# Patient Record
Sex: Female | Born: 1945 | Race: White | Hispanic: No | Smoking: Never smoker
Health system: Southern US, Community
[De-identification: ages and names within clinical notes are randomized; demographics above are authoritative.]

## PROBLEM LIST (undated history)

## (undated) DIAGNOSIS — N6002 Solitary cyst of left breast: Secondary | ICD-10-CM

## (undated) DIAGNOSIS — R2681 Unsteadiness on feet: Secondary | ICD-10-CM

## (undated) DIAGNOSIS — M549 Dorsalgia, unspecified: Secondary | ICD-10-CM

## (undated) DIAGNOSIS — I10 Essential (primary) hypertension: Secondary | ICD-10-CM

## (undated) DIAGNOSIS — H9319 Tinnitus, unspecified ear: Secondary | ICD-10-CM

## (undated) DIAGNOSIS — R9389 Abnormal findings on diagnostic imaging of other specified body structures: Secondary | ICD-10-CM

## (undated) DIAGNOSIS — M5432 Sciatica, left side: Secondary | ICD-10-CM

## (undated) DIAGNOSIS — R110 Nausea: Secondary | ICD-10-CM

## (undated) DIAGNOSIS — K5790 Diverticulosis of intestine, part unspecified, without perforation or abscess without bleeding: Secondary | ICD-10-CM

## (undated) DIAGNOSIS — K224 Dyskinesia of esophagus: Secondary | ICD-10-CM

## (undated) DIAGNOSIS — K2970 Gastritis, unspecified, without bleeding: Secondary | ICD-10-CM

## (undated) DIAGNOSIS — M810 Age-related osteoporosis without current pathological fracture: Secondary | ICD-10-CM

## (undated) DIAGNOSIS — R519 Headache, unspecified: Secondary | ICD-10-CM

## (undated) HISTORY — DX: Headache, unspecified: R51.9

## (undated) HISTORY — DX: Sciatica, left side: M54.32

## (undated) HISTORY — DX: Nausea: R11.0

## (undated) HISTORY — DX: Gastritis, unspecified, without bleeding: K29.70

## (undated) HISTORY — PX: ABDOMINAL HYSTERECTOMY: SHX81

## (undated) HISTORY — DX: Unsteadiness on feet: R26.81

## (undated) HISTORY — DX: Tinnitus, unspecified ear: H93.19

## (undated) HISTORY — DX: Abnormal findings on diagnostic imaging of other specified body structures: R93.89

## (undated) HISTORY — DX: Dorsalgia, unspecified: M54.9

## (undated) HISTORY — DX: Dyskinesia of esophagus: K22.4

## (undated) HISTORY — PX: ESOPHAGEAL MANOMETRY: SHX1526

## (undated) HISTORY — PX: FLEXIBLE SIGMOIDOSCOPY: SHX1649

## (undated) HISTORY — DX: Age-related osteoporosis without current pathological fracture: M81.0

## (undated) HISTORY — DX: Diverticulosis of intestine, part unspecified, without perforation or abscess without bleeding: K57.90

## (undated) HISTORY — DX: Solitary cyst of left breast: N60.02

## (undated) HISTORY — PX: SHOULDER SURGERY: SHX246

## (undated) HISTORY — PX: FOOT SURGERY: SHX648

---

## 1999-10-10 ENCOUNTER — Encounter: Admission: RE | Admit: 1999-10-10 | Discharge: 1999-10-10 | Payer: Self-pay | Admitting: Obstetrics and Gynecology

## 1999-10-10 ENCOUNTER — Encounter: Payer: Self-pay | Admitting: Obstetrics and Gynecology

## 2000-05-23 ENCOUNTER — Other Ambulatory Visit: Admission: RE | Admit: 2000-05-23 | Discharge: 2000-05-23 | Payer: Self-pay | Admitting: Obstetrics and Gynecology

## 2000-06-29 ENCOUNTER — Encounter: Admission: RE | Admit: 2000-06-29 | Discharge: 2000-06-29 | Payer: Self-pay | Admitting: Family Medicine

## 2000-10-12 ENCOUNTER — Encounter: Payer: Self-pay | Admitting: Obstetrics and Gynecology

## 2000-10-12 ENCOUNTER — Encounter: Admission: RE | Admit: 2000-10-12 | Discharge: 2000-10-12 | Payer: Self-pay | Admitting: Obstetrics and Gynecology

## 2000-11-20 ENCOUNTER — Encounter: Admission: RE | Admit: 2000-11-20 | Discharge: 2000-11-20 | Payer: Self-pay | Admitting: Family Medicine

## 2001-05-27 ENCOUNTER — Other Ambulatory Visit: Admission: RE | Admit: 2001-05-27 | Discharge: 2001-05-27 | Payer: Self-pay | Admitting: Obstetrics and Gynecology

## 2001-09-20 ENCOUNTER — Encounter: Payer: Self-pay | Admitting: Gastroenterology

## 2001-09-20 ENCOUNTER — Ambulatory Visit (HOSPITAL_COMMUNITY): Admission: RE | Admit: 2001-09-20 | Discharge: 2001-09-20 | Payer: Self-pay | Admitting: Gastroenterology

## 2001-10-14 ENCOUNTER — Encounter: Payer: Self-pay | Admitting: Obstetrics and Gynecology

## 2001-10-14 ENCOUNTER — Encounter: Admission: RE | Admit: 2001-10-14 | Discharge: 2001-10-14 | Payer: Self-pay | Admitting: Obstetrics and Gynecology

## 2001-10-22 ENCOUNTER — Encounter (INDEPENDENT_AMBULATORY_CARE_PROVIDER_SITE_OTHER): Payer: Self-pay | Admitting: *Deleted

## 2001-10-22 ENCOUNTER — Ambulatory Visit (HOSPITAL_BASED_OUTPATIENT_CLINIC_OR_DEPARTMENT_OTHER): Admission: RE | Admit: 2001-10-22 | Discharge: 2001-10-22 | Payer: Self-pay | Admitting: *Deleted

## 2001-10-28 ENCOUNTER — Ambulatory Visit (HOSPITAL_COMMUNITY): Admission: RE | Admit: 2001-10-28 | Discharge: 2001-10-28 | Payer: Self-pay | Admitting: Gastroenterology

## 2002-10-16 ENCOUNTER — Encounter: Payer: Self-pay | Admitting: Obstetrics and Gynecology

## 2002-10-16 ENCOUNTER — Encounter: Admission: RE | Admit: 2002-10-16 | Discharge: 2002-10-16 | Payer: Self-pay | Admitting: Obstetrics and Gynecology

## 2003-05-07 ENCOUNTER — Ambulatory Visit (HOSPITAL_COMMUNITY): Admission: RE | Admit: 2003-05-07 | Discharge: 2003-05-07 | Payer: Self-pay | Admitting: Gastroenterology

## 2003-05-07 ENCOUNTER — Encounter: Payer: Self-pay | Admitting: Gastroenterology

## 2003-06-22 ENCOUNTER — Ambulatory Visit (HOSPITAL_COMMUNITY): Admission: RE | Admit: 2003-06-22 | Discharge: 2003-06-22 | Payer: Self-pay | Admitting: Gastroenterology

## 2003-06-22 ENCOUNTER — Encounter: Payer: Self-pay | Admitting: Gastroenterology

## 2003-10-27 ENCOUNTER — Encounter: Admission: RE | Admit: 2003-10-27 | Discharge: 2003-10-27 | Payer: Self-pay | Admitting: Obstetrics and Gynecology

## 2004-12-29 ENCOUNTER — Ambulatory Visit (HOSPITAL_COMMUNITY): Admission: RE | Admit: 2004-12-29 | Discharge: 2004-12-29 | Payer: Self-pay | Admitting: Obstetrics and Gynecology

## 2006-01-07 ENCOUNTER — Emergency Department (HOSPITAL_COMMUNITY): Admission: EM | Admit: 2006-01-07 | Discharge: 2006-01-08 | Payer: Self-pay | Admitting: *Deleted

## 2006-01-08 ENCOUNTER — Inpatient Hospital Stay (HOSPITAL_COMMUNITY): Admission: AD | Admit: 2006-01-08 | Discharge: 2006-01-10 | Payer: Self-pay | Admitting: Gastroenterology

## 2006-01-08 ENCOUNTER — Ambulatory Visit: Payer: Self-pay | Admitting: Internal Medicine

## 2006-01-08 ENCOUNTER — Ambulatory Visit: Payer: Self-pay | Admitting: Gastroenterology

## 2006-01-22 ENCOUNTER — Ambulatory Visit: Payer: Self-pay | Admitting: Gastroenterology

## 2006-01-23 ENCOUNTER — Ambulatory Visit (HOSPITAL_COMMUNITY): Admission: RE | Admit: 2006-01-23 | Discharge: 2006-01-23 | Payer: Self-pay | Admitting: Gastroenterology

## 2006-01-23 ENCOUNTER — Encounter (INDEPENDENT_AMBULATORY_CARE_PROVIDER_SITE_OTHER): Payer: Self-pay | Admitting: *Deleted

## 2006-07-04 ENCOUNTER — Ambulatory Visit (HOSPITAL_COMMUNITY): Admission: RE | Admit: 2006-07-04 | Discharge: 2006-07-04 | Payer: Self-pay | Admitting: Family Medicine

## 2006-09-27 ENCOUNTER — Encounter: Admission: RE | Admit: 2006-09-27 | Discharge: 2006-09-27 | Payer: Self-pay | Admitting: Obstetrics and Gynecology

## 2007-10-01 ENCOUNTER — Encounter: Admission: RE | Admit: 2007-10-01 | Discharge: 2007-10-01 | Payer: Self-pay | Admitting: Obstetrics and Gynecology

## 2008-10-05 ENCOUNTER — Encounter: Admission: RE | Admit: 2008-10-05 | Discharge: 2008-10-05 | Payer: Self-pay | Admitting: Obstetrics and Gynecology

## 2009-06-24 ENCOUNTER — Ambulatory Visit (HOSPITAL_COMMUNITY): Admission: RE | Admit: 2009-06-24 | Discharge: 2009-06-24 | Payer: Self-pay | Admitting: Family Medicine

## 2009-06-28 ENCOUNTER — Ambulatory Visit (HOSPITAL_COMMUNITY): Admission: RE | Admit: 2009-06-28 | Discharge: 2009-06-29 | Payer: Self-pay | Admitting: Family Medicine

## 2009-06-30 ENCOUNTER — Encounter (HOSPITAL_COMMUNITY): Admission: RE | Admit: 2009-06-30 | Discharge: 2009-07-30 | Payer: Self-pay | Admitting: Family Medicine

## 2009-06-30 ENCOUNTER — Ambulatory Visit (HOSPITAL_COMMUNITY): Payer: Self-pay | Admitting: Family Medicine

## 2009-07-01 ENCOUNTER — Ambulatory Visit: Payer: Self-pay | Admitting: Cardiology

## 2009-09-29 ENCOUNTER — Encounter (HOSPITAL_COMMUNITY): Admission: RE | Admit: 2009-09-29 | Discharge: 2009-10-29 | Payer: Self-pay | Admitting: Family Medicine

## 2009-09-29 ENCOUNTER — Ambulatory Visit (HOSPITAL_COMMUNITY): Payer: Self-pay | Admitting: Family Medicine

## 2009-10-06 ENCOUNTER — Encounter: Admission: RE | Admit: 2009-10-06 | Discharge: 2009-10-06 | Payer: Self-pay | Admitting: Obstetrics and Gynecology

## 2009-12-22 ENCOUNTER — Encounter (HOSPITAL_COMMUNITY): Admission: RE | Admit: 2009-12-22 | Discharge: 2010-01-21 | Payer: Self-pay | Admitting: Family Medicine

## 2009-12-22 ENCOUNTER — Ambulatory Visit (HOSPITAL_COMMUNITY): Payer: Self-pay | Admitting: Family Medicine

## 2010-03-30 ENCOUNTER — Ambulatory Visit (HOSPITAL_COMMUNITY): Payer: Self-pay | Admitting: Family Medicine

## 2010-03-30 ENCOUNTER — Encounter (HOSPITAL_COMMUNITY): Admission: RE | Admit: 2010-03-30 | Discharge: 2010-04-29 | Payer: Self-pay | Admitting: Family Medicine

## 2010-06-29 ENCOUNTER — Encounter (HOSPITAL_COMMUNITY): Admission: RE | Admit: 2010-06-29 | Discharge: 2010-07-29 | Payer: Self-pay | Admitting: Family Medicine

## 2010-06-29 ENCOUNTER — Ambulatory Visit (HOSPITAL_COMMUNITY): Payer: Self-pay | Admitting: Family Medicine

## 2010-09-28 ENCOUNTER — Encounter (HOSPITAL_COMMUNITY)
Admission: RE | Admit: 2010-09-28 | Discharge: 2010-10-28 | Payer: Self-pay | Source: Home / Self Care | Admitting: Family Medicine

## 2010-09-28 ENCOUNTER — Ambulatory Visit (HOSPITAL_COMMUNITY): Payer: Self-pay | Admitting: Family Medicine

## 2010-10-26 ENCOUNTER — Encounter: Admission: RE | Admit: 2010-10-26 | Discharge: 2010-10-26 | Payer: Self-pay | Admitting: Obstetrics and Gynecology

## 2010-12-22 ENCOUNTER — Encounter (INDEPENDENT_AMBULATORY_CARE_PROVIDER_SITE_OTHER): Payer: Self-pay | Admitting: *Deleted

## 2010-12-28 ENCOUNTER — Encounter (HOSPITAL_COMMUNITY)
Admission: RE | Admit: 2010-12-28 | Discharge: 2011-01-10 | Payer: Self-pay | Source: Home / Self Care | Attending: Family Medicine | Admitting: Family Medicine

## 2010-12-28 ENCOUNTER — Ambulatory Visit (HOSPITAL_COMMUNITY)
Admission: RE | Admit: 2010-12-28 | Discharge: 2011-01-10 | Payer: Self-pay | Source: Home / Self Care | Attending: Family Medicine | Admitting: Family Medicine

## 2011-01-01 ENCOUNTER — Encounter: Payer: Self-pay | Admitting: Chiropractic Medicine

## 2011-01-12 NOTE — Letter (Signed)
Summary: Pre Visit Letter Revised  [facility]  [address] [place], [place] [address]   Phone: [phone]  Fax: [phone]        12/22/2010 MRN: [id number] Shelly Stein [address] [place], [place]  [address]             Procedure Date:  02/10/2011 @ 10:00   Recall colon-Dr. Juanda Chance   Welcome to the Gastroenterology Division at [facility].    You are scheduled to see a nurse for your pre-procedure visit on 01/26/2011 at 10:00 on the 3rd floor at [facility], [address]  We ask that you try to arrive at our office 15 minutes prior to your appointment time to allow for check-in.  Please take a minute to review the attached form.  If you answer "Yes" to one or more of the questions on the first page, we ask that you call the person listed at your earliest opportunity.  If you answer "No" to all of the questions, please complete the rest of the form and bring it to your appointment.    Your nurse visit will consist of discussing your medical and surgical history, your immediate family medical history, and your medications.   If you are unable to list all of your medications on the form, please bring the medication bottles to your appointment and we will list them.  We will need to be aware of both prescribed and over the counter drugs.  We will need to know exact dosage information as well.    Please be prepared to read and sign documents such as consent forms, a financial agreement, and acknowledgement forms.  If necessary, and with your consent, a friend or relative is welcome to sit-in on the nurse visit with you.  Please bring your insurance card so that we may make a copy of it.  If your insurance requires a referral to see a specialist, please bring your referral form from your primary care physician.  No co-pay is required for this nurse visit.     If you cannot keep your appointment, please call [phone] to cancel or reschedule prior to your  appointment date.  This allows [place] the opportunity to schedule an appointment for another patient in need of care.    Thank you for choosing [facility] for your medical needs.  We appreciate the opportunity to care for you.  Please visit [place] at our website  to learn more about our practice.  Sincerely, The Gastroenterology Division

## 2011-01-24 ENCOUNTER — Encounter (INDEPENDENT_AMBULATORY_CARE_PROVIDER_SITE_OTHER): Payer: Self-pay | Admitting: *Deleted

## 2011-01-26 ENCOUNTER — Encounter: Payer: Self-pay | Admitting: Internal Medicine

## 2011-02-01 NOTE — Miscellaneous (Signed)
Summary: [facility]  Clinical Lists Changes  Medications: Added new medication of MIRALAX   POWD (POLYETHYLENE GLYCOL 3350) As per prep  instructions. - Signed Added new medication of DULCOLAX 5 MG  TBEC (BISACODYL) Day before procedure take 2 at 3pm and 2 at 8pm. - Signed Added new medication of REGLAN 10 MG  TABS (METOCLOPRAMIDE HCL) As per prep instructions. - Signed Rx of MIRALAX   POWD (POLYETHYLENE GLYCOL 3350) As per prep  instructions.;  #255gm x 0;  Signed;  Entered by: Ezra Sites RN;  Authorized by: Hart Carwin MD;  Method used: Electronically to [facility] # [phone]*, [address] [place], [place]  [address], Ph: [phone] or [phone], Fax: [phone] Rx of DULCOLAX 5 MG  TBEC (BISACODYL) Day before procedure take 2 at 3pm and 2 at 8pm.;  #4 x 0;  Signed;  Entered by: Ezra Sites RN;  Authorized by: Hart Carwin MD;  Method used: Electronically to [facility] # [phone]*, [address], [place], [place]  [address], Ph: [phone] or [phone], Fax: [phone] Rx of REGLAN 10 MG  TABS (METOCLOPRAMIDE HCL) As per prep instructions.;  #2 x 0;  Signed;  Entered by: Ezra Sites RN;  Authorized by: Hart Carwin MD;  Method used: Electronically to [facility] # [phone]*, [address], [place], [place]  [address], Ph: [phone] or [phone], Fax: [phone] Observations: Added new observation of NKA: T (01/26/2011 9:48)    Prescriptions: REGLAN 10 MG  TABS (METOCLOPRAMIDE HCL) As per prep instructions.  #2 x 0   Entered by:   Ezra Sites RN   Authorized by:   Hart Carwin MD   Signed by:   Ezra Sites RN on 01/26/2011   Method used:   Electronically to        [facility] # [phone]* (retail)       [address]       [place], [place]  [address]       Ph: [phone] or [phone]       Fax: [phone]   RxID:   [id number] DULCOLAX 5 MG  TBEC (BISACODYL) Day before procedure take 2 at 3pm and 2 at 8pm.  #4 x 0  Entered by:   Ezra Sites RN   Authorized by:   Hart Carwin MD   Signed by:   Ezra Sites RN on 01/26/2011   Method used:   Electronically to        [facility] # [phone]* (retail)       [address]       [place], [place]  [address]       Ph: [phone] or [phone]       Fax: [phone]   RxID:   [id number] MIRALAX   POWD (POLYETHYLENE GLYCOL 3350) As per prep  instructions.  #255gm x 0   Entered by:   Ezra Sites RN   Authorized by:   Hart Carwin MD   Signed by:   Ezra Sites RN on 01/26/2011   Method used:   Electronically to        [facility] # [phone]* (retail)       [address]       [place], [place]  [address]  Ph: [phone] or [phone]       Fax: [phone]   RxID:   [id number]

## 2011-02-01 NOTE — Letter (Signed)
Summary: [facility] Instructions  [facility]  [address] [place], [place] [address]   Phone: [phone]  Fax: [phone]       BRE PECINA    Nov 06, 1946    MRN: [id number]       Procedure Day Dorna Bloom:  Farrell Ours  02/10/11     Arrival Time:  9:00am     Procedure Time: 10:00am     Location of Procedure:                    [facility] (4th Floor)    PREPARATION FOR COLONOSCOPY WITH MIRALAX  Starting 5 days prior to your procedure  SUNDAY 02/26 do not eat nuts, seeds, popcorn, corn, beans, peas,  salads, or any raw vegetables.  Do not take any fiber supplements (e.g. Metamucil, Citrucel, and Benefiber). ____________________________________________________________________________________________________   THE DAY BEFORE YOUR PROCEDURE         DATE: THURSDAY  03/01  1   Drink clear liquids the entire day-NO SOLID FOOD  2   Do not drink anything colored red or purple.  Avoid juices with pulp.  No orange juice.  3   Drink at least 64 oz. (8 glasses) of fluid/clear liquids during the day to prevent dehydration and help the prep work efficiently.  CLEAR LIQUIDS INCLUDE: Water Jello Ice Popsicles Tea (sugar ok, no milk/cream) Powdered fruit flavored drinks Coffee (sugar ok, no milk/cream) Gatorade Juice: apple, white grape, white cranberry  Lemonade Clear bullion, consomm, broth Carbonated beverages (any kind) Strained chicken noodle soup Hard Candy  4   Mix the entire bottle of Miralax with 64 oz. of Gatorade/Powerade in the morning and put in the refrigerator to chill.  5   At 3:00 pm take 2 Dulcolax/Bisacodyl tablets.  6   At 4:30 pm take one Reglan/Metoclopramide tablet.  7  Starting at 5:00 pm drink one 8 oz glass of the Miralax mixture every 15-20 minutes until you have finished drinking the entire 64 oz.  You should finish drinking prep around 7:30 or 8:00 pm.  8   If you are nauseated, you may take the 2nd Reglan/Metoclopramide  tablet at 6:30 pm.        9    At 8:00 pm take 2 more DULCOLAX/Bisacodyl tablets.     THE DAY OF YOUR PROCEDURE      DATE:  Farrell Ours 03/02  You may drink clear liquids until  8:00am   (2 HOURS BEFORE PROCEDURE).   MEDICATION INSTRUCTIONS  Unless otherwise instructed, you should take regular prescription medications with a small sip of water as early as possible the morning of your procedure.           OTHER INSTRUCTIONS  You will need a responsible adult at least 65 years of age to accompany you and drive you home.   This person must remain in the waiting room during your procedure.  Wear loose fitting clothing that is easily removed.  Leave jewelry and other valuables at home.  However, you may wish to bring a book to read or an iPod/MP3 player to listen to music as you wait for your procedure to start.  Remove all body piercing jewelry and leave at home.  Total time from sign-in until discharge is approximately 2-3 hours.  You should go home directly after your procedure and rest.  You can resume normal activities the day after your procedure.  The day of your procedure you should not:  Drive   Make legal decisions   Operate machinery   Drink alcohol   Return to work  You will receive specific instructions about eating, activities and medications before you leave.   The above instructions have been reviewed and explained to me by   Ezra Sites RN  January 26, 2011 10:16 AM     I fully understand and can verbalize these instructions _____________________________ Date _______

## 2011-02-10 ENCOUNTER — Encounter: Payer: Self-pay | Admitting: Internal Medicine

## 2011-02-10 ENCOUNTER — Other Ambulatory Visit (AMBULATORY_SURGERY_CENTER): Payer: BC Managed Care – PPO | Admitting: Internal Medicine

## 2011-02-10 DIAGNOSIS — Z1211 Encounter for screening for malignant neoplasm of colon: Secondary | ICD-10-CM

## 2011-02-10 DIAGNOSIS — K5730 Diverticulosis of large intestine without perforation or abscess without bleeding: Secondary | ICD-10-CM

## 2011-02-10 DIAGNOSIS — K648 Other hemorrhoids: Secondary | ICD-10-CM

## 2011-02-16 NOTE — Procedures (Addendum)
Summary: Colonoscopy  Patient: Shelly Stein Note: All result statuses are Final unless otherwise noted.  Tests: (1) Colonoscopy (COL)   COL Colonoscopy           DONE (C)      [facility]     [address]     [place], [place]  [address]          COLONOSCOPY PROCEDURE REPORT          PATIENT:  Shelly Stein, Shelly Stein  MR#:  [id number]     BIRTHDATE:  1946/01/12, 64 yrs. old  GENDER:  female     ENDOSCOPIST:  Hedwig Morton. Juanda Chance, MD     REF. BY:  Lilyan Punt, M.D.     PROCEDURE DATE:  02/10/2011     PROCEDURE:  Colonoscopy [address]     ASA CLASS:  Class II     INDICATIONS:  colorectal cancer screening, average risk hx     ischemic colitis 2007, prior colon 2004     MEDICATIONS:   Versed 9 mg, Fentanyl 100 mcg IV          DESCRIPTION OF PROCEDURE:   After the risks benefits and     alternatives of the procedure were thoroughly explained, informed     consent was obtained.  Digital rectal exam was performed and     revealed no rectal masses.   The LB PCF-H180AL [id number] endoscope     was introduced through the anus and advanced to the cecum, which     was identified by both the appendix and ileocecal valve, without     limitations.  The quality of the prep was good, using MiraLax.     The instrument was then slowly withdrawn as the colon was fully     examined.     <<PROCEDUREIMAGES>>          FINDINGS:  Internal Hemorrhoids were found (see image6 and     image7).  Mild diverticulosis was found in the sigmoid colon (see     image5 and image1).  This was otherwise a normal examination of     the colon (see image4, image3, and image2).   Retroflexed views in     the rectum revealed no abnormalities.    The scope was then     withdrawn from the patient and the procedure completed.          COMPLICATIONS:  None     ENDOSCOPIC IMPRESSION:     1) Internal hemorrhoids     2) Mild diverticulosis in the sigmoid colon     3) Otherwise normal examination     RECOMMENDATIONS:     1) high  fiber diet     REPEAT EXAM:  In 10 year(s) for.          ______________________________     Hedwig Morton. Juanda Chance, MD          CC:          n.     REVISED:  02/13/2011 11:39 AM     eSIGNED:   Hedwig Morton. Brodie at 02/13/2011 11:39 AM          Montez Morita, [id number]  Note: An exclamation mark (!) indicates a result that was not dispersed into the flowsheet. Document Creation Date: 02/13/2011 11:39 AM _______________________________________________________________________  (1) Order result status: Final Collection or observation date-time: 02/10/2011 10:49 Requested date-time:  Receipt date-time:  Reported date-time:  Referring Physician:   Ordering Physician: Lina Sar [phone]) Specimen  Source:  Source: Launa Grill Order Number: [id number] Lab site:

## 2011-03-29 ENCOUNTER — Encounter (HOSPITAL_COMMUNITY): Payer: BC Managed Care – PPO

## 2011-03-29 ENCOUNTER — Encounter (HOSPITAL_COMMUNITY): Payer: BC Managed Care – PPO | Attending: Oncology

## 2011-03-29 DIAGNOSIS — M818 Other osteoporosis without current pathological fracture: Secondary | ICD-10-CM | POA: Insufficient documentation

## 2011-04-21 ENCOUNTER — Other Ambulatory Visit: Payer: Self-pay | Admitting: Family Medicine

## 2011-04-28 NOTE — H&P (Signed)
NAMECHARLCIE, PRISCO NO.:  [id number]   MEDICAL RECORD NO.:  [id number]          PATIENT TYPE:  INP   LOCATION:  6737                         FACILITY:  [facility]   PHYSICIAN:  Ulyess Mort, M.D. LHCDATE OF BIRTH:  1946-02-04   DATE OF ADMISSION:  01/08/2006  DATE OF DISCHARGE:                                HISTORY & PHYSICAL   CHIEF COMPLAINT:  Abdominal pain and rectal bleeding.   HISTORY:  Shelly Stein is a pleasant 64 year old white female generally in good  health, though with history of hypertension.  She had colonoscopy with Dr.  Victorino Dike May of 2004 due to family history of colon cancer in an aunt.  This was a normal examination.  Patient became acutely ill on Saturday,  January 27 at about 8 p.m. with nausea, vomiting, and diarrhea.  She said  she had some abdominal cramping, but nothing significant, but multiple  episodes of stools through the night and then by 4 a.m. on Sunday had  started passing bloody stool.  She went to the emergency room on January 28  at [facility] and was noted to have hemoglobin of 14.5, potassium of 3.3.  She was released as she was not having any active bleeding at that time.  Last evening she passed more blood after returning home and then today has  had three episodes of mostly bloody stools.  She has had no further nausea,  vomiting, is able to take liquids at this time.  Does complain of fairly  severe abdominal crampy pain.  She was seen and evaluated in the office by  Dr. Victorino Dike, felt to be acutely ill and admitted for hydration and  further diagnostic evaluation with concern for a segmental ischemic colitis.   CURRENT MEDICATIONS:  1.  Amitriptyline 40 mg daily.  2.  Zestril 2.5 daily.   ALLERGIES:  No known drug allergies.   PAST HISTORY:  1.  Hypertension.  2.  Headaches.  3.  Hysterectomy.   SOCIAL HISTORY:  Patient is widowed.  Lives alone.  She is employed as an  Environmental health practitioner.  No tobacco.   No ETOH.   FAMILY HISTORY:  Positive for colon cancer in an aunt and coronary artery  disease.   PHYSICAL EXAMINATION:  GENERAL:  Well-developed white female in no acute  distress.  She is pale, alert and oriented.  VITAL SIGNS:  Blood pressure 96/50, pulse in the 80s.  HEENT:  Non-traumatic, normocephalic.  EOMI.  PERLA.  Sclerae anicteric.  SKIN:  Pale.  CARDIOVASCULAR:  Regular rate and rhythm with S1 and S2.  PULMONARY:  Clear to A&P.  ABDOMEN:  Soft and nontender.  She has left mid quadrant, left lower  quadrant tenderness across the lower abdomen.  Bowel sounds are active.  There is no guarding or rebound.  RECTAL:  Blood on the glove.   IMPRESSION:  30.  65 year old female with acute colitis, probably ischemic superimposed on      a gastroenteritis.  2.  History of hypertension.  3.  Hypokalemia secondary to #1.  4.  Status post  hysterectomy.   PLAN:  Patient is admitted to the service of Dr. Victorino Dike for IV fluid  hydration, serial CBCs, CT scan of the abdomen and pelvis.  Will hold her  Zestril for now and plan to replace her potassium.  For details please see  the orders.      Mike Gip, P.A.-C. [facility]    ______________________________  Ulyess Mort, M.D. [facility]    AE/MEDQ  D:  01/09/2006  T:  01/09/2006  Job:  [id number]

## 2011-04-28 NOTE — Consult Note (Signed)
NAME:  TORRIN, FREIN NO.:  [id number]   MEDICAL RECORD NO.:  [id number]          PATIENT TYPE:  EMS   LOCATION:  MAJO                         FACILITY:  [facility]   PHYSICIAN:  Georgiana Spinner, M.D.    DATE OF BIRTH:  06/08/1946   DATE OF CONSULTATION:  DATE OF DISCHARGE:  01/08/2006                                   CONSULTATION   TELEPHONE CONVERSATION:  Ms. Dye called Sunday afternoon stating that she  had some nausea and vomiting. She vomited up some pinkish material earlier  today but subsequently has been having diarrhea, had loose stools last night  but subsequently all day today she has been passing a cupful of bright red  blood per rectum and I suggested she be seen in the emergency room for  further evaluation. She was going to go to [facility] since she was residing  currently in [place] and that was much more convenient for her as a first step.           ______________________________  Georgiana Spinner, M.D.     GMO/MEDQ  D:  01/07/2006  T:  01/08/2006  Job:  [id number]   cc:   Ulyess Mort, M.D. [facility]  [address]  [place]  [place] [address]

## 2011-04-28 NOTE — Discharge Summary (Signed)
NAMEKHRISTY, KALAN NO.:  [id number]   MEDICAL RECORD NO.:  [id number]          PATIENT TYPE:  INP   LOCATION:  6737                         FACILITY:  [facility]   PHYSICIAN:  Lina Sar, M.D. [facility]  DATE OF BIRTH:  [birth year]   DATE OF ADMISSION:  01/08/2006  DATE OF DISCHARGE:  01/10/2006                               DISCHARGE SUMMARY   ADMITTING DIAGNOSES:  1. Acute colitis.  Suspect ischemic etiology, rule out      gastroenteritis, rule out other infectious colitis. Less likely      this represents some sort of chronic inflammatory bowel disease.  2. Status post normal colonoscopy in May 2004.  There is a family      history of colon cancer.  3. Hypertension.  4. Headaches.  5. Status post hysterectomy.  6. Hypokalemia.   DISCHARGE DIAGNOSES:  1. Segmental colitis, probably ischemic, symptoms resolving.  2. Hypokalemia, resolved.  3. Indeterminate hepatic lesions.  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   BRIEF HISTORY:  Mrs. Harten is a pleasant 65 year old white female.  She  has undergone unremarkable colonoscopy in 2004.  A couple days prior to  this admission she developed acute nausea, vomiting and diarrhea and  some abdominal cramping.  The following day, in addition to diarrhea,  she started having bloody stools.  She came to the [facility] ER and  hemoglobin was 14.5 and potassium was 3.3.  She was no longer having any  active bleeding and was discharged to home.  That evening she developed  further bloody stools and some fairly severe crampy abdominal pain but  no further nausea and vomiting.  She was evaluated at Dr. Richarda Osmond  office and he arranged admission for further evaluation and supportive  care.   CONSULTATIONS:  None.   PROCEDURE:  None.   LABORATORY:  Hemoglobin 13.5 on admission, 11.8 at discharge.  Hematocrit 34.5 at discharge.  White blood cell count maximum 10.1, 5.6  at discharge.  Platelets 171,000.   Sodium initially 133, corrected to  138.  Potassium 3.1, corrected to 4.2.  Glucose ranging 103 to 107.  BUN  8, creatinine 0.7.   IMAGING STUDIES:  CT scan of the abdomen and pelvis showing mild  inflammation of the descending colon consistent with colitis.  There  were some indeterminate hepatic lesions possibly representing  hemangiomas.  There was a 1.3 x 1.1 left adrenal adenoma.  Overall, the  impression was that of mild descending colitis.   HOSPITAL COURSE:  The patient was admitted to a regular bed at [facility].  Vital signs were stable though the blood pressure was a bit  low.  At one point it was 89/55.  She was hydrated with D5-half-normal  saline to which 20 mEq of potassium was added.  Morphine was used for  pain control.   The following day the cramping pain had improved.  She had not had any  further bloody stools.  Diet was slowly advanced.  Ultimately, she was  tolerating a low-residue diet.  Her  potassium level corrected.  On  hospital day #2-and-a-half she was stable and discharged to home in  stable condition.  She had followup office visit with Dr. Victorino Dike on  February 12.  Recommended diet was low residue.   Medications at discharge were:  1. Amitriptyline 40 mg daily.  2. Robinul Forte 2 mg one to two times daily p.r.n. cramping pain.  3. For the time-being her Zestril 2.5 mg daily was to be held due to      ongoing low though asymptomatic blood pressures.      Jennye Moccasin, PA-C      ______________________________  Lina Sar, M.D. [facility]    SG/MEDQ  D:  06/20/2006  T:  06/20/2006  Job:  [id number]

## 2011-04-28 NOTE — Procedures (Signed)
[place]  Patient:    Shelly Stein, Shelly Stein Visit Number: [id number] MRN: [id number]          Service Type: END Location: ENDO Attending Physician:  Charmaine Downs Dictated by:   Vania Rea. Jarold Motto, M.D. [facility] Proc. Date: 10/28/01 Admit Date:  10/28/2001 Discharge Date: 10/28/2001   CC:         Ulyess Mort, M.D. [facility]                           Procedure Report  PROCEDURE:  Esophageal manometry.  FINDINGS:  Ms. Coletta is a patient of Dr. Ulyess Mort who is being evaluated for dysphagia and was set up for esophageal manometry.  Esophageal manometry was completed on October 28, 2001, as follows:  1. Upper esophageal sphincter:  There appears to be normal coordination    between pharyngeal contraction and cricopharyngeal relaxation. 2. Lower esophageal sphincter:  Mean pressure is normal at 25 mmHg with    normal relaxation with swallowing. 3. Motility pattern:  There are high-amplitude (mean amplitude 222 mmHg)    peristaltic contractions throughout the length of the esophagus.  However    some of these contractions are greater than six seconds in duration and    are double-peaked, consistent with esophageal spasm.  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  RECOMMENDATIONS:  This patient should respond to a combination of trials of anticholinergics, antispasmodics, sedatives, perhaps nitroglycerin, and perhaps calcium channel blockers.  She should see Dr. Terrial Rhodes in follow-up for appropriate therapy. Dictated by:   Vania Rea. Jarold Motto, M.D. [facility] Attending Physician:  Charmaine Downs DD:  11/01/19 TD:  10/31/01 Job: [id number]

## 2011-05-12 ENCOUNTER — Other Ambulatory Visit (HOSPITAL_COMMUNITY): Payer: BC Managed Care – PPO

## 2011-06-28 ENCOUNTER — Encounter (HOSPITAL_COMMUNITY): Payer: BC Managed Care – PPO | Attending: Family Medicine

## 2011-06-28 DIAGNOSIS — M810 Age-related osteoporosis without current pathological fracture: Secondary | ICD-10-CM

## 2011-06-28 MED ORDER — SODIUM CHLORIDE 0.9 % IJ SOLN
INTRAMUSCULAR | Status: AC
  Filled 2011-06-28: qty 10

## 2011-06-28 MED ORDER — IBANDRONATE SODIUM 3 MG/3ML IV SOLN
3.0000 mg | Freq: Once | INTRAVENOUS | Status: AC
  Administered 2011-06-28: 3 mg via INTRAVENOUS

## 2011-06-28 MED ORDER — IBANDRONATE SODIUM 3 MG/3ML IV SOLN
INTRAVENOUS | Status: AC
  Filled 2011-06-28: qty 3

## 2011-07-20 ENCOUNTER — Other Ambulatory Visit (HOSPITAL_COMMUNITY): Payer: BC Managed Care – PPO

## 2011-08-03 ENCOUNTER — Other Ambulatory Visit (HOSPITAL_COMMUNITY): Payer: BC Managed Care – PPO

## 2011-08-10 ENCOUNTER — Ambulatory Visit (HOSPITAL_COMMUNITY)
Admission: RE | Admit: 2011-08-10 | Discharge: 2011-08-10 | Disposition: A | Discharge status: Home / Self Care | Payer: BC Managed Care – PPO | Source: Ambulatory Visit | Attending: Family Medicine | Admitting: Family Medicine

## 2011-08-10 DIAGNOSIS — M949 Disorder of cartilage, unspecified: Secondary | ICD-10-CM | POA: Insufficient documentation

## 2011-08-10 DIAGNOSIS — M899 Disorder of bone, unspecified: Secondary | ICD-10-CM | POA: Insufficient documentation

## 2011-08-10 DIAGNOSIS — Z780 Asymptomatic menopausal state: Secondary | ICD-10-CM | POA: Insufficient documentation

## 2011-09-27 ENCOUNTER — Encounter (HOSPITAL_COMMUNITY): Payer: BC Managed Care – PPO | Attending: Family Medicine

## 2011-09-27 DIAGNOSIS — M818 Other osteoporosis without current pathological fracture: Secondary | ICD-10-CM | POA: Insufficient documentation

## 2011-09-27 MED ORDER — IBANDRONATE SODIUM 3 MG/3ML IV SOLN
INTRAVENOUS | Status: AC
  Administered 2011-09-27: 3 mg via INTRAVENOUS
  Filled 2011-09-27: qty 3

## 2011-09-27 MED ORDER — IBANDRONATE SODIUM 3 MG/3ML IV SOLN
3.0000 mg | Freq: Once | INTRAVENOUS | Status: AC
  Administered 2011-09-27: 3 mg via INTRAVENOUS

## 2011-09-27 NOTE — Progress Notes (Signed)
Tolerated boniva inj well.

## 2011-10-27 ENCOUNTER — Other Ambulatory Visit: Payer: Self-pay | Admitting: Obstetrics and Gynecology

## 2011-10-27 DIAGNOSIS — Z1231 Encounter for screening mammogram for malignant neoplasm of breast: Secondary | ICD-10-CM

## 2011-11-23 ENCOUNTER — Ambulatory Visit
Admission: RE | Admit: 2011-11-23 | Discharge: 2011-11-23 | Disposition: A | Discharge status: Home / Self Care | Payer: BC Managed Care – PPO | Source: Ambulatory Visit | Attending: Obstetrics and Gynecology | Admitting: Obstetrics and Gynecology

## 2011-11-23 DIAGNOSIS — Z1231 Encounter for screening mammogram for malignant neoplasm of breast: Secondary | ICD-10-CM

## 2011-12-28 ENCOUNTER — Encounter (HOSPITAL_COMMUNITY): Payer: BC Managed Care – PPO | Attending: Family Medicine

## 2011-12-28 DIAGNOSIS — M810 Age-related osteoporosis without current pathological fracture: Secondary | ICD-10-CM | POA: Insufficient documentation

## 2011-12-28 MED ORDER — IBANDRONATE SODIUM 3 MG/3ML IV SOLN
3.0000 mg | Freq: Once | INTRAVENOUS | Status: AC
  Administered 2011-12-28: 3 mg via INTRAVENOUS

## 2011-12-28 NOTE — Progress Notes (Signed)
Boniva 3mg  given via IVP with a 25g butterfly needle in the left AC.

## 2012-03-27 ENCOUNTER — Encounter (HOSPITAL_COMMUNITY): Payer: BC Managed Care – PPO | Attending: Family Medicine

## 2012-03-27 DIAGNOSIS — M810 Age-related osteoporosis without current pathological fracture: Secondary | ICD-10-CM | POA: Insufficient documentation

## 2012-03-27 MED ORDER — IBANDRONATE SODIUM 3 MG/3ML IV SOLN
3.0000 mg | Freq: Once | INTRAVENOUS | Status: AC
  Administered 2012-03-27: 3 mg via INTRAVENOUS

## 2012-03-27 NOTE — Progress Notes (Signed)
Shelly Stein presents today for injection per MD orders. Boniva 3mg  administered IV in left arm. Administration without incident. Patient tolerated well.

## 2012-06-26 ENCOUNTER — Encounter (HOSPITAL_COMMUNITY): Payer: BC Managed Care – PPO | Attending: Family Medicine

## 2012-06-26 VITALS — BP 118/64 | HR 80 | Temp 98.5°F

## 2012-06-26 DIAGNOSIS — M810 Age-related osteoporosis without current pathological fracture: Secondary | ICD-10-CM | POA: Insufficient documentation

## 2012-06-26 MED ORDER — IBANDRONATE SODIUM 3 MG/3ML IV SOLN
3.0000 mg | Freq: Once | INTRAVENOUS | Status: AC
  Administered 2012-06-26: 3 mg via INTRAVENOUS

## 2012-06-26 NOTE — Progress Notes (Signed)
Tolerated IV Boniva well.

## 2012-09-16 ENCOUNTER — Other Ambulatory Visit: Payer: Self-pay | Admitting: Family Medicine

## 2012-09-16 ENCOUNTER — Ambulatory Visit (HOSPITAL_COMMUNITY)
Admission: RE | Admit: 2012-09-16 | Discharge: 2012-09-16 | Disposition: A | Discharge status: Home / Self Care | Payer: BC Managed Care – PPO | Source: Ambulatory Visit | Attending: Family Medicine | Admitting: Family Medicine

## 2012-09-16 DIAGNOSIS — R05 Cough: Secondary | ICD-10-CM | POA: Insufficient documentation

## 2012-09-16 DIAGNOSIS — R059 Cough, unspecified: Secondary | ICD-10-CM | POA: Insufficient documentation

## 2012-09-16 DIAGNOSIS — I10 Essential (primary) hypertension: Secondary | ICD-10-CM | POA: Insufficient documentation

## 2012-09-16 DIAGNOSIS — R0989 Other specified symptoms and signs involving the circulatory and respiratory systems: Secondary | ICD-10-CM | POA: Insufficient documentation

## 2012-09-25 ENCOUNTER — Ambulatory Visit (HOSPITAL_COMMUNITY): Payer: BC Managed Care – PPO

## 2012-10-09 ENCOUNTER — Ambulatory Visit (HOSPITAL_COMMUNITY): Payer: BC Managed Care – PPO

## 2012-10-11 ENCOUNTER — Other Ambulatory Visit: Payer: Self-pay | Admitting: Obstetrics and Gynecology

## 2012-10-11 DIAGNOSIS — Z1231 Encounter for screening mammogram for malignant neoplasm of breast: Secondary | ICD-10-CM

## 2012-10-16 ENCOUNTER — Encounter (HOSPITAL_COMMUNITY): Payer: BC Managed Care – PPO | Attending: Family Medicine

## 2012-10-16 DIAGNOSIS — M810 Age-related osteoporosis without current pathological fracture: Secondary | ICD-10-CM | POA: Insufficient documentation

## 2012-10-16 MED ORDER — SODIUM CHLORIDE 0.9 % IJ SOLN
10.0000 mL | Freq: Once | INTRAMUSCULAR | Status: AC
  Administered 2012-10-16: 10 mL via INTRAVENOUS

## 2012-10-16 MED ORDER — IBANDRONATE SODIUM 3 MG/3ML IV SOLN
3.0000 mg | Freq: Once | INTRAVENOUS | Status: AC
  Administered 2012-10-16: 3 mg via INTRAVENOUS

## 2012-10-16 NOTE — Progress Notes (Signed)
Shelly Stein tolerated boniva injection well and w/o incident; patient is scheduled to return in 3 months as per Dr. Satira Sark. Luking's order.

## 2012-11-26 ENCOUNTER — Ambulatory Visit: Payer: BC Managed Care – PPO

## 2012-11-29 ENCOUNTER — Ambulatory Visit: Payer: BC Managed Care – PPO

## 2012-12-17 ENCOUNTER — Ambulatory Visit
Admission: RE | Admit: 2012-12-17 | Discharge: 2012-12-17 | Disposition: A | Discharge status: Home / Self Care | Payer: BC Managed Care – PPO | Source: Ambulatory Visit | Attending: Obstetrics and Gynecology | Admitting: Obstetrics and Gynecology

## 2012-12-17 DIAGNOSIS — Z1231 Encounter for screening mammogram for malignant neoplasm of breast: Secondary | ICD-10-CM

## 2013-01-16 ENCOUNTER — Encounter (HOSPITAL_COMMUNITY): Payer: BC Managed Care – PPO | Attending: Family Medicine

## 2013-01-16 VITALS — BP 135/79 | HR 80

## 2013-01-16 DIAGNOSIS — M810 Age-related osteoporosis without current pathological fracture: Secondary | ICD-10-CM | POA: Insufficient documentation

## 2013-01-16 MED ORDER — IBANDRONATE SODIUM 3 MG/3ML IV SOLN
3.0000 mg | Freq: Once | INTRAVENOUS | Status: AC
  Administered 2013-01-16: 3 mg via INTRAVENOUS

## 2013-01-16 NOTE — Progress Notes (Signed)
Boniva given via IV push.  Tolerated well.

## 2013-03-20 ENCOUNTER — Ambulatory Visit (HOSPITAL_COMMUNITY): Payer: BC Managed Care – PPO

## 2013-04-18 ENCOUNTER — Ambulatory Visit (HOSPITAL_COMMUNITY): Payer: BC Managed Care – PPO

## 2013-05-08 ENCOUNTER — Telehealth: Payer: Self-pay | Admitting: Family Medicine

## 2013-05-08 NOTE — Telephone Encounter (Signed)
Patient needs a refill of Boniva and faxed to [phone] to [facility]

## 2013-05-09 NOTE — Telephone Encounter (Signed)
Order faxed to [facility] for IV Boniva 3mg  q 3 months -pt with osteoporosis and unable to tolerate oral meds due to severe GERD

## 2013-05-13 ENCOUNTER — Other Ambulatory Visit: Payer: Self-pay | Admitting: *Deleted

## 2013-05-13 DIAGNOSIS — Z79899 Other long term (current) drug therapy: Secondary | ICD-10-CM

## 2013-05-14 ENCOUNTER — Other Ambulatory Visit: Payer: Self-pay | Admitting: Family Medicine

## 2013-05-15 ENCOUNTER — Encounter: Payer: Self-pay | Admitting: Family Medicine

## 2013-05-15 ENCOUNTER — Ambulatory Visit (HOSPITAL_COMMUNITY): Payer: BC Managed Care – PPO

## 2013-05-15 ENCOUNTER — Ambulatory Visit (HOSPITAL_COMMUNITY)
Admission: RE | Admit: 2013-05-15 | Discharge: 2013-05-15 | Disposition: A | Discharge status: Home / Self Care | Payer: BC Managed Care – PPO | Source: Ambulatory Visit | Attending: Family Medicine | Admitting: Family Medicine

## 2013-05-15 DIAGNOSIS — M810 Age-related osteoporosis without current pathological fracture: Secondary | ICD-10-CM | POA: Insufficient documentation

## 2013-05-15 LAB — BASIC METABOLIC PANEL
BUN: 14 mg/dL (ref 6–23)
Chloride: 101 mEq/L (ref 96–112)
Creat: 0.68 mg/dL (ref 0.50–1.10)
Glucose, Bld: 92 mg/dL (ref 70–99)

## 2013-05-15 MED ORDER — IBANDRONATE SODIUM 3 MG/3ML IV SOLN
3.0000 mg | Freq: Once | INTRAVENOUS | Status: AC
  Administered 2013-05-15: 3 mg via INTRAVENOUS
  Filled 2013-05-15: qty 3

## 2013-05-15 NOTE — Progress Notes (Signed)
NS 5 ml IV flush, Boniva 3mg , IV NS 5ml flush for osteoporosis unable to tolerate oral meds severe GERD. Tolerated well.

## 2013-08-06 ENCOUNTER — Telehealth: Payer: Self-pay | Admitting: Family Medicine

## 2013-08-06 MED ORDER — LISINOPRIL 5 MG PO TABS: 2.5000 mg | ORAL_TABLET | Freq: Every day | ORAL | Status: DC

## 2013-08-06 NOTE — Telephone Encounter (Signed)
Pt did not get her refill for generic for Zestril 5 mg tab 1/2 daily,  via her mail order, wants to know if we can call her in about 4-5 pills to get thru till they get here? [facility]

## 2013-08-06 NOTE — Telephone Encounter (Signed)
RX sent into [facility] in [place]. Patient was notified.

## 2013-08-21 ENCOUNTER — Encounter (HOSPITAL_COMMUNITY)
Admission: RE | Admit: 2013-08-21 | Discharge: 2013-08-21 | Disposition: A | Discharge status: Home / Self Care | Payer: BC Managed Care – PPO | Source: Ambulatory Visit | Attending: Family Medicine | Admitting: Family Medicine

## 2013-08-21 DIAGNOSIS — M810 Age-related osteoporosis without current pathological fracture: Secondary | ICD-10-CM | POA: Insufficient documentation

## 2013-08-21 LAB — RENAL FUNCTION PANEL
Albumin: 3.7 g/dL (ref 3.5–5.2)
BUN: 13 mg/dL (ref 6–23)
Calcium: 10.5 mg/dL (ref 8.4–10.5)
Creatinine, Ser: 0.82 mg/dL (ref 0.50–1.10)
Phosphorus: 4.4 mg/dL (ref 2.3–4.6)
Potassium: 4.3 mEq/L (ref 3.5–5.1)

## 2013-08-21 MED ORDER — SODIUM CHLORIDE 0.9 % IJ SOLN
10.0000 mL | Freq: Once | INTRAMUSCULAR | Status: AC
  Administered 2013-08-21: 10 mL via INTRAVENOUS

## 2013-08-21 MED ORDER — IBANDRONATE SODIUM 3 MG/3ML IV SOLN
3.0000 mg | Freq: Once | INTRAVENOUS | Status: AC
  Administered 2013-08-21: 3 mg via INTRAVENOUS

## 2013-08-21 MED ORDER — IBANDRONATE SODIUM 3 MG/3ML IV SOLN
INTRAVENOUS | Status: AC
  Filled 2013-08-21: qty 3

## 2013-08-22 NOTE — Progress Notes (Signed)
Results for MIKAYA, BUNNER (MRN [id number]) as of 08/22/2013 08:30  Ref. Range 08/21/2013 14:45  Sodium Latest Range: 135-145 mEq/L 137  Potassium Latest Range: 3.5-5.1 mEq/L 4.3  Chloride Latest Range: 96-112 mEq/L 101  CO2 Latest Range: 19-32 mEq/L 28  BUN Latest Range: 6-23 mg/dL 13  Creatinine Latest Range: 0.50-1.10 mg/dL 6.96  Calcium Latest Range: 8.4-10.5 mg/dL 29.5  GFR calc non Af Amer Latest Range: >90 mL/min 72 (L)  GFR calc Af Amer Latest Range: >90 mL/min 84 (L)  Glucose Latest Range: 70-99 mg/dL 95  Phosphorus Latest Range: 2.3-4.6 mg/dL 4.4  Albumin Latest Range: 3.5-5.2 g/dL 3.7

## 2013-10-22 ENCOUNTER — Other Ambulatory Visit: Payer: Self-pay | Admitting: Family Medicine

## 2013-11-20 ENCOUNTER — Encounter (HOSPITAL_COMMUNITY)
Admission: RE | Admit: 2013-11-20 | Discharge: 2013-11-20 | Disposition: A | Discharge status: Home / Self Care | Payer: BC Managed Care – PPO | Source: Ambulatory Visit | Attending: Family Medicine | Admitting: Family Medicine

## 2013-11-20 DIAGNOSIS — M810 Age-related osteoporosis without current pathological fracture: Secondary | ICD-10-CM | POA: Insufficient documentation

## 2013-11-20 LAB — RENAL FUNCTION PANEL
Albumin: 4 g/dL (ref 3.5–5.2)
CO2: 26 mEq/L (ref 19–32)
Calcium: 10.2 mg/dL (ref 8.4–10.5)
Chloride: 103 mEq/L (ref 96–112)
GFR calc Af Amer: >90 mL/min (ref >90)
GFR calc non Af Amer: 84 mL/min — ABNORMAL LOW (ref >90)
Potassium: 4 mEq/L (ref 3.5–5.1)
Sodium: 140 mEq/L (ref 135–145)

## 2013-11-20 MED ORDER — IBANDRONATE SODIUM 3 MG/3ML IV SOLN
INTRAVENOUS | Status: AC
  Filled 2013-11-20: qty 3

## 2013-11-20 MED ORDER — SODIUM CHLORIDE 0.9 % IJ SOLN
10.0000 mL | INTRAMUSCULAR | Status: AC | PRN
  Administered 2013-11-20: 10 mL

## 2013-11-20 MED ORDER — IBANDRONATE SODIUM 3 MG/3ML IV SOLN
3.0000 mg | Freq: Once | INTRAVENOUS | Status: AC
  Administered 2013-11-20: 3 mg via INTRAVENOUS

## 2013-11-21 NOTE — Progress Notes (Signed)
Results for Shelly Stein, Shelly Stein (MRN [id number]) as of 11/21/2013 08:53  Ref. Range 11/20/2013 15:00  Sodium Latest Range: 135-145 mEq/L 140  Potassium Latest Range: 3.5-5.1 mEq/L 4.0  Chloride Latest Range: 96-112 mEq/L 103  CO2 Latest Range: 19-32 mEq/L 26  BUN Latest Range: 6-23 mg/dL 16  Creatinine Latest Range: 0.50-1.10 mg/dL 9.56  Calcium Latest Range: 8.4-10.5 mg/dL 21.3  GFR calc non Af Amer Latest Range: >90 mL/min 84 (L)  GFR calc Af Amer Latest Range: >90 mL/min >90  Glucose Latest Range: 70-99 mg/dL 97  Phosphorus Latest Range: 2.3-4.6 mg/dL 3.4  Albumin Latest Range: 3.5-5.2 g/dL 4.0  Boniva  IV 3mg  given today

## 2013-12-12 ENCOUNTER — Ambulatory Visit (HOSPITAL_COMMUNITY)
Admission: RE | Admit: 2013-12-12 | Discharge: 2013-12-12 | Disposition: A | Discharge status: Home / Self Care | Payer: BC Managed Care – PPO | Source: Ambulatory Visit | Attending: Family Medicine | Admitting: Family Medicine

## 2013-12-12 ENCOUNTER — Encounter: Payer: Self-pay | Admitting: Family Medicine

## 2013-12-12 ENCOUNTER — Other Ambulatory Visit: Payer: Self-pay | Admitting: Family Medicine

## 2013-12-12 ENCOUNTER — Ambulatory Visit (INDEPENDENT_AMBULATORY_CARE_PROVIDER_SITE_OTHER): Payer: BC Managed Care – PPO | Admitting: Family Medicine

## 2013-12-12 VITALS — BP 132/82 | Ht 64.0 in | Wt 144.8 lb

## 2013-12-12 DIAGNOSIS — M5137 Other intervertebral disc degeneration, lumbosacral region: Secondary | ICD-10-CM | POA: Insufficient documentation

## 2013-12-12 DIAGNOSIS — M5430 Sciatica, unspecified side: Secondary | ICD-10-CM

## 2013-12-12 DIAGNOSIS — M47817 Spondylosis without myelopathy or radiculopathy, lumbosacral region: Secondary | ICD-10-CM | POA: Insufficient documentation

## 2013-12-12 DIAGNOSIS — M79609 Pain in unspecified limb: Secondary | ICD-10-CM | POA: Insufficient documentation

## 2013-12-12 DIAGNOSIS — M5432 Sciatica, left side: Secondary | ICD-10-CM

## 2013-12-12 DIAGNOSIS — M51379 Other intervertebral disc degeneration, lumbosacral region without mention of lumbar back pain or lower extremity pain: Secondary | ICD-10-CM | POA: Insufficient documentation

## 2013-12-12 DIAGNOSIS — R262 Difficulty in walking, not elsewhere classified: Secondary | ICD-10-CM | POA: Insufficient documentation

## 2013-12-12 MED ORDER — PREDNISONE 20 MG PO TABS: ORAL_TABLET | ORAL | Status: AC

## 2013-12-12 MED ORDER — HYDROCODONE-ACETAMINOPHEN 7.5-325 MG PO TABS: 1.0000 | ORAL_TABLET | ORAL | Status: DC | PRN

## 2013-12-12 NOTE — Progress Notes (Signed)
   Subjective:    Patient ID: Shelly Stein, female    DOB: 15-Apr-1946, 68 y.o.   MRN: [id number]  HPI Patient arrives with left leg and hip pain for 3 weeks. Recently broke her left ankle in a fall in [place] it is completely healed. 3 weeks ago up the back of the leg Worse with sitting, also wih pressure and tingling Difficult to walk after laying or sitting ewith feet proped up due to the pain Tried NSAIDS/no help Some right leg pain PMH benign. Review of Systems  Constitutional: Negative for activity change, appetite change and fatigue.  Gastrointestinal: Negative for abdominal pain.  Neurological: Negative for headaches.  Psychiatric/Behavioral: Negative for behavioral problems.       Objective:   Physical Exam  Vitals reviewed. Constitutional: She appears well-nourished. No distress.  HENT:  Head: Normocephalic.  Cardiovascular: Normal rate, regular rhythm and normal heart sounds.   No murmur heard. Pulmonary/Chest: Effort normal and breath sounds normal.  Musculoskeletal: She exhibits no edema.  Left leg positive straight leg raise  Lymphadenopathy:    She has no cervical adenopathy.  Neurological: She is alert.  Psychiatric: Her behavior is normal.          Assessment & Plan:  Back pain with sciatica -- prednisone taper, prescription pain medication cautioned drowsiness, if not improving over the next few weeks may need MRI. Warning signs discussed.

## 2013-12-31 ENCOUNTER — Other Ambulatory Visit: Payer: Self-pay | Admitting: Family Medicine

## 2014-01-02 ENCOUNTER — Encounter: Payer: Self-pay | Admitting: Family Medicine

## 2014-01-02 ENCOUNTER — Ambulatory Visit (INDEPENDENT_AMBULATORY_CARE_PROVIDER_SITE_OTHER): Payer: BC Managed Care – PPO | Admitting: Family Medicine

## 2014-01-02 VITALS — BP 108/80 | Ht 64.0 in | Wt 144.4 lb

## 2014-01-02 DIAGNOSIS — R29898 Other symptoms and signs involving the musculoskeletal system: Secondary | ICD-10-CM

## 2014-01-02 DIAGNOSIS — M5430 Sciatica, unspecified side: Secondary | ICD-10-CM

## 2014-01-02 DIAGNOSIS — M5432 Sciatica, left side: Secondary | ICD-10-CM

## 2014-01-02 MED ORDER — OXYCODONE-ACETAMINOPHEN 10-325 MG PO TABS: 1.0000 | ORAL_TABLET | ORAL | Status: DC | PRN

## 2014-01-02 MED ORDER — GABAPENTIN 100 MG PO CAPS: 100.0000 mg | ORAL_CAPSULE | Freq: Three times a day (TID) | ORAL | Status: DC

## 2014-01-02 NOTE — Progress Notes (Signed)
   Subjective:    Patient ID: Shelly Stein, female    DOB: Mar 24, 1946, 68 y.o.   MRN: [id number]  Back Pain This is a recurrent problem. The current episode started more than 1 month ago. The problem occurs constantly. The problem has been gradually worsening since onset. The pain is present in the lumbar spine. The quality of the pain is described as burning. The pain radiates to the left foot. The pain is at a severity of 9/10. The pain is severe. The pain is the same all the time. The symptoms are aggravated by coughing, position, standing and twisting. Associated symptoms include leg pain, numbness, paresis, paresthesias and weakness. Pertinent negatives include no bladder incontinence, bowel incontinence or chest pain. She has tried bed rest, NSAIDs and home exercises for the symptoms. The treatment provided no relief.   Patient is here today for a follow up visit on her sciatica of the left hip, leg and back. States that the pain has not improved at all with the recent prednisone taper and pain medication.    Review of Systems  Cardiovascular: Negative for chest pain.  Gastrointestinal: Negative for bowel incontinence.  Genitourinary: Negative for bladder incontinence.  Musculoskeletal: Positive for back pain.  Neurological: Positive for weakness, numbness and paresthesias.       Objective:   Physical Exam  Lungs are clear hearts regular low back pain on the left side positive straight leg raise. Also has weakness with plantar flexion on the left foot. Patient also has difficulty walking on her toes cannot walk on her heels needs help getting up and onto exam table and off      Assessment & Plan:  Sciatica symptoms with severe left leg pain along with significant weakness referral for MRI indicated. Await the results of this. May need neurosurgical consultation. Has hard he been on prednisone no additional prednisone indicated Percocet prescription given 10 mg 1 every 4 hours when  necessary severe pain cautioned drowsiness.

## 2014-01-04 ENCOUNTER — Other Ambulatory Visit: Payer: Self-pay | Admitting: Family Medicine

## 2014-01-05 ENCOUNTER — Telehealth: Payer: Self-pay | Admitting: Family Medicine

## 2014-01-05 MED ORDER — LORAZEPAM 0.5 MG PO TABS: ORAL_TABLET | ORAL | Status: DC

## 2014-01-05 NOTE — Telephone Encounter (Signed)
Please change previous message. Ativan 0.5 mg one by mouth one hour before procedure, family is to drive her, #4, no refills

## 2014-01-05 NOTE — Telephone Encounter (Signed)
Patient was notified script will be faxed to pharmacy today. Patient verbalized understanding.

## 2014-01-05 NOTE — Telephone Encounter (Signed)
Patient was scheduled for an MRI, but patient called [facility] and realized it is a closed tube. She said she needs something for her extreme claustrophobia if possible called in.  [place]

## 2014-01-05 NOTE — Telephone Encounter (Signed)
Ativan 1 mg , 1 by mouth 1 hour before procedure family member needs to drive her, #4, no refills

## 2014-01-07 ENCOUNTER — Ambulatory Visit (HOSPITAL_COMMUNITY)
Admission: RE | Admit: 2014-01-07 | Discharge: 2014-01-07 | Disposition: A | Discharge status: Home / Self Care | Payer: BC Managed Care – PPO | Source: Ambulatory Visit | Attending: Family Medicine | Admitting: Family Medicine

## 2014-01-07 DIAGNOSIS — M545 Low back pain, unspecified: Secondary | ICD-10-CM | POA: Insufficient documentation

## 2014-01-07 DIAGNOSIS — M5126 Other intervertebral disc displacement, lumbar region: Secondary | ICD-10-CM | POA: Insufficient documentation

## 2014-01-07 DIAGNOSIS — M51379 Other intervertebral disc degeneration, lumbosacral region without mention of lumbar back pain or lower extremity pain: Secondary | ICD-10-CM | POA: Insufficient documentation

## 2014-01-07 DIAGNOSIS — K838 Other specified diseases of biliary tract: Secondary | ICD-10-CM | POA: Insufficient documentation

## 2014-01-07 DIAGNOSIS — R209 Unspecified disturbances of skin sensation: Secondary | ICD-10-CM | POA: Insufficient documentation

## 2014-01-07 DIAGNOSIS — M5137 Other intervertebral disc degeneration, lumbosacral region: Secondary | ICD-10-CM | POA: Insufficient documentation

## 2014-01-07 DIAGNOSIS — G589 Mononeuropathy, unspecified: Secondary | ICD-10-CM | POA: Insufficient documentation

## 2014-01-08 ENCOUNTER — Other Ambulatory Visit (HOSPITAL_COMMUNITY): Payer: BC Managed Care – PPO

## 2014-01-09 ENCOUNTER — Other Ambulatory Visit: Payer: Self-pay | Admitting: Family Medicine

## 2014-01-09 DIAGNOSIS — M5432 Sciatica, left side: Secondary | ICD-10-CM

## 2014-01-16 ENCOUNTER — Other Ambulatory Visit: Payer: Self-pay | Admitting: Family Medicine

## 2014-01-16 ENCOUNTER — Telehealth: Payer: Self-pay | Admitting: Family Medicine

## 2014-01-16 MED ORDER — CIPROFLOXACIN HCL 500 MG PO TABS
500.0000 mg | ORAL_TABLET | Freq: Two times a day (BID) | ORAL | Status: DC
Start: 2014-01-16 — End: 2014-10-23

## 2014-01-16 MED ORDER — OXYCODONE-ACETAMINOPHEN 10-325 MG PO TABS: 1.0000 | ORAL_TABLET | ORAL | Status: DC | PRN

## 2014-01-16 NOTE — Telephone Encounter (Signed)
Patient needs Rx for percocet   Also, patient thinks she has a UTI, but she says she does not feel like sitting in office because of the discomfort so she wants to know if we can call something in or recommend OTC product.   [place]

## 2014-01-16 NOTE — Telephone Encounter (Signed)
Patient notified and verbalized understanding. 

## 2014-01-16 NOTE — Telephone Encounter (Signed)
May have Percocet refill, also for this situation Cipro 500 one bid 3 days- should clear it up, f/u if ongoing

## 2014-01-18 NOTE — Telephone Encounter (Signed)
I believe this was handled on Friday

## 2014-02-06 ENCOUNTER — Telehealth: Payer: Self-pay | Admitting: *Deleted

## 2014-02-06 ENCOUNTER — Telehealth: Payer: Self-pay | Admitting: Family Medicine

## 2014-02-06 ENCOUNTER — Other Ambulatory Visit: Payer: Self-pay | Admitting: Family Medicine

## 2014-02-06 MED ORDER — OXYCODONE-ACETAMINOPHEN 10-325 MG PO TABS: 1.0000 | ORAL_TABLET | ORAL | Status: DC | PRN

## 2014-02-06 NOTE — Telephone Encounter (Signed)
Patient needs refill  on oxycodone 10/325mg 

## 2014-02-06 NOTE — Addendum Note (Signed)
Addended byCharolotte Capuchin D on: 02/06/2014 02:15 PM   Modules accepted: Orders

## 2014-02-06 NOTE — Telephone Encounter (Signed)
Last Rx 01/16/14 for #40 -use sparingly and not with hydrocodone

## 2014-02-06 NOTE — Telephone Encounter (Signed)
She may have a refill of this medicine same amount. Same instructions. Next followup visit in April, sooner if problems

## 2014-02-06 NOTE — Telephone Encounter (Signed)
Pt notified to pick up Rx

## 2014-02-19 ENCOUNTER — Encounter (HOSPITAL_COMMUNITY)
Admission: RE | Admit: 2014-02-19 | Discharge: 2014-02-19 | Disposition: A | Discharge status: Home / Self Care | Payer: BC Managed Care – PPO | Source: Ambulatory Visit | Attending: Family Medicine | Admitting: Family Medicine

## 2014-02-19 DIAGNOSIS — M810 Age-related osteoporosis without current pathological fracture: Secondary | ICD-10-CM | POA: Insufficient documentation

## 2014-02-19 LAB — RENAL FUNCTION PANEL
Albumin: 4.1 g/dL (ref 3.5–5.2)
BUN: 15 mg/dL (ref 6–23)
CO2: 26 meq/L (ref 19–32)
Calcium: 10.1 mg/dL (ref 8.4–10.5)
Chloride: 104 mEq/L (ref 96–112)
Creatinine, Ser: 0.77 mg/dL (ref 0.50–1.10)
GFR, EST NON AFRICAN AMERICAN: 85 mL/min — AB (ref >90)
GLUCOSE: 113 mg/dL — AB (ref 70–99)
POTASSIUM: 4.2 meq/L (ref 3.7–5.3)
Phosphorus: 3.4 mg/dL (ref 2.3–4.6)
SODIUM: 141 meq/L (ref 137–147)

## 2014-02-19 MED ORDER — IBANDRONATE SODIUM 3 MG/3ML IV SOLN
3.0000 mg | Freq: Once | INTRAVENOUS | Status: AC
  Administered 2014-02-19: 3 mg via INTRAVENOUS

## 2014-02-19 MED ORDER — IBANDRONATE SODIUM 3 MG/3ML IV SOLN
INTRAVENOUS | Status: AC
  Filled 2014-02-19: qty 3

## 2014-02-19 MED ORDER — SODIUM CHLORIDE 0.9 % IJ SOLN
10.0000 mL | Freq: Once | INTRAMUSCULAR | Status: AC
  Administered 2014-02-19: 10 mL via INTRAVENOUS

## 2014-02-19 NOTE — Progress Notes (Signed)
Results for Shelly Stein, Shelly Stein (MRN [id number]) as of 02/19/2014 16:10  Ref. Range 02/19/2014 14:45  Sodium Latest Range: 137-147 mEq/L 141  Potassium Latest Range: 3.7-5.3 mEq/L 4.2  Chloride Latest Range: 96-112 mEq/L 104  CO2 Latest Range: 19-32 mEq/L 26  BUN Latest Range: 6-23 mg/dL 15  Creatinine Latest Range: 0.50-1.10 mg/dL 0.77  Calcium Latest Range: 8.4-10.5 mg/dL 10.1  GFR calc non Af Amer Latest Range: >90 mL/min 85 (L)  GFR calc Af Amer Latest Range: >90 mL/min >90  Glucose Latest Range: 70-99 mg/dL 113 (H)  Phosphorus Latest Range: 2.3-4.6 mg/dL 3.4  Albumin Latest Range: 3.5-5.2 g/dL 4.1

## 2014-04-13 ENCOUNTER — Telehealth: Payer: Self-pay | Admitting: Family Medicine

## 2014-04-13 NOTE — Telephone Encounter (Signed)
If it is a brief form I don't find filling it out without our charge. It is necessary though to know when this trip was. When her flareup was. And what she did about her flareup. If the form is moderate in length $10 fee

## 2014-04-13 NOTE — Telephone Encounter (Signed)
Patient brought in form for reimburstment for a trip she couldn't take due to the fact that her sciatica flareup. Can you fill out form and is their a charge ,if so how much?

## 2014-04-14 ENCOUNTER — Telehealth: Payer: Self-pay | Admitting: Family Medicine

## 2014-04-14 NOTE — Telephone Encounter (Signed)
The dates of patient trip was April 21-27 she had to cancel due to back pain. She was seen here 1/2 and 1/23 . She was referral out for her injection in her back. She has to take papers their also.Due to results of her MRI she decided to cancel her trip.she didn't think she could take the flying  and sitting so long.

## 2014-04-15 NOTE — Telephone Encounter (Signed)
No charge for the form. It was filled out. Given to [facility].

## 2014-05-03 DIAGNOSIS — N390 Urinary tract infection, site not specified: Secondary | ICD-10-CM | POA: Diagnosis not present

## 2014-05-03 DIAGNOSIS — R300 Dysuria: Secondary | ICD-10-CM | POA: Diagnosis not present

## 2014-05-19 ENCOUNTER — Encounter: Payer: Self-pay | Admitting: *Deleted

## 2014-05-28 ENCOUNTER — Inpatient Hospital Stay (HOSPITAL_COMMUNITY): Admission: RE | Admit: 2014-05-28 | Payer: BC Managed Care – PPO | Source: Ambulatory Visit

## 2014-05-28 ENCOUNTER — Ambulatory Visit (HOSPITAL_COMMUNITY): Payer: BC Managed Care – PPO

## 2014-06-02 ENCOUNTER — Other Ambulatory Visit: Payer: Self-pay | Admitting: Family Medicine

## 2014-06-04 ENCOUNTER — Encounter (HOSPITAL_COMMUNITY)
Admission: RE | Admit: 2014-06-04 | Discharge: 2014-06-04 | Disposition: A | Discharge status: Home / Self Care | Payer: BC Managed Care – PPO | Source: Ambulatory Visit | Attending: Family Medicine | Admitting: Family Medicine

## 2014-06-04 DIAGNOSIS — M810 Age-related osteoporosis without current pathological fracture: Secondary | ICD-10-CM | POA: Insufficient documentation

## 2014-06-04 LAB — RENAL FUNCTION PANEL
Albumin: 3.5 g/dL (ref 3.5–5.2)
BUN: 17 mg/dL (ref 6–23)
CALCIUM: 9.3 mg/dL (ref 8.4–10.5)
CO2: 26 meq/L (ref 19–32)
Chloride: 106 mEq/L (ref 96–112)
Creatinine, Ser: 0.73 mg/dL (ref 0.50–1.10)
GFR, EST NON AFRICAN AMERICAN: 86 mL/min — AB (ref >90)
Glucose, Bld: 135 mg/dL — ABNORMAL HIGH (ref 70–99)
PHOSPHORUS: 3.6 mg/dL (ref 2.3–4.6)
Potassium: 3.9 mEq/L (ref 3.7–5.3)
SODIUM: 144 meq/L (ref 137–147)

## 2014-06-04 MED ORDER — IBANDRONATE SODIUM 3 MG/3ML IV SOLN
3.0000 mg | Freq: Once | INTRAVENOUS | Status: AC
  Administered 2014-06-04: 3 mg via INTRAVENOUS

## 2014-06-04 MED ORDER — IBANDRONATE SODIUM 3 MG/3ML IV SOLN
INTRAVENOUS | Status: AC
  Filled 2014-06-04: qty 3

## 2014-06-04 MED ORDER — SODIUM CHLORIDE 0.9 % IJ SOLN
10.0000 mL | INTRAMUSCULAR | Status: AC
  Administered 2014-06-04: 10 mL via INTRAVENOUS
  Filled 2014-06-04: qty 10

## 2014-06-05 NOTE — Progress Notes (Signed)
Results for LANISSA, CASHEN (MRN [id number]) as of 06/05/2014 07:05  Ref. Range 06/04/2014 14:30  Sodium Latest Range: 137-147 mEq/L 144  Potassium Latest Range: 3.7-5.3 mEq/L 3.9  Chloride Latest Range: 96-112 mEq/L 106  CO2 Latest Range: 19-32 mEq/L 26  BUN Latest Range: 6-23 mg/dL 17  Creatinine Latest Range: 0.50-1.10 mg/dL 0.73  Calcium Latest Range: 8.4-10.5 mg/dL 9.3  GFR calc non Af Amer Latest Range: >90 mL/min 86 (L)  GFR calc Af Amer Latest Range: >90 mL/min >90  Glucose Latest Range: 70-99 mg/dL 135 (H)  Phosphorus Latest Range: 2.3-4.6 mg/dL 3.6  Albumin Latest Range: 3.5-5.2 g/dL 3.5

## 2014-08-14 ENCOUNTER — Other Ambulatory Visit: Payer: Self-pay | Admitting: Family Medicine

## 2014-08-20 ENCOUNTER — Other Ambulatory Visit: Payer: Self-pay | Admitting: *Deleted

## 2014-08-20 MED ORDER — AMITRIPTYLINE HCL 10 MG PO TABS: ORAL_TABLET | ORAL | Status: DC

## 2014-08-21 ENCOUNTER — Telehealth: Payer: Self-pay | Admitting: Family Medicine

## 2014-08-21 MED ORDER — AMITRIPTYLINE HCL 10 MG PO TABS: ORAL_TABLET | ORAL | Status: DC

## 2014-08-21 NOTE — Telephone Encounter (Signed)
Patient needs Rx for amitriptyline to her mail order pharmacy. She said that is used to be called [facility], but she does not know of new name.   Phone # [phone] Fax # [phone]

## 2014-08-21 NOTE — Telephone Encounter (Signed)
May have 90 day prescription with one refill needs followup office visit later this year.

## 2014-08-21 NOTE — Telephone Encounter (Signed)
Rx sent electronically to pharmacy. Patient notified. 

## 2014-09-04 ENCOUNTER — Ambulatory Visit (HOSPITAL_COMMUNITY): Payer: BC Managed Care – PPO

## 2014-09-04 ENCOUNTER — Inpatient Hospital Stay (HOSPITAL_COMMUNITY): Admission: RE | Admit: 2014-09-04 | Payer: BC Managed Care – PPO | Source: Ambulatory Visit

## 2014-09-11 ENCOUNTER — Encounter (HOSPITAL_COMMUNITY)
Admission: RE | Admit: 2014-09-11 | Discharge: 2014-09-11 | Disposition: A | Discharge status: Home / Self Care | Payer: BC Managed Care – PPO | Source: Ambulatory Visit | Attending: Family Medicine | Admitting: Family Medicine

## 2014-09-11 DIAGNOSIS — M810 Age-related osteoporosis without current pathological fracture: Secondary | ICD-10-CM | POA: Diagnosis not present

## 2014-09-11 MED ORDER — SODIUM CHLORIDE 0.9 % IJ SOLN
10.0000 mL | Freq: Once | INTRAMUSCULAR | Status: AC
  Administered 2014-09-11: 10 mL via INTRAVENOUS

## 2014-09-11 MED ORDER — IBANDRONATE SODIUM 3 MG/3ML IV SOLN
INTRAVENOUS | Status: AC
  Filled 2014-09-11: qty 3

## 2014-09-11 MED ORDER — SODIUM CHLORIDE 0.9 % IV SOLN: INTRAVENOUS | Status: DC

## 2014-09-11 MED ORDER — IBANDRONATE SODIUM 3 MG/3ML IV SOLN
3.0000 mg | Freq: Once | INTRAVENOUS | Status: AC
  Administered 2014-09-11: 3 mg via INTRAVENOUS

## 2014-10-01 ENCOUNTER — Ambulatory Visit: Payer: BC Managed Care – PPO | Admitting: Family Medicine

## 2014-10-23 ENCOUNTER — Encounter: Payer: Self-pay | Admitting: Family Medicine

## 2014-10-23 ENCOUNTER — Ambulatory Visit (INDEPENDENT_AMBULATORY_CARE_PROVIDER_SITE_OTHER): Payer: BC Managed Care – PPO | Admitting: Family Medicine

## 2014-10-23 VITALS — BP 122/80 | Ht 64.0 in | Wt 151.4 lb

## 2014-10-23 DIAGNOSIS — M8580 Other specified disorders of bone density and structure, unspecified site: Secondary | ICD-10-CM

## 2014-10-23 DIAGNOSIS — R739 Hyperglycemia, unspecified: Secondary | ICD-10-CM

## 2014-10-23 DIAGNOSIS — Z23 Encounter for immunization: Secondary | ICD-10-CM

## 2014-10-23 DIAGNOSIS — E785 Hyperlipidemia, unspecified: Secondary | ICD-10-CM

## 2014-10-23 MED ORDER — LISINOPRIL 5 MG PO TABS: ORAL_TABLET | ORAL | Status: DC

## 2014-10-23 MED ORDER — AMITRIPTYLINE HCL 10 MG PO TABS: ORAL_TABLET | ORAL | Status: DC

## 2014-10-23 NOTE — Progress Notes (Signed)
   Subjective:    Patient ID: Shelly Stein, female    DOB: 08-Aug-1946, 68 y.o.   MRN: [id number]  Hypertension This is a chronic problem. The current episode started more than 1 year ago. The problem has been gradually improving since onset. The problem is controlled. There are no associated agents to hypertension. There are no known risk factors for coronary artery disease. Treatments tried: lisinopril. The current treatment provides significant improvement. There are no compliance problems.   Patient needs wellness paperwork filled out for insurance. Patient needs a copy of her bloodwork also.     Review of Systems She denies chest tightness pressure pain shortness breath nausea vomiting diarrhea.    Objective:   Physical Exam Lungs clear hearts regular pulse normal extremities no edema skin warm dry       Assessment & Plan:  Lipid profile recommended due to history hyperlipidemia weight results A1c glucose recommended due to history of hyperglycemia recommend healthy diet regular physical activity Osteopenia osteoporosis continue current treatments she had bone scan at hospital in the [facility] await the results of this she states it showed continue osteoporosis  Chronic back pain she follows up with Dr. Carloyn Manner as well as Dr. Francesco Runner

## 2014-11-01 LAB — LIPID PANEL
Cholesterol: 217 mg/dL — ABNORMAL HIGH (ref 0–200)
HDL: 53 mg/dL (ref >39)
LDL Cholesterol: 149 mg/dL — ABNORMAL HIGH (ref 0–99)
Total CHOL/HDL Ratio: 4.1 Ratio
Triglycerides: 74 mg/dL (ref <150)
VLDL: 15 mg/dL (ref 0–40)

## 2014-11-01 LAB — HEMOGLOBIN A1C
HEMOGLOBIN A1C: 5.8 % — AB (ref <5.7)
Mean Plasma Glucose: 120 mg/dL — ABNORMAL HIGH (ref <117)

## 2014-11-02 ENCOUNTER — Telehealth: Payer: Self-pay | Admitting: *Deleted

## 2014-11-02 LAB — VITAMIN D 25 HYDROXY (VIT D DEFICIENCY, FRACTURES): VIT D 25 HYDROXY: 47 ng/mL (ref 30–100)

## 2014-11-02 NOTE — Telephone Encounter (Signed)
Form up front for pickup. [facility]

## 2014-11-02 NOTE — Telephone Encounter (Signed)
[facility] to let pt know form is ready for pickup.

## 2014-11-03 NOTE — Telephone Encounter (Signed)
Pt.notified

## 2014-12-17 ENCOUNTER — Inpatient Hospital Stay (HOSPITAL_COMMUNITY): Admission: RE | Admit: 2014-12-17 | Payer: BC Managed Care – PPO | Source: Ambulatory Visit

## 2014-12-17 ENCOUNTER — Other Ambulatory Visit (HOSPITAL_COMMUNITY): Payer: BC Managed Care – PPO

## 2014-12-24 ENCOUNTER — Encounter (HOSPITAL_COMMUNITY)
Admission: RE | Admit: 2014-12-24 | Discharge: 2014-12-24 | Disposition: A | Discharge status: Home / Self Care | Payer: BLUE CROSS/BLUE SHIELD | Source: Ambulatory Visit | Attending: Family Medicine | Admitting: Family Medicine

## 2014-12-24 ENCOUNTER — Encounter (HOSPITAL_COMMUNITY): Payer: Self-pay

## 2014-12-24 DIAGNOSIS — M810 Age-related osteoporosis without current pathological fracture: Secondary | ICD-10-CM | POA: Diagnosis present

## 2014-12-24 LAB — RENAL FUNCTION PANEL
ALBUMIN: 4 g/dL (ref 3.5–5.2)
Anion gap: 4 — ABNORMAL LOW (ref 5–15)
BUN: 18 mg/dL (ref 6–23)
CHLORIDE: 106 meq/L (ref 96–112)
CO2: 28 mmol/L (ref 19–32)
CREATININE: 0.67 mg/dL (ref 0.50–1.10)
Calcium: 10 mg/dL (ref 8.4–10.5)
GFR calc Af Amer: >90 mL/min (ref >90)
GFR calc non Af Amer: 88 mL/min — ABNORMAL LOW (ref >90)
Glucose, Bld: 94 mg/dL (ref 70–99)
PHOSPHORUS: 3.6 mg/dL (ref 2.3–4.6)
Potassium: 4 mmol/L (ref 3.5–5.1)
SODIUM: 138 mmol/L (ref 135–145)

## 2014-12-24 MED ORDER — IBANDRONATE SODIUM 3 MG/3ML IV SOLN
3.0000 mg | Freq: Once | INTRAVENOUS | Status: AC
  Administered 2014-12-24: 3 mg via INTRAVENOUS

## 2014-12-24 MED ORDER — IBANDRONATE SODIUM 3 MG/3ML IV SOLN
INTRAVENOUS | Status: AC
  Filled 2014-12-24: qty 3

## 2014-12-24 MED ORDER — SODIUM CHLORIDE 0.9 % IJ SOLN
10.0000 mL | INTRAMUSCULAR | Status: AC | PRN
  Administered 2014-12-24: 10 mL

## 2014-12-24 NOTE — Progress Notes (Signed)
Results for MALAAK, STACH (MRN [id number]) as of 12/24/2014 15:49  Renal panel prior to boniva IV, tolerated well.   Ref. Range 12/24/2014 14:30  Sodium Latest Range: 135-145 mmol/L 138  Potassium Latest Range: 3.5-5.1 mmol/L 4.0  Chloride Latest Range: 96-112 mEq/L 106  CO2 Latest Range: 19-32 mmol/L 28  BUN Latest Range: 6-23 mg/dL 18  Creatinine Latest Range: 0.50-1.10 mg/dL 0.67  Calcium Latest Range: 8.4-10.5 mg/dL 10.0  GFR calc non Af Amer Latest Range: >90 mL/min 88 (L)  GFR calc Af Amer Latest Range: >90 mL/min >90  Glucose Latest Range: 70-99 mg/dL 94  Anion gap Latest Range: 5-15  4 (L)  Phosphorus Latest Range: 2.3-4.6 mg/dL 3.6  Albumin Latest Range: 3.5-5.2 g/dL 4.0

## 2015-03-12 ENCOUNTER — Other Ambulatory Visit: Payer: Self-pay

## 2015-03-12 DIAGNOSIS — Z1231 Encounter for screening mammogram for malignant neoplasm of breast: Secondary | ICD-10-CM

## 2015-03-17 ENCOUNTER — Ambulatory Visit
Admission: RE | Admit: 2015-03-17 | Discharge: 2015-03-17 | Disposition: A | Discharge status: Home / Self Care | Payer: BLUE CROSS/BLUE SHIELD | Source: Ambulatory Visit

## 2015-03-17 DIAGNOSIS — Z1231 Encounter for screening mammogram for malignant neoplasm of breast: Secondary | ICD-10-CM

## 2015-03-25 ENCOUNTER — Encounter (HOSPITAL_COMMUNITY)
Admission: RE | Admit: 2015-03-25 | Discharge: 2015-03-25 | Disposition: A | Discharge status: Home / Self Care | Payer: BLUE CROSS/BLUE SHIELD | Source: Ambulatory Visit | Attending: Family Medicine | Admitting: Family Medicine

## 2015-03-25 DIAGNOSIS — M810 Age-related osteoporosis without current pathological fracture: Secondary | ICD-10-CM | POA: Diagnosis not present

## 2015-03-25 LAB — RENAL FUNCTION PANEL
Albumin: 4.1 g/dL (ref 3.5–5.2)
Anion gap: 7 (ref 5–15)
BUN: 20 mg/dL (ref 6–23)
CO2: 26 mmol/L (ref 19–32)
CREATININE: 0.71 mg/dL (ref 0.50–1.10)
Calcium: 9.7 mg/dL (ref 8.4–10.5)
Chloride: 105 mmol/L (ref 96–112)
GFR calc Af Amer: >90 mL/min (ref >90)
GFR calc non Af Amer: 87 mL/min — ABNORMAL LOW (ref >90)
GLUCOSE: 101 mg/dL — AB (ref 70–99)
PHOSPHORUS: 3.5 mg/dL (ref 2.3–4.6)
Potassium: 4.2 mmol/L (ref 3.5–5.1)
SODIUM: 138 mmol/L (ref 135–145)

## 2015-03-25 MED ORDER — IBANDRONATE SODIUM 3 MG/3ML IV SOLN
INTRAVENOUS | Status: AC
Start: 2015-03-25 — End: 2015-03-25
  Filled 2015-03-25: qty 3

## 2015-03-25 MED ORDER — SODIUM CHLORIDE 0.9 % IJ SOLN
10.0000 mL | Freq: Once | INTRAMUSCULAR | Status: AC
  Administered 2015-03-25: 10 mL via INTRAVENOUS

## 2015-03-25 MED ORDER — IBANDRONATE SODIUM 3 MG/3ML IV SOLN
3.0000 mg | Freq: Once | INTRAVENOUS | Status: AC
  Administered 2015-03-25: 3 mg via INTRAVENOUS

## 2015-04-16 ENCOUNTER — Telehealth: Payer: Self-pay | Admitting: Family Medicine

## 2015-04-16 NOTE — Telephone Encounter (Signed)
Please try to determine how long the patient has been on Boniva or oral medicines for osteoporosis

## 2015-04-16 NOTE — Telephone Encounter (Signed)
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 She knows she has been on Boniva for atleast 4-5 years now.

## 2015-04-16 NOTE — Telephone Encounter (Signed)
Pt wants to speak to the nurse about her boniva injections. Pt read an  Article about them in a magazine causing fractures. Pt had mentioned it To her gynecologist while she was there and the gynecologist recommended She ask her pcp about a "boniva holiday", she would like a call back.

## 2015-04-19 NOTE — Telephone Encounter (Signed)
Patient want to know what you think would be best for her - she is fine continuing or holiday depending on what you rec. She said her next treatment is not due till July. She wants to do whatever you think is best for her.

## 2015-04-19 NOTE — Telephone Encounter (Signed)
We will go ahead and do a drug holiday from now through her next bone density test in 2017

## 2015-04-19 NOTE — Telephone Encounter (Signed)
This is a complex issue. There is no standard approach at the present moment based upon studies. This is only based upon opinions of experts to treat osteoporosis. The risk of atypical fractures of the femur is low but present. It is felt that medications that treat osteopenia and osteoporosis increase the risk of atypical fractures. It is also recommended that a patient ever has mid femur pain she should let her physician know right away. Currently the thought is if a person is been on medication for approximately 5-6 years is to consider a 6-12 month holiday from the [place]. It is felt that this might diminish the risk of atypical fractures. With her most recent bone density last June 2015 showing osteopenia I think it is reasonable for her to do a drug holiday until her next bone density in 2017 and at that point if the test shows worsening of the issue restart the Boniva. Please talk with the patient see what she would like to do.

## 2015-04-19 NOTE — Telephone Encounter (Signed)
Discussed with patient. Patient advised to go ahead and do a drug holiday from now through her next bone density test in 2017. Patient verbalized understanding.

## 2015-06-24 ENCOUNTER — Ambulatory Visit (HOSPITAL_COMMUNITY): Payer: BLUE CROSS/BLUE SHIELD

## 2015-06-24 ENCOUNTER — Other Ambulatory Visit (HOSPITAL_COMMUNITY): Payer: BLUE CROSS/BLUE SHIELD

## 2015-09-13 ENCOUNTER — Encounter: Payer: Self-pay | Admitting: Gastroenterology

## 2015-11-04 ENCOUNTER — Other Ambulatory Visit: Payer: Self-pay | Admitting: Family Medicine

## 2015-11-08 ENCOUNTER — Ambulatory Visit: Payer: BLUE CROSS/BLUE SHIELD | Admitting: Gastroenterology

## 2016-01-10 ENCOUNTER — Other Ambulatory Visit (HOSPITAL_COMMUNITY): Payer: Self-pay | Admitting: Anesthesiology

## 2016-01-10 DIAGNOSIS — M545 Low back pain: Secondary | ICD-10-CM

## 2016-01-14 ENCOUNTER — Ambulatory Visit (HOSPITAL_COMMUNITY)
Admission: RE | Admit: 2016-01-14 | Discharge: 2016-01-14 | Disposition: A | Discharge status: Home / Self Care | Payer: BLUE CROSS/BLUE SHIELD | Source: Ambulatory Visit | Attending: Anesthesiology | Admitting: Anesthesiology

## 2016-01-14 DIAGNOSIS — M5126 Other intervertebral disc displacement, lumbar region: Secondary | ICD-10-CM | POA: Diagnosis present

## 2016-01-14 DIAGNOSIS — M545 Low back pain: Secondary | ICD-10-CM

## 2016-01-14 DIAGNOSIS — M47816 Spondylosis without myelopathy or radiculopathy, lumbar region: Secondary | ICD-10-CM | POA: Insufficient documentation

## 2016-01-14 DIAGNOSIS — M4690 Unspecified inflammatory spondylopathy, site unspecified: Secondary | ICD-10-CM | POA: Diagnosis not present

## 2016-01-21 ENCOUNTER — Other Ambulatory Visit: Payer: Self-pay

## 2016-01-21 MED ORDER — LISINOPRIL 5 MG PO TABS: ORAL_TABLET | ORAL | Status: DC

## 2016-02-07 ENCOUNTER — Other Ambulatory Visit: Payer: Self-pay

## 2016-02-07 DIAGNOSIS — Z1231 Encounter for screening mammogram for malignant neoplasm of breast: Secondary | ICD-10-CM

## 2016-02-07 DIAGNOSIS — Z803 Family history of malignant neoplasm of breast: Secondary | ICD-10-CM

## 2016-02-24 ENCOUNTER — Encounter: Payer: Self-pay | Admitting: Family Medicine

## 2016-02-24 ENCOUNTER — Ambulatory Visit (INDEPENDENT_AMBULATORY_CARE_PROVIDER_SITE_OTHER): Payer: Medicare Other | Admitting: Family Medicine

## 2016-02-24 VITALS — BP 110/70 | Ht 64.0 in | Wt 132.1 lb

## 2016-02-24 DIAGNOSIS — E559 Vitamin D deficiency, unspecified: Secondary | ICD-10-CM | POA: Diagnosis not present

## 2016-02-24 DIAGNOSIS — E785 Hyperlipidemia, unspecified: Secondary | ICD-10-CM

## 2016-02-24 DIAGNOSIS — I10 Essential (primary) hypertension: Secondary | ICD-10-CM

## 2016-02-24 MED ORDER — LISINOPRIL 5 MG PO TABS: ORAL_TABLET | ORAL | Status: DC

## 2016-02-24 NOTE — Progress Notes (Signed)
   Subjective:    Patient ID: Shelly Stein, female    DOB: 1946-07-07, 70 y.o.   MRN: [id number]  HPI  Patient in today for a routine medication check.  Patient with history hyperlipidemia. The importance of healthy diet discussed HTN good control takes half tablet a Has vitamin D deficiency osteoporosis continue vitamin D supplementation will check lab work 81 mg as heart attack prevention tolerating well Up-to-date on colonoscopy States no other concerns this visit.   Review of Systems  Constitutional: Negative for activity change, appetite change and fatigue.  HENT: Negative for congestion.   Respiratory: Negative for cough.   Cardiovascular: Negative for chest pain.  Gastrointestinal: Negative for abdominal pain.  Endocrine: Negative for polydipsia and polyphagia.  Neurological: Negative for weakness.  Psychiatric/Behavioral: Negative for confusion.       Objective:   Physical Exam  Constitutional: She appears well-nourished. No distress.  Cardiovascular: Normal rate, regular rhythm and normal heart sounds.   No murmur heard. Pulmonary/Chest: Effort normal and breath sounds normal. No respiratory distress.  Musculoskeletal: She exhibits no edema.  Lymphadenopathy:    She has no cervical adenopathy.  Neurological: She is alert. She exhibits normal muscle tone.  Psychiatric: Her behavior is normal.  Vitals reviewed.         Assessment & Plan:  HTN good control follow-up in one year Osteoporosis taking her Boniva holiday bone density in the summer she will call us we will set it up  Lab work ordered

## 2016-02-25 ENCOUNTER — Telehealth: Payer: Self-pay | Admitting: Family Medicine

## 2016-02-25 MED ORDER — LISINOPRIL 5 MG PO TABS: ORAL_TABLET | ORAL | Status: DC

## 2016-02-25 NOTE — Telephone Encounter (Signed)
Rx sent electronically to pharmacy. Patient notified. 

## 2016-02-25 NOTE — Telephone Encounter (Signed)
Pt would like the lisinopril sent to [facility] in [place]

## 2016-02-29 DIAGNOSIS — M5126 Other intervertebral disc displacement, lumbar region: Secondary | ICD-10-CM | POA: Diagnosis not present

## 2016-02-29 DIAGNOSIS — M810 Age-related osteoporosis without current pathological fracture: Secondary | ICD-10-CM | POA: Diagnosis not present

## 2016-03-03 DIAGNOSIS — E559 Vitamin D deficiency, unspecified: Secondary | ICD-10-CM | POA: Diagnosis not present

## 2016-03-03 DIAGNOSIS — E785 Hyperlipidemia, unspecified: Secondary | ICD-10-CM | POA: Diagnosis not present

## 2016-03-03 DIAGNOSIS — I10 Essential (primary) hypertension: Secondary | ICD-10-CM | POA: Diagnosis not present

## 2016-03-04 ENCOUNTER — Encounter: Payer: Self-pay | Admitting: Family Medicine

## 2016-03-04 LAB — BASIC METABOLIC PANEL
BUN/Creatinine Ratio: 21 (ref 11–26)
BUN: 17 mg/dL (ref 8–27)
CALCIUM: 9.5 mg/dL (ref 8.7–10.3)
CHLORIDE: 104 mmol/L (ref 96–106)
CO2: 23 mmol/L (ref 18–29)
Creatinine, Ser: 0.8 mg/dL (ref 0.57–1.00)
GFR calc Af Amer: 87 mL/min/{1.73_m2} (ref >59)
GFR calc non Af Amer: 75 mL/min/{1.73_m2} (ref >59)
GLUCOSE: 100 mg/dL — AB (ref 65–99)
POTASSIUM: 4.2 mmol/L (ref 3.5–5.2)
Sodium: 143 mmol/L (ref 134–144)

## 2016-03-04 LAB — LIPID PANEL
CHOLESTEROL TOTAL: 212 mg/dL — AB (ref 100–199)
Chol/HDL Ratio: 3.2 ratio units (ref 0.0–4.4)
HDL: 66 mg/dL (ref >39)
LDL Calculated: 134 mg/dL — ABNORMAL HIGH (ref 0–99)
TRIGLYCERIDES: 58 mg/dL (ref 0–149)
VLDL CHOLESTEROL CAL: 12 mg/dL (ref 5–40)

## 2016-03-04 LAB — VITAMIN D 25 HYDROXY (VIT D DEFICIENCY, FRACTURES): Vit D, 25-Hydroxy: 41.4 ng/mL (ref 30.0–100.0)

## 2016-03-06 ENCOUNTER — Encounter: Payer: Self-pay | Admitting: Family Medicine

## 2016-04-05 ENCOUNTER — Ambulatory Visit: Payer: BLUE CROSS/BLUE SHIELD

## 2016-04-07 ENCOUNTER — Ambulatory Visit: Payer: BLUE CROSS/BLUE SHIELD

## 2016-05-05 DIAGNOSIS — Z01419 Encounter for gynecological examination (general) (routine) without abnormal findings: Secondary | ICD-10-CM | POA: Diagnosis not present

## 2016-05-05 DIAGNOSIS — Z1231 Encounter for screening mammogram for malignant neoplasm of breast: Secondary | ICD-10-CM | POA: Diagnosis not present

## 2016-05-12 ENCOUNTER — Other Ambulatory Visit: Payer: Self-pay | Admitting: Obstetrics and Gynecology

## 2016-05-12 DIAGNOSIS — R928 Other abnormal and inconclusive findings on diagnostic imaging of breast: Secondary | ICD-10-CM

## 2016-05-25 ENCOUNTER — Other Ambulatory Visit: Payer: Self-pay | Admitting: Obstetrics and Gynecology

## 2016-05-25 ENCOUNTER — Ambulatory Visit
Admission: RE | Admit: 2016-05-25 | Discharge: 2016-05-25 | Disposition: A | Discharge status: Home / Self Care | Payer: Medicare Other | Source: Ambulatory Visit | Attending: Obstetrics and Gynecology | Admitting: Obstetrics and Gynecology

## 2016-05-25 ENCOUNTER — Other Ambulatory Visit: Payer: Self-pay | Admitting: *Deleted

## 2016-05-25 ENCOUNTER — Telehealth: Payer: Self-pay | Admitting: Family Medicine

## 2016-05-25 DIAGNOSIS — M810 Age-related osteoporosis without current pathological fracture: Secondary | ICD-10-CM

## 2016-05-25 DIAGNOSIS — N6320 Unspecified lump in the left breast, unspecified quadrant: Secondary | ICD-10-CM

## 2016-05-25 DIAGNOSIS — R928 Other abnormal and inconclusive findings on diagnostic imaging of breast: Secondary | ICD-10-CM

## 2016-05-25 DIAGNOSIS — N63 Unspecified lump in breast: Secondary | ICD-10-CM | POA: Diagnosis not present

## 2016-05-25 DIAGNOSIS — Z780 Asymptomatic menopausal state: Secondary | ICD-10-CM

## 2016-05-25 NOTE — Telephone Encounter (Signed)
Bone Density scheduled for June 29th @ 2:15PM. Patient notified.

## 2016-05-25 NOTE — Telephone Encounter (Signed)
Pt is requesting a bone density test be ordered. Pt states that Dr. Nicki Reaper told her to call in June and have it scheduled.

## 2016-05-25 NOTE — Telephone Encounter (Signed)
[facility] - order has been put in. Need to know when pt can go for test.

## 2016-06-02 ENCOUNTER — Other Ambulatory Visit: Payer: Self-pay | Admitting: Obstetrics and Gynecology

## 2016-06-02 ENCOUNTER — Ambulatory Visit
Admission: RE | Admit: 2016-06-02 | Discharge: 2016-06-02 | Disposition: A | Discharge status: Home / Self Care | Payer: Medicare Other | Source: Ambulatory Visit | Attending: Obstetrics and Gynecology | Admitting: Obstetrics and Gynecology

## 2016-06-02 DIAGNOSIS — N6320 Unspecified lump in the left breast, unspecified quadrant: Secondary | ICD-10-CM

## 2016-06-02 DIAGNOSIS — N6002 Solitary cyst of left breast: Secondary | ICD-10-CM | POA: Diagnosis not present

## 2016-06-02 DIAGNOSIS — N63 Unspecified lump in breast: Secondary | ICD-10-CM | POA: Diagnosis not present

## 2016-06-02 HISTORY — PX: BREAST BIOPSY: SHX20

## 2016-06-08 ENCOUNTER — Ambulatory Visit (HOSPITAL_COMMUNITY)
Admission: RE | Admit: 2016-06-08 | Discharge: 2016-06-08 | Disposition: A | Discharge status: Home / Self Care | Payer: Medicare Other | Source: Ambulatory Visit | Attending: Family Medicine | Admitting: Family Medicine

## 2016-06-08 DIAGNOSIS — M810 Age-related osteoporosis without current pathological fracture: Secondary | ICD-10-CM | POA: Diagnosis not present

## 2016-06-08 DIAGNOSIS — Z780 Asymptomatic menopausal state: Secondary | ICD-10-CM | POA: Diagnosis present

## 2016-06-09 ENCOUNTER — Other Ambulatory Visit: Payer: Self-pay | Admitting: Family Medicine

## 2016-06-12 MED ORDER — LISINOPRIL 5 MG PO TABS: ORAL_TABLET | ORAL | Status: DC

## 2016-07-07 ENCOUNTER — Other Ambulatory Visit: Payer: Self-pay | Admitting: Family Medicine

## 2016-07-07 ENCOUNTER — Encounter: Payer: Self-pay | Admitting: Family Medicine

## 2016-07-07 ENCOUNTER — Ambulatory Visit (INDEPENDENT_AMBULATORY_CARE_PROVIDER_SITE_OTHER): Payer: Medicare Other | Admitting: Family Medicine

## 2016-07-07 VITALS — BP 118/80 | Temp 98.6°F | Ht 65.0 in | Wt 130.0 lb

## 2016-07-07 DIAGNOSIS — M810 Age-related osteoporosis without current pathological fracture: Secondary | ICD-10-CM | POA: Diagnosis not present

## 2016-07-07 DIAGNOSIS — R6884 Jaw pain: Secondary | ICD-10-CM | POA: Diagnosis not present

## 2016-07-07 DIAGNOSIS — H9209 Otalgia, unspecified ear: Secondary | ICD-10-CM

## 2016-07-07 MED ORDER — AMITRIPTYLINE HCL 10 MG PO TABS: ORAL_TABLET | ORAL | 3 refills | Status: DC

## 2016-07-07 MED ORDER — TRIAMCINOLONE ACETONIDE 0.1 % EX CREA
1.0000 | TOPICAL_CREAM | Freq: Two times a day (BID) | CUTANEOUS | 0 refills | Status: DC
Start: 2016-07-07 — End: 2018-01-11

## 2016-07-07 MED ORDER — GABAPENTIN 100 MG PO CAPS: 100.0000 mg | ORAL_CAPSULE | Freq: Three times a day (TID) | ORAL | 2 refills | Status: DC

## 2016-07-07 MED ORDER — LISINOPRIL 5 MG PO TABS: ORAL_TABLET | ORAL | 3 refills | Status: DC

## 2016-07-07 NOTE — Progress Notes (Addendum)
   Subjective:    Patient ID: Shelly Stein, female    DOB: [birth year], 70 y.o.   MRN: [id number]  Otalgia   There is pain in the left ear. This is a new problem. Episode onset: 2 weekss  There has been no fever. Associated symptoms comments: Jaw pain. Treatments tried: advil. The treatment provided no relief.   Patient relates jaw pain discomfort around the angle of jaw hurts when she chews when she opens her mouth wide she does not think it's TMJ she does have a history of osteoporosis and was on biphosphonate's for quite some time but this was stopped last summer for a drug holiday   Review of Systems  HENT: Positive for ear pain.        Objective:   Physical Exam There are no nodules on neck exam there is some tenderness in the jawline area ears are normal except for some excoriation of the skin       Assessment & Plan:  I doubt osteonecrosis of the jaw but this is a possibility patient had been on biphosphonate and Boniva for approximately 6 years currently I recommend anti-inflammatory over the next 10-14 days if not doing better patient notified us and we will set her up with ENT She will do jaw x-ray await the results I recommend a drug holiday from biphosphonate's for total 2 years this will be the spring of 2018. If the patient gets worse she is notify us  Dermatitis on the outer ear canal I recommend triamcinolone when necessary  I spoke with the radiologist regarding this patient's case they recommended a pain her Remicade x-ray of the jaw to make sure that there is no signs or findings in the left proximal jaw  This note was the product of Dr Sallee Lange

## 2016-07-14 ENCOUNTER — Ambulatory Visit (HOSPITAL_COMMUNITY)
Admission: RE | Admit: 2016-07-14 | Discharge: 2016-07-14 | Disposition: A | Discharge status: Home / Self Care | Payer: Medicare Other | Source: Ambulatory Visit | Attending: Family Medicine | Admitting: Family Medicine

## 2016-07-14 DIAGNOSIS — H9209 Otalgia, unspecified ear: Secondary | ICD-10-CM | POA: Diagnosis not present

## 2016-07-14 DIAGNOSIS — R6884 Jaw pain: Secondary | ICD-10-CM

## 2016-07-17 ENCOUNTER — Other Ambulatory Visit: Payer: Self-pay | Admitting: *Deleted

## 2016-07-21 ENCOUNTER — Telehealth: Payer: Self-pay | Admitting: Family Medicine

## 2016-07-21 DIAGNOSIS — H9202 Otalgia, left ear: Secondary | ICD-10-CM

## 2016-07-21 NOTE — Telephone Encounter (Signed)
Referral in system. Shelly Stein was notified.

## 2016-07-21 NOTE — Telephone Encounter (Signed)
Pt is requesting an ENT referral.

## 2016-08-09 ENCOUNTER — Encounter: Payer: Self-pay | Admitting: Family Medicine

## 2016-08-24 ENCOUNTER — Ambulatory Visit (INDEPENDENT_AMBULATORY_CARE_PROVIDER_SITE_OTHER): Payer: Medicare Other | Admitting: Otolaryngology

## 2016-08-24 DIAGNOSIS — H6121 Impacted cerumen, right ear: Secondary | ICD-10-CM

## 2016-08-24 DIAGNOSIS — H9202 Otalgia, left ear: Secondary | ICD-10-CM

## 2016-08-25 ENCOUNTER — Ambulatory Visit: Payer: Medicare Other | Admitting: Family Medicine

## 2016-08-25 DIAGNOSIS — H35372 Puckering of macula, left eye: Secondary | ICD-10-CM | POA: Diagnosis not present

## 2016-10-05 DIAGNOSIS — M5126 Other intervertebral disc displacement, lumbar region: Secondary | ICD-10-CM | POA: Diagnosis not present

## 2017-01-09 DIAGNOSIS — M5126 Other intervertebral disc displacement, lumbar region: Secondary | ICD-10-CM | POA: Diagnosis not present

## 2017-01-09 DIAGNOSIS — M810 Age-related osteoporosis without current pathological fracture: Secondary | ICD-10-CM | POA: Diagnosis not present

## 2017-01-16 ENCOUNTER — Other Ambulatory Visit: Payer: Self-pay | Admitting: Family Medicine

## 2017-03-02 ENCOUNTER — Other Ambulatory Visit: Payer: Self-pay | Admitting: Family Medicine

## 2017-03-02 DIAGNOSIS — Z1231 Encounter for screening mammogram for malignant neoplasm of breast: Secondary | ICD-10-CM

## 2017-03-07 ENCOUNTER — Encounter: Payer: Self-pay | Admitting: Gastroenterology

## 2017-03-07 ENCOUNTER — Other Ambulatory Visit (INDEPENDENT_AMBULATORY_CARE_PROVIDER_SITE_OTHER): Payer: Medicare Other

## 2017-03-07 ENCOUNTER — Ambulatory Visit (INDEPENDENT_AMBULATORY_CARE_PROVIDER_SITE_OTHER): Payer: Medicare Other | Admitting: Gastroenterology

## 2017-03-07 VITALS — BP 84/64 | HR 100 | Ht 64.0 in | Wt 120.2 lb

## 2017-03-07 DIAGNOSIS — K625 Hemorrhage of anus and rectum: Secondary | ICD-10-CM

## 2017-03-07 DIAGNOSIS — R1032 Left lower quadrant pain: Secondary | ICD-10-CM

## 2017-03-07 DIAGNOSIS — R112 Nausea with vomiting, unspecified: Secondary | ICD-10-CM

## 2017-03-07 LAB — CBC WITH DIFFERENTIAL/PLATELET
BASOS PCT: 0.8 % (ref 0.0–3.0)
Basophils Absolute: 0.1 10*3/uL (ref 0.0–0.1)
EOS PCT: 4.1 % (ref 0.0–5.0)
Eosinophils Absolute: 0.3 10*3/uL (ref 0.0–0.7)
HCT: 42.5 % (ref 36.0–46.0)
Hemoglobin: 14.3 g/dL (ref 12.0–15.0)
LYMPHS ABS: 1.6 10*3/uL (ref 0.7–4.0)
Lymphocytes Relative: 20.8 % (ref 12.0–46.0)
MCHC: 33.6 g/dL (ref 30.0–36.0)
MCV: 89.2 fl (ref 78.0–100.0)
MONO ABS: 0.6 10*3/uL (ref 0.1–1.0)
Monocytes Relative: 7.5 % (ref 3.0–12.0)
NEUTROS PCT: 66.8 % (ref 43.0–77.0)
Neutro Abs: 5.3 10*3/uL (ref 1.4–7.7)
Platelets: 228 10*3/uL (ref 150.0–400.0)
RBC: 4.76 Mil/uL (ref 3.87–5.11)
RDW: 12.8 % (ref 11.5–15.5)
WBC: 7.9 10*3/uL (ref 4.0–10.5)

## 2017-03-07 LAB — BASIC METABOLIC PANEL
BUN: 19 mg/dL (ref 6–23)
CO2: 29 mEq/L (ref 19–32)
CREATININE: 0.84 mg/dL (ref 0.40–1.20)
Calcium: 9.9 mg/dL (ref 8.4–10.5)
Chloride: 104 mEq/L (ref 96–112)
GFR: 71.11 mL/min (ref >60.00)
GLUCOSE: 128 mg/dL — AB (ref 70–99)
POTASSIUM: 4.2 meq/L (ref 3.5–5.1)
Sodium: 140 mEq/L (ref 135–145)

## 2017-03-07 MED ORDER — ONDANSETRON HCL 4 MG PO TABS
4.0000 mg | ORAL_TABLET | Freq: Four times a day (QID) | ORAL | 0 refills | Status: DC | PRN
Start: 2017-03-07 — End: 2017-04-02

## 2017-03-07 MED ORDER — METRONIDAZOLE 250 MG PO TABS: 250.0000 mg | ORAL_TABLET | Freq: Three times a day (TID) | ORAL | 0 refills | Status: DC

## 2017-03-07 MED ORDER — CIPROFLOXACIN HCL 500 MG PO TABS: 500.0000 mg | ORAL_TABLET | Freq: Two times a day (BID) | ORAL | 0 refills | Status: DC

## 2017-03-07 NOTE — Patient Instructions (Addendum)
If you are age 71 or older, your body mass index should be between 23-30. Your Body mass index is 20.64 kg/m. If this is out of the aforementioned range listed, please consider follow up with your Primary Care Provider.  If you are age 3 or younger, your body mass index should be between 19-25. Your Body mass index is 20.64 kg/m. If this is out of the aformentioned range listed, please consider follow up with your Primary Care Provider.   You have been scheduled for a CT scan of the abdomen and pelvis at [facility] ([address]---this is in the same building as [facility]).   You are scheduled on 3/30/18at 945 am. You should arrive 15 minutes prior to your appointment time for registration. Please follow the written instructions below on the day of your exam:  WARNING: IF YOU ARE ALLERGIC TO IODINE/X-RAY DYE, PLEASE NOTIFY RADIOLOGY IMMEDIATELY AT [phone]! YOU WILL BE GIVEN A 13 HOUR PREMEDICATION PREP.  1) Do not eat anything after 545 am (4 hours prior to your test) 2) You have been given 2 bottles of oral contrast to drink. The solution may taste               better if refrigerated, but do NOT add ice or any other liquid to this solution. Shake             well before drinking.    Drink 1 bottle of contrast @ 745 am (2 hours prior to your exam)  Drink 1 bottle of contrast @ 845 am (1 hour prior to your exam)  You may take any medications as prescribed with a small amount of water except for the following: Metformin, Glucophage, Glucovance, Avandamet, Riomet, Fortamet, Actoplus Met, Janumet, Glumetza or Metaglip. The above medications must be held the day of the exam AND 48 hours after the exam.  The purpose of you drinking the oral contrast is to aid in the visualization of your intestinal tract. The contrast solution may cause some diarrhea. Before your exam is started, you will be given a small amount of fluid to drink. Depending on your individual set of  symptoms, you may also receive an intravenous injection of x-ray contrast/dye. Plan on being at [facility] for 30 minutes or long, depending on the type of exam you are having performed.  If you have any questions regarding your exam or if you need to reschedule, you may call the CT department at [phone] between the hours of 8:00 am and 5:00 pm, Monday-Friday.  ________________________________________________________________________ We have sent the following medications to your pharmacy for you to pick up at your convenience: [place] [facility]  Your physician has requested that you go to the basement for the following lab work before leaving today: BMET CBC  Thank you for choosing me and [facility].

## 2017-03-07 NOTE — Progress Notes (Signed)
03/07/2017 KAILI CASTILLE [id number] [birth year]   HISTORY OF PRESENT ILLNESS:  Is a 71 year old female who is previously known to Dr. Velora Heckler and then Dr. Olevia Perches. Her last colonoscopy was in March 2012 at which time she is found have mild diverticulosis in the sigmoid colon and internal hemorrhoids. Repeat was recommended in 10 years from that time.  She presents to our office today with complaints of sudden onset nausea, vomiting, diarrhea, abdominal pain that began Friday night into Saturday morning. She says that following the onset of these symptoms she had at least 2 episodes where she passed a moderate amount of bright red blood into the toilet bowl without any stool. She said that she had some chills and took her temperature but it did not go over 100. She went on a liquid and then bland diet and has been feeling much better. She still has some residual nausea and abdominal pain. Abdominal pain is primarily in the left lower quadrant, but is sore diffusely. 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 She has never had any other similar issues besides that one episode. She's never experienced diverticulitis to her knowledge.   Past Medical History:  Diagnosis Date  . Back pain   . Diverticulosis    mild   Past Surgical History:  Procedure Laterality Date  . ABDOMINAL HYSTERECTOMY    . FOOT SURGERY    . SHOULDER SURGERY      reports that she has never smoked. She has never used smokeless tobacco. She reports that she does not drink alcohol or use drugs. family history includes Aneurysm in her father; Breast cancer in her mother; Heart disease in her mother. No Known Allergies    Outpatient Encounter Prescriptions as of 03/07/2017  Medication Sig  . amitriptyline (ELAVIL) 10 MG tablet TAKE 4 TABLETS AT BEDTIME  . Ascorbic Acid (VITAMIN C) 1000 MG tablet  Take 1,000 mg by mouth daily.  Marland Kitchen aspirin 81 MG tablet Take 81 mg by mouth daily.  . baclofen (LIORESAL) 10 MG tablet Take 10 mg by mouth 3 (three) times daily.  . Fish Oil OIL Take 1,000 mg by mouth.  . gabapentin (NEURONTIN) 100 MG capsule take 1 capsule by mouth three times a day  . lisinopril (PRINIVIL,ZESTRIL) 5 MG tablet TAKE 1/2 TABLET BY MOUTH  DAILY  . Multiple Vitamins-Minerals (MULTIVITAMIN WITH MINERALS) tablet Take 1 tablet by mouth daily.  Marland Kitchen triamcinolone cream (KENALOG) 0.1 % Apply 1 application topically 2 (two) times daily. Apply to outer ear as needed (Patient taking differently: Apply 1 application topically as needed. Apply to outer ear as needed)   No facility-administered encounter medications on file as of 03/07/2017.      REVIEW OF SYSTEMS  : All other systems reviewed and negative except where noted in the History of Present Illness.   PHYSICAL EXAM: BP (!) 84/64   Pulse 100   Ht 5\' 4"  (1.626 m)   Wt 120 lb 4 oz (54.5 kg)   BMI 20.64 kg/m  General: Well developed white female in no acute distress Head: Normocephalic and atraumatic Eyes:  Sclerae anicteric, conjunctiva pink. Ears: Normal auditory acuity Lungs: Clear throughout to auscultation Heart: Regular rate and rhythm Abdomen: Soft, non-distended. Normal bowel sounds.  Diffuse TTP but > in LLQ. Musculoskeletal: Symmetrical with no gross deformities  Skin: No  lesions on visible extremities Extremities: No edema  Neurological: Alert oriented x 4, grossly non-focal Psychological:  Alert and cooperative. Normal mood and affect  ASSESSMENT AND PLAN: -70 year old female with sudden onset of vomiting, diarrhea, and abdominal pain followed by passing moderate amounts of bright red blood.  She has a history of an episode of ischemic colitis in 12/2005 that was actually seen on CT scan.  All symptoms now resolved except for LLQ abdominal pain.  Also has diverticulosis on previous colonoscopy although bleeding is  not typical with diverticulitis.  Will start cipro 500 mg BID and flagyl 250 mg TID for 10 days empirically.  Will get a CT scan of the abdomen and pelvis with IV contrast as well to try to confirm diagnosis.  CBC and BMP today as well.  Will give zofran for some ongoing nausea.  Bland diet.  CC:  Kathyrn Drown, MD

## 2017-03-08 NOTE — Progress Notes (Signed)
Reviewed and agree with documentation and assessment and plan.  Patient may need to present to ER if her symptoms especially hematochezia becomes worse.  Damaris Hippo , MD

## 2017-03-09 ENCOUNTER — Ambulatory Visit (INDEPENDENT_AMBULATORY_CARE_PROVIDER_SITE_OTHER)
Admission: RE | Admit: 2017-03-09 | Discharge: 2017-03-09 | Disposition: A | Discharge status: Home / Self Care | Payer: Medicare Other | Source: Ambulatory Visit | Attending: Gastroenterology | Admitting: Gastroenterology

## 2017-03-09 DIAGNOSIS — R1032 Left lower quadrant pain: Secondary | ICD-10-CM | POA: Diagnosis not present

## 2017-03-09 DIAGNOSIS — K625 Hemorrhage of anus and rectum: Secondary | ICD-10-CM

## 2017-03-09 DIAGNOSIS — R112 Nausea with vomiting, unspecified: Secondary | ICD-10-CM

## 2017-03-09 DIAGNOSIS — N281 Cyst of kidney, acquired: Secondary | ICD-10-CM | POA: Diagnosis not present

## 2017-03-09 MED ORDER — IOPAMIDOL (ISOVUE-300) INJECTION 61%
100.0000 mL | Freq: Once | INTRAVENOUS | Status: DC | PRN
Start: 2017-03-09 — End: 2017-03-10

## 2017-04-02 ENCOUNTER — Ambulatory Visit (INDEPENDENT_AMBULATORY_CARE_PROVIDER_SITE_OTHER): Payer: Medicare Other | Admitting: Family Medicine

## 2017-04-02 ENCOUNTER — Encounter: Payer: Self-pay | Admitting: Family Medicine

## 2017-04-02 VITALS — BP 130/80 | Temp 98.3°F | Ht 65.0 in | Wt 119.1 lb

## 2017-04-02 DIAGNOSIS — I639 Cerebral infarction, unspecified: Secondary | ICD-10-CM | POA: Diagnosis not present

## 2017-04-02 DIAGNOSIS — R51 Headache: Secondary | ICD-10-CM | POA: Diagnosis not present

## 2017-04-02 DIAGNOSIS — R410 Disorientation, unspecified: Secondary | ICD-10-CM

## 2017-04-02 DIAGNOSIS — R270 Ataxia, unspecified: Secondary | ICD-10-CM | POA: Diagnosis not present

## 2017-04-02 DIAGNOSIS — R519 Headache, unspecified: Secondary | ICD-10-CM

## 2017-04-02 MED ORDER — DIAZEPAM 5 MG PO TABS: ORAL_TABLET | ORAL | 0 refills | Status: DC

## 2017-04-02 MED ORDER — ONDANSETRON HCL 4 MG PO TABS: 4.0000 mg | ORAL_TABLET | Freq: Three times a day (TID) | ORAL | 2 refills | Status: DC | PRN

## 2017-04-02 NOTE — Progress Notes (Signed)
   Subjective:    Patient ID: Shelly Stein, female    DOB: 01/12/1946, 71 y.o.   MRN: [id number]  Headache   This is a new problem. The current episode started 1 to 4 weeks ago. The problem occurs intermittently. The problem has been unchanged. The pain does not radiate. The quality of the pain is described as aching. The pain is moderate. Associated symptoms include dizziness and nausea. Nothing aggravates the symptoms. The treatment provided no relief.  The patient relates a proximally 8 days ago she had episode of disorientation ataxia and difficulty thinking. It lasted all day long and into the next day her family was concerned she was having a stroke because she didn't keep her balance and she was having mild headache along with nausea poor appetite disorientation and ataxia she states that disorientation and the ataxia seemed to get better but then she started having ongoing headache throughout the week she does not typically have headaches. She relates mild nausea but no vomiting she relates poor appetite no fever chills or sweats no cough wheezing or difficulty breathing PMH benign [phone]  Review of Systems  Gastrointestinal: Positive for nausea.  Neurological: Positive for dizziness and headaches.  Please see above     Objective:   Physical Exam Patient wavers with Romberg finger to nose normal walks without difficulty lungs are clear hearts regular pulse normal extremities no edema patient appropriately oriented patient does state that she has some difficulty with mental processing since this episode occurred       Assessment & Plan:  Patient with an episode concerning for the possibility of a stroke given that it lasted 24 hours to 36 hours and she still has headaches along with some mild cognitive difficulties with thinking. Also given the fact that she is having ongoing headaches with these spells need to rule out the possibility of a bleed and/or tumor and/or stroke we  will set up MRI patient will need Valium before her MRI family will drive her. Lab work ordered. Greater than 25 minutes spent with this patient evaluating her and discussing the aspects with her.

## 2017-04-03 ENCOUNTER — Ambulatory Visit: Payer: Medicare Other | Admitting: Family Medicine

## 2017-04-03 LAB — BASIC METABOLIC PANEL
BUN / CREAT RATIO: 12 (ref 12–28)
BUN: 9 mg/dL (ref 8–27)
CALCIUM: 9.8 mg/dL (ref 8.7–10.3)
CHLORIDE: 103 mmol/L (ref 96–106)
CO2: 26 mmol/L (ref 18–29)
Creatinine, Ser: 0.75 mg/dL (ref 0.57–1.00)
GFR, EST AFRICAN AMERICAN: 93 mL/min/{1.73_m2} (ref >59)
GFR, EST NON AFRICAN AMERICAN: 81 mL/min/{1.73_m2} (ref >59)
Glucose: 113 mg/dL — ABNORMAL HIGH (ref 65–99)
POTASSIUM: 4.1 mmol/L (ref 3.5–5.2)
Sodium: 144 mmol/L (ref 134–144)

## 2017-04-03 LAB — CBC WITH DIFFERENTIAL/PLATELET
BASOS: 0 %
Basophils Absolute: 0 10*3/uL (ref 0.0–0.2)
EOS (ABSOLUTE): 0.2 10*3/uL (ref 0.0–0.4)
EOS: 3 %
HEMATOCRIT: 43.2 % (ref 34.0–46.6)
Hemoglobin: 14.2 g/dL (ref 11.1–15.9)
IMMATURE GRANULOCYTES: 0 %
Immature Grans (Abs): 0 10*3/uL (ref 0.0–0.1)
LYMPHS ABS: 1.8 10*3/uL (ref 0.7–3.1)
Lymphs: 25 %
MCH: 29.2 pg (ref 26.6–33.0)
MCHC: 32.9 g/dL (ref 31.5–35.7)
MCV: 89 fL (ref 79–97)
MONOS ABS: 0.6 10*3/uL (ref 0.1–0.9)
Monocytes: 9 %
NEUTROS ABS: 4.4 10*3/uL (ref 1.4–7.0)
NEUTROS PCT: 63 %
PLATELETS: 246 10*3/uL (ref 150–379)
RBC: 4.87 x10E6/uL (ref 3.77–5.28)
RDW: 13.6 % (ref 12.3–15.4)
WBC: 7 10*3/uL (ref 3.4–10.8)

## 2017-04-03 LAB — HEPATIC FUNCTION PANEL
ALT: 6 IU/L (ref 0–32)
AST: 14 IU/L (ref 0–40)
Albumin: 4 g/dL (ref 3.5–4.8)
Alkaline Phosphatase: 45 IU/L (ref 39–117)
BILIRUBIN TOTAL: 0.3 mg/dL (ref 0.0–1.2)
Bilirubin, Direct: 0.1 mg/dL (ref 0.00–0.40)
Total Protein: 6.5 g/dL (ref 6.0–8.5)

## 2017-04-05 ENCOUNTER — Other Ambulatory Visit: Payer: Self-pay | Admitting: *Deleted

## 2017-04-05 ENCOUNTER — Ambulatory Visit (HOSPITAL_COMMUNITY)
Admission: RE | Admit: 2017-04-05 | Discharge: 2017-04-05 | Disposition: A | Discharge status: Home / Self Care | Payer: Medicare Other | Source: Ambulatory Visit | Attending: Family Medicine | Admitting: Family Medicine

## 2017-04-05 ENCOUNTER — Telehealth: Payer: Self-pay | Admitting: Family Medicine

## 2017-04-05 DIAGNOSIS — R51 Headache: Principal | ICD-10-CM

## 2017-04-05 DIAGNOSIS — I639 Cerebral infarction, unspecified: Secondary | ICD-10-CM

## 2017-04-05 DIAGNOSIS — R519 Headache, unspecified: Secondary | ICD-10-CM

## 2017-04-05 MED ORDER — GADOBENATE DIMEGLUMINE 529 MG/ML IV SOLN
10.0000 mL | Freq: Once | INTRAVENOUS | Status: AC | PRN
  Administered 2017-04-05: 10 mL via INTRAVENOUS

## 2017-04-05 NOTE — Telephone Encounter (Signed)
Nurse's-I am sorry for the late notice-I spoke with radiology this morning regarding this patient. Given her circumstances of possible stroke but with ongoing headache they recommend MRI with and without contrast in order to be able to pick up the possibility of stroke and or tumors. Please change MRI order to with and without contrast she has her MRI later today thank you

## 2017-04-05 NOTE — Telephone Encounter (Signed)
No precert needed. Scheduling notified.

## 2017-04-05 NOTE — Telephone Encounter (Signed)
Order changed. Await precert.

## 2017-04-06 ENCOUNTER — Other Ambulatory Visit: Payer: Self-pay

## 2017-04-06 DIAGNOSIS — R410 Disorientation, unspecified: Secondary | ICD-10-CM

## 2017-04-06 DIAGNOSIS — R270 Ataxia, unspecified: Secondary | ICD-10-CM

## 2017-04-06 DIAGNOSIS — R519 Headache, unspecified: Secondary | ICD-10-CM

## 2017-04-06 DIAGNOSIS — E041 Nontoxic single thyroid nodule: Secondary | ICD-10-CM

## 2017-04-06 DIAGNOSIS — R51 Headache: Secondary | ICD-10-CM

## 2017-04-12 ENCOUNTER — Ambulatory Visit (HOSPITAL_COMMUNITY)
Admission: RE | Admit: 2017-04-12 | Discharge: 2017-04-12 | Disposition: A | Discharge status: Home / Self Care | Payer: Medicare Other | Source: Ambulatory Visit | Attending: Family Medicine | Admitting: Family Medicine

## 2017-04-12 DIAGNOSIS — R270 Ataxia, unspecified: Secondary | ICD-10-CM | POA: Diagnosis present

## 2017-04-12 DIAGNOSIS — E042 Nontoxic multinodular goiter: Secondary | ICD-10-CM | POA: Diagnosis not present

## 2017-04-12 DIAGNOSIS — R51 Headache: Secondary | ICD-10-CM | POA: Diagnosis present

## 2017-04-12 DIAGNOSIS — R410 Disorientation, unspecified: Secondary | ICD-10-CM | POA: Diagnosis present

## 2017-04-20 ENCOUNTER — Ambulatory Visit (HOSPITAL_COMMUNITY)
Admission: RE | Admit: 2017-04-20 | Discharge: 2017-04-20 | Disposition: A | Discharge status: Home / Self Care | Payer: Medicare Other | Source: Ambulatory Visit | Attending: Family Medicine | Admitting: Family Medicine

## 2017-04-20 ENCOUNTER — Ambulatory Visit: Payer: Medicare Other

## 2017-04-20 DIAGNOSIS — E042 Nontoxic multinodular goiter: Secondary | ICD-10-CM | POA: Diagnosis not present

## 2017-04-20 DIAGNOSIS — R3915 Urgency of urination: Secondary | ICD-10-CM | POA: Diagnosis not present

## 2017-04-20 DIAGNOSIS — R3129 Other microscopic hematuria: Secondary | ICD-10-CM | POA: Diagnosis not present

## 2017-04-20 DIAGNOSIS — E041 Nontoxic single thyroid nodule: Secondary | ICD-10-CM | POA: Diagnosis present

## 2017-04-23 DIAGNOSIS — E041 Nontoxic single thyroid nodule: Secondary | ICD-10-CM | POA: Diagnosis not present

## 2017-04-24 LAB — T3: T3 TOTAL: 108 ng/dL (ref 71–180)

## 2017-04-24 LAB — TSH: TSH: 5 u[IU]/mL — ABNORMAL HIGH (ref 0.450–4.500)

## 2017-04-24 LAB — T4, FREE: FREE T4: 1.26 ng/dL (ref 0.82–1.77)

## 2017-04-30 ENCOUNTER — Encounter: Payer: Self-pay | Admitting: Family Medicine

## 2017-04-30 ENCOUNTER — Ambulatory Visit (INDEPENDENT_AMBULATORY_CARE_PROVIDER_SITE_OTHER): Payer: Medicare Other | Admitting: Family Medicine

## 2017-04-30 VITALS — BP 118/76 | Ht 65.0 in | Wt 120.8 lb

## 2017-04-30 DIAGNOSIS — I639 Cerebral infarction, unspecified: Secondary | ICD-10-CM | POA: Diagnosis not present

## 2017-04-30 DIAGNOSIS — E039 Hypothyroidism, unspecified: Secondary | ICD-10-CM

## 2017-04-30 DIAGNOSIS — E038 Other specified hypothyroidism: Secondary | ICD-10-CM

## 2017-04-30 NOTE — Progress Notes (Signed)
   Subjective:    Patient ID: Shelly Stein, female    DOB: 12-23-1945, 71 y.o.   MRN: [id number]  HPIFollow up thyroid nodule.  This patient was initially being evaluated for the possibility of a stroke had MRI and carotid Doppler studies which in fact showed a thyroid nodule then had thyroid ultrasound showed which detected multiple thyroid nodules but none of them concerning size none of them require any type of biopsy she had lab work done which showed a slight elevation of TSH patient overall is feeling well Pt states no other concerns today.     Review of Systems She denies fatigue tiredness headache fever chills vomiting weight loss    Objective:   Physical Exam  Lungs clear hearts regular pulse normal      Assessment & Plan:  Small thyroid nodules on her ultrasound noted them need any type of biopsy Subclinical hypothyroidism it would be advisable to repeat blood work in the fall rationale discussed

## 2017-05-11 ENCOUNTER — Ambulatory Visit
Admission: RE | Admit: 2017-05-11 | Discharge: 2017-05-11 | Disposition: A | Discharge status: Home / Self Care | Payer: Medicare Other | Source: Ambulatory Visit | Attending: Family Medicine | Admitting: Family Medicine

## 2017-05-11 DIAGNOSIS — Z1231 Encounter for screening mammogram for malignant neoplasm of breast: Secondary | ICD-10-CM | POA: Diagnosis not present

## 2017-05-11 DIAGNOSIS — Z01419 Encounter for gynecological examination (general) (routine) without abnormal findings: Secondary | ICD-10-CM | POA: Diagnosis not present

## 2017-05-28 DIAGNOSIS — Z01419 Encounter for gynecological examination (general) (routine) without abnormal findings: Secondary | ICD-10-CM | POA: Diagnosis not present

## 2017-07-03 ENCOUNTER — Other Ambulatory Visit: Payer: Self-pay

## 2017-07-03 DIAGNOSIS — M810 Age-related osteoporosis without current pathological fracture: Secondary | ICD-10-CM | POA: Diagnosis not present

## 2017-07-03 DIAGNOSIS — M5126 Other intervertebral disc displacement, lumbar region: Secondary | ICD-10-CM | POA: Diagnosis not present

## 2017-07-07 ENCOUNTER — Other Ambulatory Visit: Payer: Self-pay | Admitting: Family Medicine

## 2017-09-23 ENCOUNTER — Other Ambulatory Visit: Payer: Self-pay | Admitting: Family Medicine

## 2017-09-29 ENCOUNTER — Other Ambulatory Visit: Payer: Self-pay | Admitting: Family Medicine

## 2017-10-09 ENCOUNTER — Ambulatory Visit: Payer: Medicare Other | Admitting: Family Medicine

## 2017-10-29 ENCOUNTER — Encounter: Payer: Self-pay | Admitting: Family Medicine

## 2017-10-29 ENCOUNTER — Ambulatory Visit (INDEPENDENT_AMBULATORY_CARE_PROVIDER_SITE_OTHER): Payer: Medicare Other | Admitting: Family Medicine

## 2017-10-29 VITALS — BP 114/70 | Ht 65.0 in | Wt 119.2 lb

## 2017-10-29 DIAGNOSIS — R634 Abnormal weight loss: Secondary | ICD-10-CM

## 2017-10-29 DIAGNOSIS — E039 Hypothyroidism, unspecified: Secondary | ICD-10-CM | POA: Diagnosis not present

## 2017-10-29 DIAGNOSIS — I639 Cerebral infarction, unspecified: Secondary | ICD-10-CM

## 2017-10-29 DIAGNOSIS — E038 Other specified hypothyroidism: Secondary | ICD-10-CM

## 2017-10-29 DIAGNOSIS — R197 Diarrhea, unspecified: Secondary | ICD-10-CM

## 2017-10-29 NOTE — Progress Notes (Signed)
   Subjective:    Patient ID: Shelly Stein, female    DOB: [birth year], 71 y.o.   MRN: [id number]  HPI Patient in today for a follow up on Subclinical hypothyroidism. Patient has concerns of decreased appetite.  Patient relates almost everything she eats goes right through her causes diarrhea no mucus no blood she denies high fever chills denies any chest tightness pressure pain or shortness of breath she denies being depressed.  Still works. Review of Systems Relates she is not eating as much because of the diarrhea issues denies chest tightness pressure pain shortness breath denies fever chills sweats denies joint pain or swelling in the legs    Objective:   Physical Exam  Constitutional: She appears well-developed and well-nourished. No distress.  HENT:  Head: Normocephalic and atraumatic.  Eyes: Right eye exhibits no discharge. Left eye exhibits no discharge.  Neck: No tracheal deviation present.  Cardiovascular: Normal rate, regular rhythm and normal heart sounds.  No murmur heard. Pulmonary/Chest: Effort normal and breath sounds normal. No respiratory distress. She has no wheezes. She has no rales.  Musculoskeletal: She exhibits no edema.  Lymphadenopathy:    She has no cervical adenopathy.  Neurological: She is alert. She exhibits normal muscle tone.  Skin: Skin is warm and dry. No erythema.  Psychiatric: Her behavior is normal.  Vitals reviewed. Abdomen soft no guarding rebound no masses        Assessment & Plan:  Mild weight loss over the past year but very little weight loss in the past 6 months  Subclinical hypothyroidism recheck lab work  Possible IBS with diarrhea recommend low refuse diet if ongoing symptoms over the next few weeks referral to gastroenterology patient will give [place] an update on how she is doing

## 2017-10-30 LAB — BASIC METABOLIC PANEL
BUN / CREAT RATIO: 20 (ref 12–28)
BUN: 16 mg/dL (ref 8–27)
CALCIUM: 9.8 mg/dL (ref 8.7–10.3)
CHLORIDE: 105 mmol/L (ref 96–106)
CO2: 26 mmol/L (ref 20–29)
Creatinine, Ser: 0.79 mg/dL (ref 0.57–1.00)
GFR calc non Af Amer: 76 mL/min/{1.73_m2} (ref >59)
GFR, EST AFRICAN AMERICAN: 87 mL/min/{1.73_m2} (ref >59)
GLUCOSE: 99 mg/dL (ref 65–99)
POTASSIUM: 4.3 mmol/L (ref 3.5–5.2)
Sodium: 145 mmol/L — ABNORMAL HIGH (ref 134–144)

## 2017-10-30 LAB — CBC WITH DIFFERENTIAL/PLATELET
BASOS ABS: 0 10*3/uL (ref 0.0–0.2)
BASOS: 0 %
EOS (ABSOLUTE): 0.7 10*3/uL — ABNORMAL HIGH (ref 0.0–0.4)
Eos: 8 %
HEMOGLOBIN: 13.7 g/dL (ref 11.1–15.9)
Hematocrit: 41.7 % (ref 34.0–46.6)
IMMATURE GRANS (ABS): 0 10*3/uL (ref 0.0–0.1)
Immature Granulocytes: 0 %
LYMPHS ABS: 2.3 10*3/uL (ref 0.7–3.1)
LYMPHS: 28 %
MCH: 29.9 pg (ref 26.6–33.0)
MCHC: 32.9 g/dL (ref 31.5–35.7)
MCV: 91 fL (ref 79–97)
MONOCYTES: 8 %
Monocytes Absolute: 0.7 10*3/uL (ref 0.1–0.9)
NEUTROS ABS: 4.5 10*3/uL (ref 1.4–7.0)
Neutrophils: 56 %
Platelets: 190 10*3/uL (ref 150–379)
RBC: 4.58 x10E6/uL (ref 3.77–5.28)
RDW: 13.3 % (ref 12.3–15.4)
WBC: 8.1 10*3/uL (ref 3.4–10.8)

## 2017-10-30 LAB — TISSUE TRANSGLUTAMINASE, IGG: Tissue Transglut Ab: <2 U/mL (ref 0–5)

## 2017-10-30 LAB — T4, FREE: FREE T4: 1.25 ng/dL (ref 0.82–1.77)

## 2017-10-30 LAB — TSH: TSH: 1.35 u[IU]/mL (ref 0.450–4.500)

## 2017-11-09 DIAGNOSIS — H2513 Age-related nuclear cataract, bilateral: Secondary | ICD-10-CM | POA: Diagnosis not present

## 2017-12-25 DIAGNOSIS — M47816 Spondylosis without myelopathy or radiculopathy, lumbar region: Secondary | ICD-10-CM | POA: Diagnosis not present

## 2018-01-08 ENCOUNTER — Telehealth: Payer: Self-pay | Admitting: Family Medicine

## 2018-01-08 DIAGNOSIS — R110 Nausea: Secondary | ICD-10-CM

## 2018-01-08 NOTE — Telephone Encounter (Signed)
Referral made and pt is aware. She is aware she needs to follow up here.Transferred up front for an appt.

## 2018-01-08 NOTE — Telephone Encounter (Signed)
Ran fever last week had a scratchy throat and a cough, feels better now and no fever now. Please advise does she need to be seen by [place] or referral to GI ?

## 2018-01-08 NOTE — Telephone Encounter (Signed)
I recommend referral to gastroenterology Dr.Magod but he can often take a few weeks to get in with them therefore I would recommend a follow-up office visit later this week to recheck her in order any appropriate testing at that time

## 2018-01-08 NOTE — Telephone Encounter (Signed)
Pt called to state she's not feeling any better from OV in November Persistent nausea & abdominal pain, feeling weak, not wanting to eat  NTBS for tests or refer to GI?  If referring, pt would like to see Dr. Watt Climes  Please advise

## 2018-01-08 NOTE — Telephone Encounter (Signed)
I called left message to r/c for more information.

## 2018-01-11 ENCOUNTER — Ambulatory Visit (INDEPENDENT_AMBULATORY_CARE_PROVIDER_SITE_OTHER): Payer: Medicare Other | Admitting: Family Medicine

## 2018-01-11 VITALS — BP 112/72 | Ht 65.0 in | Wt 119.2 lb

## 2018-01-11 DIAGNOSIS — R1013 Epigastric pain: Secondary | ICD-10-CM

## 2018-01-11 DIAGNOSIS — R110 Nausea: Secondary | ICD-10-CM

## 2018-01-11 MED ORDER — ONDANSETRON HCL 8 MG PO TABS: 8.0000 mg | ORAL_TABLET | Freq: Three times a day (TID) | ORAL | 2 refills | Status: DC | PRN

## 2018-01-11 NOTE — Progress Notes (Signed)
   Subjective:    Patient ID: Shelly Stein, female    DOB: 20-Apr-1946, 72 y.o.   MRN: [id number]  HPI  Patient arrives for a follow up on abdominal pain. Patient has found no relief.  The patient relates having some intermittent nausea aspects in the epigastric region sometimes radiates to the right upper quadrant does not radiate to her back she also states whenever she eats a small amount she gets nauseous right away she gets full quickly in addition to this she states her energy level subpar she denies high fever chills sweats she denies bloody stools or hematemesis.  No chest tightness pressure pain  Review of Systems  Constitutional: Negative for activity change and appetite change.  HENT: Negative for congestion.   Respiratory: Negative for cough.   Cardiovascular: Negative for chest pain.  Gastrointestinal: Positive for nausea. Negative for abdominal pain, blood in stool and vomiting.  Skin: Negative for color change.  Neurological: Negative for weakness.  Psychiatric/Behavioral: Negative for confusion.       Objective:   Physical Exam  Constitutional: She appears well-developed and well-nourished. No distress.  HENT:  Head: Normocephalic and atraumatic.  Eyes: Right eye exhibits no discharge. Left eye exhibits no discharge.  Neck: No tracheal deviation present.  Cardiovascular: Normal rate, regular rhythm and normal heart sounds.  No murmur heard. Pulmonary/Chest: Effort normal and breath sounds normal. No respiratory distress. She has no wheezes. She has no rales.  Musculoskeletal: She exhibits no edema.  Lymphadenopathy:    She has no cervical adenopathy.  Neurological: She is alert. She exhibits normal muscle tone.  Skin: Skin is warm and dry. No erythema.  Psychiatric: Her behavior is normal.  Vitals reviewed.  Subjective discomfort in the upper epigastric region no guarding rebound or tenderness       Assessment & Plan:  Probable dyspepsia in addition to this  cannot rule out the possibility of a gastric lesion or ulcer.  This patient will need several different things.  I recommend additional lab work to look at liver enzymes pancreatic enzymes in addition to this ultrasound to look for any type of lesions or growths within the pancreas plus also referral to gastroenterology for further evaluation and EGD.  Patient may well benefit from having a CAT scan by the gastroenterologist but starting off with these tests first is the most reasonable Zofran as needed nausea

## 2018-01-12 LAB — HEPATIC FUNCTION PANEL
ALT: 22 IU/L (ref 0–32)
AST: 29 IU/L (ref 0–40)
Albumin: 3.9 g/dL (ref 3.5–4.8)
Alkaline Phosphatase: 39 IU/L (ref 39–117)
Bilirubin Total: 0.4 mg/dL (ref 0.0–1.2)
Bilirubin, Direct: 0.1 mg/dL (ref 0.00–0.40)
Total Protein: 6.4 g/dL (ref 6.0–8.5)

## 2018-01-12 LAB — AMYLASE: Amylase: 62 U/L (ref 31–124)

## 2018-01-17 ENCOUNTER — Ambulatory Visit (HOSPITAL_COMMUNITY)
Admission: RE | Admit: 2018-01-17 | Discharge: 2018-01-17 | Disposition: A | Discharge status: Home / Self Care | Payer: Medicare Other | Source: Ambulatory Visit | Attending: Family Medicine | Admitting: Family Medicine

## 2018-01-17 DIAGNOSIS — R110 Nausea: Secondary | ICD-10-CM | POA: Insufficient documentation

## 2018-01-17 DIAGNOSIS — R932 Abnormal findings on diagnostic imaging of liver and biliary tract: Secondary | ICD-10-CM | POA: Insufficient documentation

## 2018-01-17 DIAGNOSIS — R1013 Epigastric pain: Secondary | ICD-10-CM | POA: Diagnosis not present

## 2018-01-17 DIAGNOSIS — R1011 Right upper quadrant pain: Secondary | ICD-10-CM | POA: Diagnosis not present

## 2018-01-31 DIAGNOSIS — R1031 Right lower quadrant pain: Secondary | ICD-10-CM | POA: Diagnosis not present

## 2018-01-31 DIAGNOSIS — R110 Nausea: Secondary | ICD-10-CM | POA: Diagnosis not present

## 2018-01-31 DIAGNOSIS — K921 Melena: Secondary | ICD-10-CM | POA: Diagnosis not present

## 2018-01-31 DIAGNOSIS — Z791 Long term (current) use of non-steroidal anti-inflammatories (NSAID): Secondary | ICD-10-CM | POA: Diagnosis not present

## 2018-01-31 DIAGNOSIS — R198 Other specified symptoms and signs involving the digestive system and abdomen: Secondary | ICD-10-CM | POA: Diagnosis not present

## 2018-02-01 ENCOUNTER — Other Ambulatory Visit: Payer: Self-pay | Admitting: Gastroenterology

## 2018-02-01 DIAGNOSIS — R1031 Right lower quadrant pain: Secondary | ICD-10-CM

## 2018-02-07 DIAGNOSIS — K2930 Chronic superficial gastritis without bleeding: Secondary | ICD-10-CM | POA: Diagnosis not present

## 2018-02-07 DIAGNOSIS — K921 Melena: Secondary | ICD-10-CM | POA: Diagnosis not present

## 2018-02-07 DIAGNOSIS — R110 Nausea: Secondary | ICD-10-CM | POA: Diagnosis not present

## 2018-02-12 ENCOUNTER — Other Ambulatory Visit: Payer: Medicare Other

## 2018-02-13 DIAGNOSIS — K2930 Chronic superficial gastritis without bleeding: Secondary | ICD-10-CM | POA: Diagnosis not present

## 2018-02-27 ENCOUNTER — Ambulatory Visit
Admission: RE | Admit: 2018-02-27 | Discharge: 2018-02-27 | Disposition: A | Discharge status: Home / Self Care | Payer: Medicare Other | Source: Ambulatory Visit | Attending: Gastroenterology | Admitting: Gastroenterology

## 2018-02-27 DIAGNOSIS — R1031 Right lower quadrant pain: Secondary | ICD-10-CM

## 2018-02-27 DIAGNOSIS — K529 Noninfective gastroenteritis and colitis, unspecified: Secondary | ICD-10-CM | POA: Diagnosis not present

## 2018-02-27 MED ORDER — IOPAMIDOL (ISOVUE-300) INJECTION 61%
100.0000 mL | Freq: Once | INTRAVENOUS | Status: AC | PRN
  Administered 2018-02-27: 100 mL via INTRAVENOUS

## 2018-04-02 ENCOUNTER — Other Ambulatory Visit: Payer: Self-pay | Admitting: Obstetrics and Gynecology

## 2018-04-02 DIAGNOSIS — Z1231 Encounter for screening mammogram for malignant neoplasm of breast: Secondary | ICD-10-CM

## 2018-04-11 ENCOUNTER — Other Ambulatory Visit: Payer: Self-pay | Admitting: Family Medicine

## 2018-05-08 DIAGNOSIS — R3915 Urgency of urination: Secondary | ICD-10-CM | POA: Diagnosis not present

## 2018-05-08 DIAGNOSIS — K2970 Gastritis, unspecified, without bleeding: Secondary | ICD-10-CM | POA: Diagnosis not present

## 2018-05-08 DIAGNOSIS — R110 Nausea: Secondary | ICD-10-CM | POA: Diagnosis not present

## 2018-05-10 ENCOUNTER — Ambulatory Visit (HOSPITAL_COMMUNITY)
Admission: RE | Admit: 2018-05-10 | Discharge: 2018-05-10 | Disposition: A | Discharge status: Home / Self Care | Payer: Medicare Other | Source: Ambulatory Visit | Attending: Family Medicine | Admitting: Family Medicine

## 2018-05-10 ENCOUNTER — Ambulatory Visit (INDEPENDENT_AMBULATORY_CARE_PROVIDER_SITE_OTHER): Payer: Medicare Other | Admitting: Family Medicine

## 2018-05-10 ENCOUNTER — Telehealth: Payer: Self-pay | Admitting: *Deleted

## 2018-05-10 ENCOUNTER — Encounter: Payer: Self-pay | Admitting: Family Medicine

## 2018-05-10 VITALS — BP 108/62 | Temp 98.4°F | Ht 65.0 in | Wt 124.0 lb

## 2018-05-10 DIAGNOSIS — M50321 Other cervical disc degeneration at C4-C5 level: Secondary | ICD-10-CM | POA: Diagnosis not present

## 2018-05-10 DIAGNOSIS — M542 Cervicalgia: Secondary | ICD-10-CM

## 2018-05-10 DIAGNOSIS — M4802 Spinal stenosis, cervical region: Secondary | ICD-10-CM | POA: Insufficient documentation

## 2018-05-10 DIAGNOSIS — M47812 Spondylosis without myelopathy or radiculopathy, cervical region: Secondary | ICD-10-CM | POA: Insufficient documentation

## 2018-05-10 DIAGNOSIS — M4313 Spondylolisthesis, cervicothoracic region: Secondary | ICD-10-CM | POA: Insufficient documentation

## 2018-05-10 MED ORDER — PREDNISONE 10 MG PO TABS: ORAL_TABLET | ORAL | 0 refills | Status: DC

## 2018-05-10 NOTE — Progress Notes (Signed)
   Subjective:    Patient ID: Shelly Stein, female    DOB: 1946-07-16, 72 y.o.   MRN: [id number]  HPI  Patient is here today due to neck pain. She states it is ongoing for around 3-4 weeks.She does not remember injuring. She has been taking extra strength tylenol, biofreeze, heat and cold and neck brace. She also has a headache off and on since the neck pain.  She is wearing a collar today she states that helps her feel better She has had progressive neck pain over the past few weeks Does not tolerate anti-inflammatories because it causes some GI upset and has had a threatened bleeding ulcer in the past Review of Systems Currently denies any chest tightness pressure pain shortness of breath does relate neck pain does not radiate down the arms denies any abdominal pain    Objective:   Physical Exam  Lungs are clear respiratory rate normal heart is regular no murmurs pulse normal good strength both arms decreased range of motion looking left and right subjective discomfort in the back of her neck      Assessment & Plan:  Cervical pain Gentle range of motion exercises were shown Is not tolerated because of his GI side effects Short course prednisone X-rays ordered await the results Follow-up if problems

## 2018-05-10 NOTE — Telephone Encounter (Signed)
Patient is having ongoing neck pain. Patient tried acupuncture and it did not help. Patient scheduled office visit for evaluation and treatment.

## 2018-05-10 NOTE — Telephone Encounter (Signed)
Patient called stating she has been having trouble with her neck, patient asked for the nurse to call her to discuss it. Patient would like to know if a xray can be done. Please advise [phone]

## 2018-05-13 ENCOUNTER — Other Ambulatory Visit: Payer: Self-pay | Admitting: Family Medicine

## 2018-05-13 ENCOUNTER — Ambulatory Visit (HOSPITAL_COMMUNITY)
Admission: RE | Admit: 2018-05-13 | Discharge: 2018-05-13 | Disposition: A | Discharge status: Home / Self Care | Payer: Medicare Other | Source: Ambulatory Visit | Attending: Family Medicine | Admitting: Family Medicine

## 2018-05-13 ENCOUNTER — Ambulatory Visit (HOSPITAL_COMMUNITY): Payer: Medicare Other

## 2018-05-13 DIAGNOSIS — M532X3 Spinal instabilities, cervicothoracic region: Secondary | ICD-10-CM | POA: Insufficient documentation

## 2018-05-13 DIAGNOSIS — R52 Pain, unspecified: Secondary | ICD-10-CM

## 2018-05-13 DIAGNOSIS — M542 Cervicalgia: Secondary | ICD-10-CM

## 2018-05-13 DIAGNOSIS — M810 Age-related osteoporosis without current pathological fracture: Secondary | ICD-10-CM

## 2018-05-17 ENCOUNTER — Ambulatory Visit
Admission: RE | Admit: 2018-05-17 | Discharge: 2018-05-17 | Disposition: A | Discharge status: Home / Self Care | Payer: Medicare Other | Source: Ambulatory Visit | Attending: Obstetrics and Gynecology | Admitting: Obstetrics and Gynecology

## 2018-05-17 DIAGNOSIS — Z01419 Encounter for gynecological examination (general) (routine) without abnormal findings: Secondary | ICD-10-CM | POA: Diagnosis not present

## 2018-05-17 DIAGNOSIS — Z1231 Encounter for screening mammogram for malignant neoplasm of breast: Secondary | ICD-10-CM | POA: Diagnosis not present

## 2018-05-24 ENCOUNTER — Other Ambulatory Visit: Payer: Self-pay | Admitting: *Deleted

## 2018-05-24 DIAGNOSIS — M542 Cervicalgia: Secondary | ICD-10-CM

## 2018-06-03 ENCOUNTER — Encounter (INDEPENDENT_AMBULATORY_CARE_PROVIDER_SITE_OTHER): Payer: Self-pay

## 2018-06-03 ENCOUNTER — Encounter: Payer: Self-pay | Admitting: Family Medicine

## 2018-06-06 ENCOUNTER — Encounter (HOSPITAL_COMMUNITY): Payer: Self-pay

## 2018-06-06 ENCOUNTER — Ambulatory Visit (HOSPITAL_COMMUNITY): Payer: Medicare Other | Attending: Family Medicine

## 2018-06-06 ENCOUNTER — Other Ambulatory Visit: Payer: Self-pay

## 2018-06-06 DIAGNOSIS — M542 Cervicalgia: Secondary | ICD-10-CM | POA: Diagnosis not present

## 2018-06-06 DIAGNOSIS — R29898 Other symptoms and signs involving the musculoskeletal system: Secondary | ICD-10-CM | POA: Insufficient documentation

## 2018-06-06 DIAGNOSIS — R293 Abnormal posture: Secondary | ICD-10-CM | POA: Insufficient documentation

## 2018-06-06 NOTE — Therapy (Signed)
[place], [name], [address] Phone: [phone]   Fax:  [phone]  Physical Therapy Evaluation  Patient Details  Name: Shelly Stein MRN: [id number] Date of Birth: 05/02/1946 Referring Provider: Sallee Lange, MD   Encounter Date: 06/06/2018  PT End of Session - 06/06/18 1621    Visit Number  1    Number of Visits  9    Date for PT Re-Evaluation  07/04/18    Authorization Type  Medicare    Authorization Time Period  06/06/18 to 07/04/18    Authorization - Visit Number  1    Authorization - Number of Visits  10    PT Start Time  1430    PT Stop Time  1515    PT Time Calculation (min)  45 min    Activity Tolerance  Patient tolerated treatment well    Behavior During Therapy  [facility] for tasks assessed/performed       Past Medical History:  Diagnosis Date  . Back pain   . Diverticulosis    mild    Past Surgical History:  Procedure Laterality Date  . ABDOMINAL HYSTERECTOMY    . BREAST BIOPSY Left 06/02/2016  . FOOT SURGERY    . SHOULDER SURGERY      There were no vitals filed for this visit.   Subjective Assessment - 06/06/18 1433    Subjective  Pt states that she has been having neck pain for about 6 weeks. She is not sure of what caused it. She reports having osteoporosis; she is having an updated bone density scan. She states that it was so bad that she couldn't turn her neck either direction, but this has since improved some. She is not a candidate for surgery due to her osteoporosis. She denies any radicular symptoms but states that she is to call the MD immediately if she does. She has had acupuncture and they gave her a soft collar to wear; she wears it multiple hours in a day on and off. She reports currently having the most difficulty with ROM and pain. Her sleep is disturbed but she states that if she sleeps with a neck pillow and ice, she can tolerate it.     Limitations  House hold activities    How long can you sit comfortably?  no issues    How long can you stand comfortably?  no issues    How long can you walk comfortably?  no issues     Diagnostic tests  x-rays    Patient Stated Goals  get some relief    Currently in Pain?  Yes    Pain Score  6     Pain Location  Neck    Pain Orientation  Right;Left    Pain Descriptors / Indicators  Aching    Pain Type  Acute pain    Pain Onset  More than a month ago    Pain Frequency  Constant    Aggravating Factors   nothing    Pain Relieving Factors  ice    Effect of Pain on Daily Activities  severely impacts         [facility] PT Assessment - 06/06/18 0001      Assessment   Medical Diagnosis  Neck pain    Referring Provider  Sallee Lange, MD    Next MD Visit  about a month, will call to set up f/u appointment Glenna Fellows, MD (neurosurgeon following pt) 07/09/18)  Balance Screen   Has the patient fallen in the past 6 months  No    Has the patient had a decrease in activity level because of a fear of falling?   No    Is the patient reluctant to leave their home because of a fear of falling?   No      Prior Function   Level of Independence  Independent    Vocation  Part time employment    [employer] - office work/desk job    Leisure  travel, reading      Observation/Other Assessments   Focus on Therapeutic Outcomes (FOTO)   52% limitation    Other Surveys   Other Surveys    Neck Disability Index   23/50      Sensation   Light Touch  Appears Intact      Functional Tests   Functional tests  Other      Other:   Other/ Comments  DNFE test: 15sec       Posture/Postural Control   Posture/Postural Control  Postural limitations    Postural Limitations  Rounded Shoulders;Forward head;Increased thoracic kyphosis      ROM / Strength   AROM / PROM / Strength  AROM;Strength      AROM   AROM Assessment Site  Cervical    Cervical Flexion  53    Cervical Extension  43    Cervical - Right Side Bend   10    Cervical - Left Side Bend  18    Cervical - Right Rotation  47    Cervical - Left Rotation  54      Strength   Strength Assessment Site  Shoulder;Elbow;Wrist;Hand    Right Shoulder Flexion  4-/5    Right Shoulder ABduction  4-/5    Right Shoulder Internal Rotation  4+/5    Right Shoulder External Rotation  4+/5    Left Shoulder Flexion  4/5    Left Shoulder ABduction  4-/5    Left Shoulder Internal Rotation  5/5    Left Shoulder External Rotation  4+/5    Right Elbow Flexion  5/5    Right Elbow Extension  5/5    Left Elbow Flexion  5/5    Left Elbow Extension  5/5    Right Wrist Flexion  5/5    Right Wrist Extension  5/5    Left Wrist Flexion  5/5    Left Wrist Extension  5/5    Right Hand Gross Grasp  Impaired    Right Hand Grip (lbs)  33    Left Hand Gross Grasp  Impaired    Left Hand Grip (lbs)  25      Palpation   Spinal mobility  did not assess due to osteoporosis    Palpation comment  max restrictions of bil upper trap, levator scap, cervical and thoracic paraspinals, and SCM; all recreatd her same neck pain and tender to palpation            Objective measurements completed on examination: See above findings.          PT Education - 06/06/18 1517    Education Details  exam findings, POC, HEP    Person(s) Educated  Patient    Methods  Explanation;Demonstration;Handout    Comprehension  Verbalized understanding;Returned demonstration       PT Short Term Goals - 06/06/18 1627      PT SHORT TERM GOAL #1  Title  Pt will be independent with HEP and perform consistently in order to improve ROM and decrease pain.    Time  2    Period  Weeks    Status  New    Target Date  06/20/18      PT SHORT TERM GOAL #2   Title  Pt will have improved cervical AROM by 5 deg throughout in order to decrease pain and improve overall function.    Time  2    Period  Weeks    Status  New      PT SHORT TERM GOAL #3   Title  Pt will score 14/50 or < on the NDI  in order to demo mild self-perceived disability due to her neck pain.    Time  2    Period  Weeks    Status  New        PT Long Term Goals - 06/06/18 1631      PT LONG TERM GOAL #1   Title  Pt will have improved cervical AROM by 10deg or > throughout in order to further decrease pain and maximzie her ability to drive with greater ease.    Time  4    Period  Weeks    Status  New    Target Date  07/04/18      PT LONG TERM GOAL #2   Title  Pt will report decreased neck pain to 3/10 or better intermittently in order to allow her to complete functional tasks with greater ease and promote return to PLOF.    Time  4    Period  Weeks    Status  New      PT LONG TERM GOAL #3   Title  Pt will report being able to sleep through the night and awaken 2x or < during the night due to pain in order to maximize her recovery.    Time  4    Period  Weeks    Status  New      PT LONG TERM GOAL #4   Title  Pt will have decreased cervical and periscapular musculature restrictions to moderate in order to maximize her ROM and decrease her pain.    Time  4    Period  Weeks    Status  New      PT LONG TERM GOAL #5   Title  Pt will be able to perform the Deep Neck Flexor Endurace Test for 30 sec with proper form and without pain in order to demo improved functional neck strength.    Time  4    Period  Weeks    Status  New             Plan - 06/06/18 1622    Clinical Impression Statement  Pt is pleasant 72YO F who present to OPPT with c/o acute bil neck pain for the past 6 weeks of insidious onset. Pt currently presents with deficits in cervical ROM, cervical strength, posture, pain, and difficulty completing functional tasks as well as increased soft tissue restrictions and tenderness to palpation. She does not have any radicular symptoms and her myotomes and dermatomes were WFL. Pt has osteoporosis so PT did not assess spinal mobility, Spurling's test, or Distraction test. Pt reporting that  palpation to upper trap, levator scap, SCM, and cervical and thoracic paraspinals all recreated her same pain. Pt needs skilled PT intervention to address these impairments in order to decrease her pain  and improve overall function.    History and Personal Factors relevant to plan of care:  has osteoporosis (no traction and would highly recommend not performing joint mobs)    Clinical Presentation  Stable    Clinical Presentation due to:  ROM, MMT, DNFE test, soft tissue restrictions, posture, pain    Clinical Decision Making  Low    Rehab Potential  Fair    PT Frequency  2x / week    PT Duration  4 weeks    PT Treatment/Interventions  ADLs/Self Care Home Management;Biofeedback;Cryotherapy;Electrical Stimulation;Moist Heat;Ultrasound;DME Instruction;Functional mobility training;Therapeutic activities;Balance training;Neuromuscular re-education;Cognitive remediation;Patient/family education;Manual techniques;Scar mobilization;Passive range of motion;Dry needling;Energy conservation;Taping    PT Next Visit Plan  review goals and HEP, postural strengthenig and education, capital flexion and rotation, improve cervical ROM, manual for soft tissue restricitons, potentially dry needling for upper trap and levator scap (caution with paraspinals due to osteoporosis)    PT Home Exercise Plan  eval: supine cervical retractions    Consulted and Agree with Plan of Care  Patient       Patient will benefit from skilled therapeutic intervention in order to improve the following deficits and impairments:  Decreased mobility, Decreased range of motion, Decreased strength, Hypomobility, Increased fascial restricitons, Increased muscle spasms, Impaired flexibility, Improper body mechanics, Postural dysfunction, Pain  Visit Diagnosis: Cervicalgia - Plan: PT plan of care cert/re-cert  Abnormal posture - Plan: PT plan of care cert/re-cert  Other symptoms and signs involving the musculoskeletal system - Plan: PT plan  of care cert/re-cert     Problem List Patient Active Problem List   Diagnosis Date Noted  . Abdominal pain, left lower quadrant 03/07/2017  . Nausea and vomiting in adult 03/07/2017  . Rectal bleeding 03/07/2017  . Sciatica of left side 12/12/2013  . Osteoporosis 06/28/2011       Geraldine Solar PT, DPT  [facility] [address] [place], [name], [address] Phone: [phone]   Fax:  [phone]  Name: Shelly Stein MRN: [id number] Date of Birth: Jan 11, 1946

## 2018-06-11 ENCOUNTER — Ambulatory Visit (HOSPITAL_COMMUNITY): Payer: Medicare Other | Attending: Family Medicine | Admitting: Physical Therapy

## 2018-06-11 DIAGNOSIS — R293 Abnormal posture: Secondary | ICD-10-CM

## 2018-06-11 DIAGNOSIS — R29898 Other symptoms and signs involving the musculoskeletal system: Secondary | ICD-10-CM | POA: Insufficient documentation

## 2018-06-11 DIAGNOSIS — M542 Cervicalgia: Secondary | ICD-10-CM | POA: Insufficient documentation

## 2018-06-11 NOTE — Therapy (Signed)
[place], [name], [address] Phone: [phone]   Fax:  [phone]  Physical Therapy Treatment  Patient Details  Name: Shelly Stein MRN: [id number] Date of Birth: 02-02-1946 Referring Provider: Sallee Lange, MD   Encounter Date: 06/11/2018  PT End of Session - 06/11/18 1646    Visit Number  2    Number of Visits  9    Date for PT Re-Evaluation  07/04/18    Authorization Type  Medicare    Authorization Time Period  06/06/18 to 07/04/18    Authorization - Visit Number  2    Authorization - Number of Visits  10    PT Start Time  1600    PT Stop Time  1638    PT Time Calculation (min)  38 min    Activity Tolerance  Patient tolerated treatment well    Behavior During Therapy  [facility] for tasks assessed/performed       Past Medical History:  Diagnosis Date  . Back pain   . Diverticulosis    mild    Past Surgical History:  Procedure Laterality Date  . ABDOMINAL HYSTERECTOMY    . BREAST BIOPSY Left 06/02/2016  . FOOT SURGERY    . SHOULDER SURGERY      There were no vitals filed for this visit.  Subjective Assessment - 06/11/18 1609    Subjective  Pt states her neck is worse today than it was last visit.  STates her pain is now on her Rt side and has no idea how the pain increased but woke this morning and couldn't move her head to the Rt.  Currently 8/10 pain    Currently in Pain?  Yes    Pain Score  8     Pain Location  Neck    Pain Orientation  Right    Pain Descriptors / Indicators  Aching    Pain Type  Acute pain                       [facility] Adult PT Treatment/Exercise - 06/11/18 0001      Neck Exercises: Seated   Neck Retraction  5 reps    Cervical Rotation  5 reps    Lateral Flexion  5 reps      Manual Therapy   Manual Therapy  Soft tissue mobilization    Manual therapy comments  completed seperately from all other skilled itnerventions    Soft tissue mobilization  to  cervical region, Rt>Lt in seated postion with UE support      Neck Exercises: Stretches   Upper Trapezius Stretch  Right;2 reps;20 seconds    Upper Trapezius Stretch Limitations  with UE crossover             PT Education - 06/11/18 1651    Education Details  reviewed evalution, goals and HEP    Person(s) Educated  Patient    Methods  Explanation;Demonstration;Tactile cues;Verbal cues;Handout    Comprehension  Verbalized understanding;Returned demonstration;Tactile cues required;Verbal cues required       PT Short Term Goals - 06/06/18 1627      PT SHORT TERM GOAL #1   Title  Pt will be independent with HEP and perform consistently in order to improve ROM and decrease pain.    Time  2    Period  Weeks    Status  New    Target Date  06/20/18  PT SHORT TERM GOAL #2   Title  Pt will have improved cervical AROM by 5 deg throughout in order to decrease pain and improve overall function.    Time  2    Period  Weeks    Status  New      PT SHORT TERM GOAL #3   Title  Pt will score 14/50 or < on the NDI in order to demo mild self-perceived disability due to her neck pain.    Time  2    Period  Weeks    Status  New        PT Long Term Goals - 06/06/18 1631      PT LONG TERM GOAL #1   Title  Pt will have improved cervical AROM by 10deg or > throughout in order to further decrease pain and maximzie her ability to drive with greater ease.    Time  4    Period  Weeks    Status  New    Target Date  07/04/18      PT LONG TERM GOAL #2   Title  Pt will report decreased neck pain to 3/10 or better intermittently in order to allow her to complete functional tasks with greater ease and promote return to PLOF.    Time  4    Period  Weeks    Status  New      PT LONG TERM GOAL #3   Title  Pt will report being able to sleep through the night and awaken 2x or < during the night due to pain in order to maximize her recovery.    Time  4    Period  Weeks    Status  New       PT LONG TERM GOAL #4   Title  Pt will have decreased cervical and periscapular musculature restrictions to moderate in order to maximize her ROM and decrease her pain.    Time  4    Period  Weeks    Status  New      PT LONG TERM GOAL #5   Title  Pt will be able to perform the Deep Neck Flexor Endurace Test for 30 sec with proper form and without pain in order to demo improved functional neck strength.    Time  4    Period  Weeks    Status  New            Plan - 06/11/18 1646    Clinical Impression Statement  Pt in noted paint this session with difficulty completing AROM, especially Rt rotation and c/o pain with all movements.  Began gentle cervical ROM and scapular strengthening exericses. Spent majority of time on manual to help reduce pain.  Pt with multiple spasms bilateral UT and cervical region, more notable on Lt than Rt.  Able to reduce pain to 5/10 and exhibit improved ROM at end of session.      Rehab Potential  Fair    PT Frequency  2x / week    PT Duration  4 weeks    PT Treatment/Interventions  ADLs/Self Care Home Management;Biofeedback;Cryotherapy;Electrical Stimulation;Moist Heat;Ultrasound;DME Instruction;Functional mobility training;Therapeutic activities;Balance training;Neuromuscular re-education;Cognitive remediation;Patient/family education;Manual techniques;Scar mobilization;Passive range of motion;Dry needling;Energy conservation;Taping    PT Next Visit Plan  Continue to work on strengthenig and postural education.  Manual for soft tissue restricitons, potentially dry needling for upper trap and levator scap (caution with paraspinals due to osteoporosis)    PT Home Exercise Plan  eval: supine cervical retractions    Consulted and Agree with Plan of Care  Patient       Patient will benefit from skilled therapeutic intervention in order to improve the following deficits and impairments:  Decreased mobility, Decreased range of motion, Decreased strength,  Hypomobility, Increased fascial restricitons, Increased muscle spasms, Impaired flexibility, Improper body mechanics, Postural dysfunction, Pain  Visit Diagnosis: Cervicalgia  Abnormal posture  Other symptoms and signs involving the musculoskeletal system     Problem List Patient Active Problem List   Diagnosis Date Noted  . Abdominal pain, left lower quadrant 03/07/2017  . Nausea and vomiting in adult 03/07/2017  . Rectal bleeding 03/07/2017  . Sciatica of left side 12/12/2013  . Osteoporosis 06/28/2011   Teena Irani, PTA/CLT [phone]  Teena Irani 06/11/2018, 4:51 PM  [facility] [address] [place], [name], [address] Phone: [phone]   Fax:  [phone]  Name: Shelly Stein MRN: [id number] Date of Birth: 04-28-1946

## 2018-06-14 ENCOUNTER — Ambulatory Visit (HOSPITAL_COMMUNITY)
Admission: RE | Admit: 2018-06-14 | Discharge: 2018-06-14 | Disposition: A | Discharge status: Home / Self Care | Payer: Medicare Other | Source: Ambulatory Visit | Attending: Family Medicine | Admitting: Family Medicine

## 2018-06-14 ENCOUNTER — Ambulatory Visit (HOSPITAL_COMMUNITY): Payer: Medicare Other

## 2018-06-14 ENCOUNTER — Encounter (HOSPITAL_COMMUNITY): Payer: Self-pay

## 2018-06-14 DIAGNOSIS — M542 Cervicalgia: Secondary | ICD-10-CM

## 2018-06-14 DIAGNOSIS — R293 Abnormal posture: Secondary | ICD-10-CM

## 2018-06-14 DIAGNOSIS — R29898 Other symptoms and signs involving the musculoskeletal system: Secondary | ICD-10-CM | POA: Diagnosis not present

## 2018-06-14 DIAGNOSIS — M85851 Other specified disorders of bone density and structure, right thigh: Secondary | ICD-10-CM | POA: Diagnosis not present

## 2018-06-14 DIAGNOSIS — M810 Age-related osteoporosis without current pathological fracture: Secondary | ICD-10-CM | POA: Insufficient documentation

## 2018-06-14 DIAGNOSIS — Z780 Asymptomatic menopausal state: Secondary | ICD-10-CM | POA: Diagnosis not present

## 2018-06-14 NOTE — Therapy (Signed)
[place], [name], [address] Phone: [phone]   Fax:  [phone]  Physical Therapy Treatment  Patient Details  Name: Shelly Stein MRN: [id number] Date of Birth: October 29, 1946 Referring Provider: Sallee Lange, MD   Encounter Date: 06/14/2018  PT End of Session - 06/14/18 1037    Visit Number  3    Number of Visits  9    Date for PT Re-Evaluation  07/04/18    Authorization Type  Medicare    Authorization Time Period  06/06/18 to 07/04/18    Authorization - Visit Number  3    Authorization - Number of Visits  10    PT Start Time  3151    PT Stop Time  1115    PT Time Calculation (min)  40 min    Activity Tolerance  Patient tolerated treatment well;No increased pain    Behavior During Therapy  WFL for tasks assessed/performed       Past Medical History:  Diagnosis Date  . Back pain   . Diverticulosis    mild    Past Surgical History:  Procedure Laterality Date  . ABDOMINAL HYSTERECTOMY    . BREAST BIOPSY Left 06/02/2016  . FOOT SURGERY    . SHOULDER SURGERY      There were no vitals filed for this visit.  Subjective Assessment - 06/14/18 1032    Subjective  Pt stated she continues to have constant sharp pain, pain scale 5/10.  Reports increased difficulty rotating head to the Rt today.  Reports complaince wiht HEP daily without questions.    Patient Stated Goals  get some relief    Currently in Pain?  Yes    Pain Score  5     Pain Location  Neck    Pain Orientation  Right;Left    Pain Descriptors / Indicators  Sharp    Pain Type  Acute pain    Pain Onset  More than a month ago    Pain Frequency  Constant    Aggravating Factors   nothing    Pain Relieving Factors  ice    Effect of Pain on Daily Activities  severely impacts         [facility] PT Assessment - 06/14/18 0001      Assessment   Medical Diagnosis  Neck pain    Referring Provider  Sallee Lange, MD    Next MD Visit  about a month,  will call to set up f/u appointment Glenna Fellows, MD (neurosurgeon following pt) 07/09/18)                   [facility] Adult PT Treatment/Exercise - 06/14/18 0001      Neck Exercises: Seated   Neck Retraction  10 reps;3 secs    Other Seated Exercise  3D cervical excursion 5-10 reps 3-5" holds      Neck Exercises: Supine   Neck Retraction  5 reps;5 secs    Other Supine Exercise  Decompression exercises 1-5; 5 reps 5" holds      Manual Therapy   Manual Therapy  Soft tissue mobilization    Manual therapy comments  completed seperately from all other skilled itnerventions    Soft tissue mobilization  supine position to upper traps and levator scapula Rt>Lt to reduce tightness for pain control and mobility      Neck Exercises: Stretches   Upper Trapezius Stretch  Right;2 reps;20 seconds  PT Short Term Goals - 06/06/18 1627      PT SHORT TERM GOAL #1   Title  Pt will be independent with HEP and perform consistently in order to improve ROM and decrease pain.    Time  2    Period  Weeks    Status  New    Target Date  06/20/18      PT SHORT TERM GOAL #2   Title  Pt will have improved cervical AROM by 5 deg throughout in order to decrease pain and improve overall function.    Time  2    Period  Weeks    Status  New      PT SHORT TERM GOAL #3   Title  Pt will score 14/50 or < on the NDI in order to demo mild self-perceived disability due to her neck pain.    Time  2    Period  Weeks    Status  New        PT Long Term Goals - 06/06/18 1631      PT LONG TERM GOAL #1   Title  Pt will have improved cervical AROM by 10deg or > throughout in order to further decrease pain and maximzie her ability to drive with greater ease.    Time  4    Period  Weeks    Status  New    Target Date  07/04/18      PT LONG TERM GOAL #2   Title  Pt will report decreased neck pain to 3/10 or better intermittently in order to allow her to complete functional tasks with  greater ease and promote return to PLOF.    Time  4    Period  Weeks    Status  New      PT LONG TERM GOAL #3   Title  Pt will report being able to sleep through the night and awaken 2x or < during the night due to pain in order to maximize her recovery.    Time  4    Period  Weeks    Status  New      PT LONG TERM GOAL #4   Title  Pt will have decreased cervical and periscapular musculature restrictions to moderate in order to maximize her ROM and decrease her pain.    Time  4    Period  Weeks    Status  New      PT LONG TERM GOAL #5   Title  Pt will be able to perform the Deep Neck Flexor Endurace Test for 30 sec with proper form and without pain in order to demo improved functional neck strength.    Time  4    Period  Weeks    Status  New            Plan - 06/14/18 1125    Clinical Impression Statement  Pt arrived with reports of constant sharp pain in neck Bil and decreased mobility to Rt rotation.  Session focus on cervical mobility and postural strengthening.  Added decompression exercises for postural strengthening with osteoporosis.  Pt able to demonstrate appropriate form and mechanics with minimal cueing required.  EOS with manual soft tissue mobilization to address tightness and multiple spasms Bil UT and cervical region.  Pt reports pain reduced to 4/10, mainly soreness and improved ROM following manual.      Rehab Potential  Fair    PT Frequency  2x / week  PT Duration  4 weeks    PT Treatment/Interventions  ADLs/Self Care Home Management;Biofeedback;Cryotherapy;Electrical Stimulation;Moist Heat;Ultrasound;DME Instruction;Functional mobility training;Therapeutic activities;Balance training;Neuromuscular re-education;Cognitive remediation;Patient/family education;Manual techniques;Scar mobilization;Passive range of motion;Dry needling;Energy conservation;Taping    PT Next Visit Plan  Next session begin compression hose with RTB.  Continue to work on Research scientist (life sciences) and  postural education.  Manual for soft tissue restricitons, potentially dry needling for upper trap and levator scap (caution with paraspinals due to osteoporosis)    PT Home Exercise Plan  eval: supine cervical retractions; 06/14/18: decompression exercise 1-5       Patient will benefit from skilled therapeutic intervention in order to improve the following deficits and impairments:  Decreased mobility, Decreased range of motion, Decreased strength, Hypomobility, Increased fascial restricitons, Increased muscle spasms, Impaired flexibility, Improper body mechanics, Postural dysfunction, Pain  Visit Diagnosis: Abnormal posture  Cervicalgia  Other symptoms and signs involving the musculoskeletal system     Problem List Patient Active Problem List   Diagnosis Date Noted  . Abdominal pain, left lower quadrant 03/07/2017  . Nausea and vomiting in adult 03/07/2017  . Rectal bleeding 03/07/2017  . Sciatica of left side 12/12/2013  . Osteoporosis 06/28/2011   Ihor Austin, [place]; [place]  Aldona Lento 06/14/2018, 11:33 AM  [facility] [address] [place], [name], [address] Phone: [phone]   Fax:  [phone]  Name: Shelly Stein MRN: [id number] Date of Birth: 11-26-1946

## 2018-06-18 ENCOUNTER — Encounter (HOSPITAL_COMMUNITY): Payer: Self-pay

## 2018-06-18 ENCOUNTER — Ambulatory Visit (HOSPITAL_COMMUNITY): Payer: Medicare Other

## 2018-06-18 DIAGNOSIS — R293 Abnormal posture: Secondary | ICD-10-CM

## 2018-06-18 DIAGNOSIS — M542 Cervicalgia: Secondary | ICD-10-CM | POA: Diagnosis not present

## 2018-06-18 DIAGNOSIS — R29898 Other symptoms and signs involving the musculoskeletal system: Secondary | ICD-10-CM | POA: Diagnosis not present

## 2018-06-18 NOTE — Therapy (Signed)
[place], [name], [address] Phone: [phone]   Fax:  [phone]  Physical Therapy Treatment  Patient Details  Name: Shelly Stein MRN: [id number] Date of Birth: [birth year] Referring Provider: Sallee Lange, MD   Encounter Date: 06/18/2018  PT End of Session - 06/18/18 1512    Visit Number  4    Number of Visits  9    Date for PT Re-Evaluation  07/04/18    Authorization Type  Medicare    Authorization Time Period  06/06/18 to 07/04/18    Authorization - Visit Number  4    Authorization - Number of Visits  10    PT Start Time  3818    PT Stop Time  1515    PT Time Calculation (min)  40 min    Activity Tolerance  Patient tolerated treatment well;No increased pain    Behavior During Therapy  WFL for tasks assessed/performed       Past Medical History:  Diagnosis Date  . Back pain   . Diverticulosis    mild    Past Surgical History:  Procedure Laterality Date  . ABDOMINAL HYSTERECTOMY    . BREAST BIOPSY Left 06/02/2016  . FOOT SURGERY    . SHOULDER SURGERY      There were no vitals filed for this visit.  Subjective Assessment - 06/18/18 1437    Subjective  Pt  reports no significant improvement in pain since last session. She continues to have 5/10 pain all the time. Pt is consistent with exercises and reports no increase in pain after performance. She is frustrated with inability to get relief.     Currently in Pain?  Yes    Pain Score  5     Pain Location  Neck lateral neck (scalenes area) equal bilat    Pain Orientation  Right    Pain Descriptors / Indicators  Sharp    Pain Type  Acute pain                       [facility] Adult PT Treatment/Exercise - 06/18/18 0001      Neck Exercises: Seated   W Back  10 reps seated    Shoulder Flexion  Both;15 reps standing, chin tucked      Neck Exercises: Supine   Neck Retraction  10 reps;3 secs 2x10, on 2 piloows, VC for chin tuck     Other Supine Exercise  towel roll, wand flexion 2x10x3sec, then 20x1sec    Other Supine Exercise  thoracic towel roll stretch  3 minutes isolated, then combined with other activity      Manual Therapy   Manual Therapy  Joint mobilization;Passive ROM    Joint Mobilization  Captial flexion mobilization: 3x30sec Grade IV  in supine    Passive ROM  capital rotation stretch 2x30sec bilat , pec minor stretch 2x30sec bilat obtaining subjective stretch > pain               PT Short Term Goals - 06/06/18 1627      PT SHORT TERM GOAL #1   Title  Pt will be independent with HEP and perform consistently in order to improve ROM and decrease pain.    Time  2    Period  Weeks    Status  New    Target Date  06/20/18      PT SHORT TERM GOAL #2   Title  Pt  will have improved cervical AROM by 5 deg throughout in order to decrease pain and improve overall function.    Time  2    Period  Weeks    Status  New      PT SHORT TERM GOAL #3   Title  Pt will score 14/50 or < on the NDI in order to demo mild self-perceived disability due to her neck pain.    Time  2    Period  Weeks    Status  New        PT Long Term Goals - 06/06/18 1631      PT LONG TERM GOAL #1   Title  Pt will have improved cervical AROM by 10deg or > throughout in order to further decrease pain and maximzie her ability to drive with greater ease.    Time  4    Period  Weeks    Status  New    Target Date  07/04/18      PT LONG TERM GOAL #2   Title  Pt will report decreased neck pain to 3/10 or better intermittently in order to allow her to complete functional tasks with greater ease and promote return to PLOF.    Time  4    Period  Weeks    Status  New      PT LONG TERM GOAL #3   Title  Pt will report being able to sleep through the night and awaken 2x or < during the night due to pain in order to maximize her recovery.    Time  4    Period  Weeks    Status  New      PT LONG TERM GOAL #4   Title  Pt will have  decreased cervical and periscapular musculature restrictions to moderate in order to maximize her ROM and decrease her pain.    Time  4    Period  Weeks    Status  New      PT LONG TERM GOAL #5   Title  Pt will be able to perform the Deep Neck Flexor Endurace Test for 30 sec with proper form and without pain in order to demo improved functional neck strength.    Time  4    Period  Weeks    Status  New            Plan - 06/18/18 1513    Clinical Impression Statement  Pt with continued pain throughout. Session focus on capital mobility in sagittal and transverse plane. Also focusing on throacic mobility adn thoracooscapular strengthening. PT follows instructions very well for postural education. Pt ptogressing toward goals overall, but still with spastic scalenes bilat.     Rehab Potential  Fair    PT Frequency  2x / week    PT Duration  4 weeks    PT Treatment/Interventions  ADLs/Self Care Home Management;Biofeedback;Cryotherapy;Electrical Stimulation;Moist Heat;Ultrasound;DME Instruction;Functional mobility training;Therapeutic activities;Balance training;Neuromuscular re-education;Cognitive remediation;Patient/family education;Manual techniques;Scar mobilization;Passive range of motion;Dry needling;Energy conservation;Taping    PT Next Visit Plan    Continue to work on strengthenig and postural education.  Manual for soft tissue restricitons, potentially dry needling for upper trap and levator scap (caution with paraspinals due to osteoporosis)    PT Home Exercise Plan  eval: supine cervical retractions; 06/14/18: decompression exercise 1-5    Consulted and Agree with Plan of Care  Patient       Patient will benefit from skilled therapeutic intervention in order  to improve the following deficits and impairments:  Decreased mobility, Decreased range of motion, Decreased strength, Hypomobility, Increased fascial restricitons, Increased muscle spasms, Impaired flexibility, Improper body  mechanics, Postural dysfunction, Pain  Visit Diagnosis: Abnormal posture  Cervicalgia  Other symptoms and signs involving the musculoskeletal system     Problem List Patient Active Problem List   Diagnosis Date Noted  . Abdominal pain, left lower quadrant 03/07/2017  . Nausea and vomiting in adult 03/07/2017  . Rectal bleeding 03/07/2017  . Sciatica of left side 12/12/2013  . Osteoporosis 06/28/2011   3:16 PM, 06/18/18 Etta Grandchild, PT, DPT Physical Therapist at [facility] [phone] (office)     Etta Grandchild 06/18/2018, 3:16 PM  [facility] [address] [place], [name], [address] Phone: [phone]   Fax:  [phone]  Name: Shelly Stein MRN: [id number] Date of Birth: 06-28-1946

## 2018-06-20 ENCOUNTER — Encounter (HOSPITAL_COMMUNITY): Payer: Self-pay

## 2018-06-20 ENCOUNTER — Ambulatory Visit (HOSPITAL_COMMUNITY): Payer: Medicare Other

## 2018-06-20 DIAGNOSIS — R29898 Other symptoms and signs involving the musculoskeletal system: Secondary | ICD-10-CM

## 2018-06-20 DIAGNOSIS — R293 Abnormal posture: Secondary | ICD-10-CM

## 2018-06-20 DIAGNOSIS — M542 Cervicalgia: Secondary | ICD-10-CM

## 2018-06-20 NOTE — Therapy (Signed)
[place], [name], [address] Phone: [phone]   Fax:  [phone]  Physical Therapy Treatment  Patient Details  Name: Shelly Stein [place] MRN: [id number] Date of Birth: [birth year] Referring Provider: Sallee Lange, MD   Encounter Date: 06/20/2018  PT End of Session - 06/20/18 1704    Visit Number  5    Number of Visits  9    Date for PT Re-Evaluation  07/04/18    Authorization Type  Medicare    Authorization Time Period  06/06/18 to 07/04/18    Authorization - Visit Number  5    Authorization - Number of Visits  10    PT Start Time  4650    PT Stop Time  3546    PT Time Calculation (min)  38 min    Activity Tolerance  Patient tolerated treatment well;No increased pain    Behavior During Therapy  WFL for tasks assessed/performed       Past Medical History:  Diagnosis Date  . Back pain   . Diverticulosis    mild    Past Surgical History:  Procedure Laterality Date  . ABDOMINAL HYSTERECTOMY    . BREAST BIOPSY Left 06/02/2016  . FOOT SURGERY    . SHOULDER SURGERY      There were no vitals filed for this visit.  Subjective Assessment - 06/20/18 1657    Subjective  Pt reports increased pain following last session, reports inability to get any sleep that night.  Pain scale 8/10 cervical musculature.  Reports increased stiffness due to pain.      Patient Stated Goals  get some relief    Currently in Pain?  Yes    Pain Score  8     Pain Location  Neck    Pain Orientation  Right;Left    Pain Descriptors / Indicators  Sharp;Aching    Pain Type  Acute pain    Pain Onset  More than a month ago    Pain Frequency  Constant    Aggravating Factors   nothing    Pain Relieving Factors  ice    Effect of Pain on Daily Activities  severly impacts                       [facility] Adult PT Treatment/Exercise - 06/20/18 0001      Neck Exercises: Seated   Other Seated Exercise  3D cervical excursion 5-10  reps 3-5" holds      Neck Exercises: Supine   Neck Retraction  10 reps;3 secs VC for proper form 3"; 2 sets    Other Supine Exercise  decompression1-4 with RTB 5x each      Manual Therapy   Manual Therapy  Soft tissue mobilization    Manual therapy comments  completed seperately from all other skilled itnerventions    Soft tissue mobilization  supine position to upper traps, scalenes and levator scapula Rt>Lt to reduce tightness for pain control and mobility               PT Short Term Goals - 06/06/18 1627      PT SHORT TERM GOAL #1   Title  Pt will be independent with HEP and perform consistently in order to improve ROM and decrease pain.    Time  2    Period  Weeks    Status  New    Target Date  06/20/18  PT SHORT TERM GOAL #2   Title  Pt will have improved cervical AROM by 5 deg throughout in order to decrease pain and improve overall function.    Time  2    Period  Weeks    Status  New      PT SHORT TERM GOAL #3   Title  Pt will score 14/50 or < on the NDI in order to demo mild self-perceived disability due to her neck pain.    Time  2    Period  Weeks    Status  New        PT Long Term Goals - 06/06/18 1631      PT LONG TERM GOAL #1   Title  Pt will have improved cervical AROM by 10deg or > throughout in order to further decrease pain and maximzie her ability to drive with greater ease.    Time  4    Period  Weeks    Status  New    Target Date  07/04/18      PT LONG TERM GOAL #2   Title  Pt will report decreased neck pain to 3/10 or better intermittently in order to allow her to complete functional tasks with greater ease and promote return to PLOF.    Time  4    Period  Weeks    Status  New      PT LONG TERM GOAL #3   Title  Pt will report being able to sleep through the night and awaken 2x or < during the night due to pain in order to maximize her recovery.    Time  4    Period  Weeks    Status  New      PT LONG TERM GOAL #4   Title  Pt  will have decreased cervical and periscapular musculature restrictions to moderate in order to maximize her ROM and decrease her pain.    Time  4    Period  Weeks    Status  New      PT LONG TERM GOAL #5   Title  Pt will be able to perform the Deep Neck Flexor Endurace Test for 30 sec with proper form and without pain in order to demo improved functional neck strength.    Time  4    Period  Weeks    Status  New            Plan - 06/20/18 1731    Clinical Impression Statement  Pt limited by high pain scale this session.  Session foucs on cervical mobility and pain free postural strengthening.  Progressed to decompression exercises wiht theraband resistance.  Increased time wiht manual soft tissue mobilization to address restrictions Bil upper traps and scalenes.  Pt stated at EOS pain reduced to 4-5/10 and improved mobility and tolerance for movements.      Rehab Potential  Fair    PT Frequency  2x / week    PT Duration  4 weeks    PT Treatment/Interventions  ADLs/Self Care Home Management;Biofeedback;Cryotherapy;Electrical Stimulation;Moist Heat;Ultrasound;DME Instruction;Functional mobility training;Therapeutic activities;Balance training;Neuromuscular re-education;Cognitive remediation;Patient/family education;Manual techniques;Scar mobilization;Passive range of motion;Dry needling;Energy conservation;Taping    PT Next Visit Plan    Continue to work on strengthenig and postural education.  Manual for soft tissue restricitons, potentially dry needling for upper trap and levator scap (caution with paraspinals due to osteoporosis)    PT Home Exercise Plan  eval: supine cervical retractions; 06/14/18: decompression exercise  1-5       Patient will benefit from skilled therapeutic intervention in order to improve the following deficits and impairments:  Decreased mobility, Decreased range of motion, Decreased strength, Hypomobility, Increased fascial restricitons, Increased muscle spasms,  Impaired flexibility, Improper body mechanics, Postural dysfunction, Pain  Visit Diagnosis: Abnormal posture  Cervicalgia  Other symptoms and signs involving the musculoskeletal system     Problem List Patient Active Problem List   Diagnosis Date Noted  . Abdominal pain, left lower quadrant 03/07/2017  . Nausea and vomiting in adult 03/07/2017  . Rectal bleeding 03/07/2017  . Sciatica of left side 12/12/2013  . Osteoporosis 06/28/2011   Ihor Austin, [place]; [place]  Aldona Lento 06/20/2018, 6:23 PM  [facility] [address] [place], [name], [address] Phone: [phone]   Fax:  [phone]  Name: KENNI NEWTON MRN: [id number] Date of Birth: March 02, 1946

## 2018-06-25 ENCOUNTER — Encounter (HOSPITAL_COMMUNITY): Payer: Self-pay

## 2018-06-25 ENCOUNTER — Ambulatory Visit (HOSPITAL_COMMUNITY): Payer: Medicare Other

## 2018-06-25 ENCOUNTER — Telehealth (HOSPITAL_COMMUNITY): Payer: Self-pay

## 2018-06-25 ENCOUNTER — Telehealth (HOSPITAL_COMMUNITY): Payer: Self-pay | Admitting: Family Medicine

## 2018-06-25 DIAGNOSIS — R29898 Other symptoms and signs involving the musculoskeletal system: Secondary | ICD-10-CM | POA: Diagnosis not present

## 2018-06-25 DIAGNOSIS — M542 Cervicalgia: Secondary | ICD-10-CM | POA: Diagnosis not present

## 2018-06-25 DIAGNOSIS — R293 Abnormal posture: Secondary | ICD-10-CM

## 2018-06-25 NOTE — Telephone Encounter (Signed)
7/16  9:30 I called to see if she could come in at 4 and she said that she couldn't.

## 2018-06-25 NOTE — Telephone Encounter (Signed)
Pt requested to cx Thursday apptment she is the only one at work at it's just too much. NF 06/25/18

## 2018-06-25 NOTE — Therapy (Signed)
[place], [name], [address] Phone: [phone]   Fax:  [phone]  Physical Therapy Treatment  Patient Details  Name: Shelly Stein MRN: [id number] Date of Birth: [birth year] Referring Provider: Sallee Lange, MD   Encounter Date: 06/25/2018  PT End of Session - 06/25/18 1655    Visit Number  6    Number of Visits  9    Date for PT Re-Evaluation  07/04/18    Authorization Type  Medicare    Authorization Time Period  06/06/18 to 07/04/18    Authorization - Visit Number  6    Authorization - Number of Visits  10    PT Start Time  0109    PT Stop Time  3235    PT Time Calculation (min)  43 min    Activity Tolerance  Patient tolerated treatment well;No increased pain Pain reduced to 3-4/10 from 5/10 at EOS    Behavior During Therapy  [facility] for tasks assessed/performed       Past Medical History:  Diagnosis Date  . Back pain   . Diverticulosis    mild    Past Surgical History:  Procedure Laterality Date  . ABDOMINAL HYSTERECTOMY    . BREAST BIOPSY Left 06/02/2016  . FOOT SURGERY    . SHOULDER SURGERY      There were no vitals filed for this visit.  Subjective Assessment - 06/25/18 1648    Subjective  Pt reports she has improved abilites to turn head to Rt, helps with driving, current pain scale 4-5/10.    Patient Stated Goals  get some relief    Currently in Pain?  Yes    Pain Score  5     Pain Location  Neck    Pain Orientation  Right;Left    Pain Descriptors / Indicators  Sore;Tightness    Pain Type  Acute pain    Pain Onset  More than a month ago    Pain Frequency  Constant    Aggravating Factors   nothing    Pain Relieving Factors  ice    Effect of Pain on Daily Activities  severly impacts                       [facility] Adult PT Treatment/Exercise - 06/25/18 0001      Neck Exercises: Standing   Neck Retraction  5 reps;5 secs;Limitations    Neck Retraction Limitations  verbal  cueing and tactile cueing with towel    Upper Extremity Flexion with Stabilization  Flexion;10 reps;Limitations    UE Flexion with Stabilization Limitations  with retraction      Neck Exercises: Seated   Other Seated Exercise  3D cervical excursion 5 reps 3-5" holds      Neck Exercises: Supine   Other Supine Exercise  decompression1-4 with RTB 10x each      Manual Therapy   Manual Therapy  Soft tissue mobilization    Manual therapy comments  completed seperately from all other skilled itnerventions    Soft tissue mobilization  supine position to upper traps, scalenes and levator scapula Rt>Lt to reduce tightness for pain control and mobility               PT Short Term Goals - 06/06/18 1627      PT SHORT TERM GOAL #1   Title  Pt will be independent with HEP and perform consistently in order to improve ROM  and decrease pain.    Time  2    Period  Weeks    Status  New    Target Date  06/20/18      PT SHORT TERM GOAL #2   Title  Pt will have improved cervical AROM by 5 deg throughout in order to decrease pain and improve overall function.    Time  2    Period  Weeks    Status  New      PT SHORT TERM GOAL #3   Title  Pt will score 14/50 or < on the NDI in order to demo mild self-perceived disability due to her neck pain.    Time  2    Period  Weeks    Status  New        PT Long Term Goals - 06/06/18 1631      PT LONG TERM GOAL #1   Title  Pt will have improved cervical AROM by 10deg or > throughout in order to further decrease pain and maximzie her ability to drive with greater ease.    Time  4    Period  Weeks    Status  New    Target Date  07/04/18      PT LONG TERM GOAL #2   Title  Pt will report decreased neck pain to 3/10 or better intermittently in order to allow her to complete functional tasks with greater ease and promote return to PLOF.    Time  4    Period  Weeks    Status  New      PT LONG TERM GOAL #3   Title  Pt will report being able to  sleep through the night and awaken 2x or < during the night due to pain in order to maximize her recovery.    Time  4    Period  Weeks    Status  New      PT LONG TERM GOAL #4   Title  Pt will have decreased cervical and periscapular musculature restrictions to moderate in order to maximize her ROM and decrease her pain.    Time  4    Period  Weeks    Status  New      PT LONG TERM GOAL #5   Title  Pt will be able to perform the Deep Neck Flexor Endurace Test for 30 sec with proper form and without pain in order to demo improved functional neck strength.    Time  4    Period  Weeks    Status  New            Plan - 06/25/18 1656    Clinical Impression Statement  Pt reports vast improvements in pain control and mobiltiy.  Continued session foucs with cervical mobility and postural strengtheing.  Continued with theraband resistance decompression exercises with less cueing for form this session compared to last, pt given RTB and printout to progress decompression at home.  Pt able to complete all exercises with no reports of pain.  EOS with manual soft tissue mobilizaiotn to address restrictions in upper traps and scalenes.      History and Personal Factors relevant to plan of care:  has osteoporosis (no traction and would highly recommend not performing joint mobs)    Rehab Potential  Fair    PT Frequency  2x / week    PT Duration  4 weeks    PT Treatment/Interventions  ADLs/Self Care Home  Management;Biofeedback;Cryotherapy;Electrical Stimulation;Moist Heat;Ultrasound;DME Instruction;Functional mobility training;Therapeutic activities;Balance training;Neuromuscular re-education;Cognitive remediation;Patient/family education;Manual techniques;Scar mobilization;Passive range of motion;Dry needling;Energy conservation;Taping    PT Next Visit Plan  Add gentle UT and scalenes stretches next session.  Progress to theraband postural strengthening and continue cervical mobilty.  Continue to work  on Research scientist (life sciences) and postural education.  Manual for soft tissue restricitons, potentially dry needling for upper trap and levator scap (caution with paraspinals due to osteoporosis)    PT Home Exercise Plan  eval: supine cervical retractions; 06/14/18: decompression exercise 1-5; 7/15: theraband decompression 1-4       Patient will benefit from skilled therapeutic intervention in order to improve the following deficits and impairments:  Decreased mobility, Decreased range of motion, Decreased strength, Hypomobility, Increased fascial restricitons, Increased muscle spasms, Impaired flexibility, Improper body mechanics, Postural dysfunction, Pain  Visit Diagnosis: Abnormal posture  Cervicalgia  Other symptoms and signs involving the musculoskeletal system     Problem List Patient Active Problem List   Diagnosis Date Noted  . Abdominal pain, left lower quadrant 03/07/2017  . Nausea and vomiting in adult 03/07/2017  . Rectal bleeding 03/07/2017  . Sciatica of left side 12/12/2013  . Osteoporosis 06/28/2011   Ihor Austin, [place]; [facility] [phone]'  Aldona Lento 06/25/2018, 5:45 PM  [facility] [address] [place], [name], [address] Phone: [phone]   Fax:  [phone]  Name: Shelly Stein MRN: [id number] Date of Birth: 1946-03-18

## 2018-06-27 ENCOUNTER — Encounter (HOSPITAL_COMMUNITY): Payer: Medicare Other

## 2018-07-01 ENCOUNTER — Other Ambulatory Visit: Payer: Self-pay | Admitting: Family Medicine

## 2018-07-02 ENCOUNTER — Ambulatory Visit (HOSPITAL_COMMUNITY): Payer: Medicare Other

## 2018-07-02 ENCOUNTER — Encounter (HOSPITAL_COMMUNITY): Payer: Self-pay

## 2018-07-02 DIAGNOSIS — M542 Cervicalgia: Secondary | ICD-10-CM

## 2018-07-02 DIAGNOSIS — R29898 Other symptoms and signs involving the musculoskeletal system: Secondary | ICD-10-CM

## 2018-07-02 DIAGNOSIS — R293 Abnormal posture: Secondary | ICD-10-CM

## 2018-07-02 NOTE — Therapy (Signed)
[place], [name], [address] Phone: [phone]   Fax:  [phone]  Physical Therapy Treatment  Patient Details  Name: Shelly Stein MRN: [id number] Date of Birth: November 01, 1946 Referring Provider: Sallee Lange, MD   Encounter Date: 07/02/2018  PT End of Session - 07/02/18 1522    Visit Number  7    Number of Visits  9    Date for PT Re-Evaluation  07/04/18    Authorization Type  Medicare    Authorization Time Period  06/06/18 to 07/04/18    Authorization - Visit Number  7    Authorization - Number of Visits  10    PT Start Time  7341    PT Stop Time  1600    PT Time Calculation (min)  46 min    Activity Tolerance  Patient tolerated treatment well;No increased pain    Behavior During Therapy  WFL for tasks assessed/performed       Past Medical History:  Diagnosis Date  . Back pain   . Diverticulosis    mild    Past Surgical History:  Procedure Laterality Date  . ABDOMINAL HYSTERECTOMY    . BREAST BIOPSY Left 06/02/2016  . FOOT SURGERY    . SHOULDER SURGERY      There were no vitals filed for this visit.  Subjective Assessment - 07/02/18 1518    Subjective  Pt stated she felt really good over weekend, has been compliant with new HEP exercises maybe every other day.  Current pain scale 4/10.    Patient Stated Goals  get some relief    Currently in Pain?  Yes    Pain Score  4     Pain Location  Neck    Pain Orientation  Right;Left Lt > Rt    Pain Descriptors / Indicators  Sore;Aching    Pain Type  Acute pain    Pain Onset  More than a month ago    Pain Frequency  Constant    Aggravating Factors   nothing    Pain Relieving Factors  ice    Effect of Pain on Daily Activities  severly impacts                       [facility] Adult PT Treatment/Exercise - 07/02/18 0001      Neck Exercises: Theraband   Shoulder Extension  10 reps;Red    Rows  10 reps;Red      Neck Exercises: Standing    Neck Retraction  5 reps;5 secs    Neck Retraction Limitations  verbal cueing and tactile cueing with towel    Upper Extremity Flexion with Stabilization  Flexion;10 reps;Limitations    UE Flexion with Stabilization Limitations  with retraction      Neck Exercises: Supine   Other Supine Exercise  decompression1-4 with RTB 10x each (able to recall and demonstrate good form with HEP)      Manual Therapy   Manual Therapy  Soft tissue mobilization    Manual therapy comments  completed seperately from all other skilled itnerventions    Soft tissue mobilization  supine position to upper traps, scalenes and levator scapula Rt>Lt to reduce tightness for pain control and mobility      Neck Exercises: Stretches   Upper Trapezius Stretch  Right;Left;2 reps;30 seconds    Levator Stretch  2 reps;30 seconds;Right;Left  PT Short Term Goals - 06/06/18 1627      PT SHORT TERM GOAL #1   Title  Pt will be independent with HEP and perform consistently in order to improve ROM and decrease pain.    Time  2    Period  Weeks    Status  New    Target Date  06/20/18      PT SHORT TERM GOAL #2   Title  Pt will have improved cervical AROM by 5 deg throughout in order to decrease pain and improve overall function.    Time  2    Period  Weeks    Status  New      PT SHORT TERM GOAL #3   Title  Pt will score 14/50 or < on the NDI in order to demo mild self-perceived disability due to her neck pain.    Time  2    Period  Weeks    Status  New        PT Long Term Goals - 06/06/18 1631      PT LONG TERM GOAL #1   Title  Pt will have improved cervical AROM by 10deg or > throughout in order to further decrease pain and maximzie her ability to drive with greater ease.    Time  4    Period  Weeks    Status  New    Target Date  07/04/18      PT LONG TERM GOAL #2   Title  Pt will report decreased neck pain to 3/10 or better intermittently in order to allow her to complete functional  tasks with greater ease and promote return to PLOF.    Time  4    Period  Weeks    Status  New      PT LONG TERM GOAL #3   Title  Pt will report being able to sleep through the night and awaken 2x or < during the night due to pain in order to maximize her recovery.    Time  4    Period  Weeks    Status  New      PT LONG TERM GOAL #4   Title  Pt will have decreased cervical and periscapular musculature restrictions to moderate in order to maximize her ROM and decrease her pain.    Time  4    Period  Weeks    Status  New      PT LONG TERM GOAL #5   Title  Pt will be able to perform the Deep Neck Flexor Endurace Test for 30 sec with proper form and without pain in order to demo improved functional neck strength.    Time  4    Period  Weeks    Status  New            Plan - 07/02/18 1605    Clinical Impression Statement  Pt progressing well towards goals with reports of pain reduced and compliance with HEP.  Reviewed form wiht decompression exercises wiht ability to verbalize and demonstrate good form with minimal cueing required.  Added stretches this session to address cervical mobility as well as progressed to theraband for postural strenghtening.  EOS with manual soft tissue mobilization to address restricitons in upper traps Lt> Rt, scalenes and levator.  Reports pain reduce to 2-3/10 at EOS.      History and Personal Factors relevant to plan of care:  has osteoporosis (no traction and would highly  recommend not performing joint mobs)    Rehab Potential  Fair    PT Frequency  2x / week    PT Duration  4 weeks    PT Treatment/Interventions  ADLs/Self Care Home Management;Biofeedback;Cryotherapy;Electrical Stimulation;Moist Heat;Ultrasound;DME Instruction;Functional mobility training;Therapeutic activities;Balance training;Neuromuscular re-education;Cognitive remediation;Patient/family education;Manual techniques;Scar mobilization;Passive range of motion;Dry needling;Energy  conservation;Taping    PT Next Visit Plan  Reassess next session.  Address proper computer mechanics with work and deep flexor endurance training.  Manual for soft tissue restrictions for upper trap, levator scap and scalenes (caution wiht paraspinals due to osteoporosis)    PT Home Exercise Plan  eval: supine cervical retractions; 06/14/18: decompression exercise 1-5; 7/15: theraband decompression 1-4       Patient will benefit from skilled therapeutic intervention in order to improve the following deficits and impairments:  Decreased mobility, Decreased range of motion, Decreased strength, Hypomobility, Increased fascial restricitons, Increased muscle spasms, Impaired flexibility, Improper body mechanics, Postural dysfunction, Pain  Visit Diagnosis: Abnormal posture  Cervicalgia  Other symptoms and signs involving the musculoskeletal system     Problem List Patient Active Problem List   Diagnosis Date Noted  . Abdominal pain, left lower quadrant 03/07/2017  . Nausea and vomiting in adult 03/07/2017  . Rectal bleeding 03/07/2017  . Sciatica of left side 12/12/2013  . Osteoporosis 06/28/2011   Ihor Austin, [place]; [place]  Aldona Lento 07/02/2018, 4:19 PM  [facility] [address] [place], [name], [address] Phone: [phone]   Fax:  [phone]  Name: DONYELLE ENYEART MRN: [id number] Date of Birth: 09/09/1946

## 2018-07-04 ENCOUNTER — Ambulatory Visit (HOSPITAL_COMMUNITY): Payer: Medicare Other

## 2018-07-04 ENCOUNTER — Encounter (HOSPITAL_COMMUNITY): Payer: Self-pay

## 2018-07-04 DIAGNOSIS — R29898 Other symptoms and signs involving the musculoskeletal system: Secondary | ICD-10-CM | POA: Diagnosis not present

## 2018-07-04 DIAGNOSIS — R293 Abnormal posture: Secondary | ICD-10-CM

## 2018-07-04 DIAGNOSIS — M542 Cervicalgia: Secondary | ICD-10-CM | POA: Diagnosis not present

## 2018-07-04 NOTE — Therapy (Signed)
[place], [name], [address] Phone: [phone]   Fax:  [phone]   Progress Note Reporting Period 06/06/18 to 07/04/18  See note below for Objective Data and Assessment of Progress/Goals.   Physical Therapy Treatment  Patient Details  Name: Shelly Stein MRN: [id number] Date of Birth: 02-Feb-1946 Referring Provider: Sallee Lange, MD   Encounter Date: 07/04/2018  PT End of Session - 07/04/18 1517    Visit Number  8    Number of Visits  17    Date for PT Re-Evaluation  08/01/18    Authorization Type  Medicare    Authorization Time Period  06/06/18 to 07/04/18; NEW: 07/04/18 to 08/01/18    Authorization - Visit Number  8    Authorization - Number of Visits  17    PT Start Time  6811    PT Stop Time  1600    PT Time Calculation (min)  43 min    Activity Tolerance  Patient tolerated treatment well;No increased pain    Behavior During Therapy  WFL for tasks assessed/performed       Past Medical History:  Diagnosis Date  . Back pain   . Diverticulosis    mild    Past Surgical History:  Procedure Laterality Date  . ABDOMINAL HYSTERECTOMY    . BREAST BIOPSY Left 06/02/2016  . FOOT SURGERY    . SHOULDER SURGERY      There were no vitals filed for this visit.  Subjective Assessment - 07/04/18 1517    Subjective  Pt states that her R neck pain has gotten much better; she still has pain but she can turn easier without it grabbing. The left side of her neck is still tight, but again, not as tight.     Patient Stated Goals  get some relief    Currently in Pain?  Yes    Pain Score  4     Pain Location  Neck    Pain Orientation  Left;Right    Pain Descriptors / Indicators  Sharp;Aching    Pain Type  Acute pain    Pain Onset  More than a month ago    Pain Frequency  Constant    Aggravating Factors   nothing    Pain Relieving Factors  ice    Effect of Pain on Daily Activities  severly impacts          [facility] PT Assessment - 07/04/18 0001      Assessment   Medical Diagnosis  Neck pain    Referring Provider  Sallee Lange, MD    Next MD Visit  after f/u appointment with Dr. Lahoma Crocker, MD (neurosurgeon following pt) 07/09/18)      Observation/Other Assessments   Focus on Therapeutic Outcomes (FOTO)   47% limitation was 52% limitation    Other Surveys   Other Surveys    Neck Disability Index   15/50 was 23/50      Other:   Other/ Comments  DNFE test: 17.8sec  was 15sec      AROM   Cervical Flexion  53 was 53    Cervical Extension  47 was 43    Cervical - Right Side Bend  21 was 10    Cervical - Left Side Bend  25 was 18    Cervical - Right Rotation  58 was 47    Cervical - Left Rotation  64 was 54  Palpation   Palpation comment  mod to max restrictions of bil upper trap, levator scap, cervical and thoracic paraspinals, and SCM; all recreatd her same neck pain and tender to palpation; but much improved from initial evaluation            [facility] Adult PT Treatment/Exercise - 07/04/18 0001      Manual Therapy   Manual Therapy  Soft tissue mobilization    Manual therapy comments  completed seperately from all other skilled itnerventions    Soft tissue mobilization  prone position, upper trap, levator scap, SCM, L>R             PT Short Term Goals - 07/04/18 1520      PT SHORT TERM GOAL #1   Title  Pt will be independent with HEP and perform consistently in order to improve ROM and decrease pain.    Time  2    Period  Weeks    Status  Achieved      PT SHORT TERM GOAL #2   Title  Pt will have improved cervical AROM by 5 deg throughout in order to decrease pain and improve overall function.    Baseline  7/25: see AROM    Time  2    Period  Weeks    Status  Partially Met      PT SHORT TERM GOAL #3   Title  Pt will score 14/50 or < on the NDI in order to demo mild self-perceived disability due to her neck pain.     Baseline  7/25: 15/50    Time  2    Period  Weeks    Status  On-going        PT Long Term Goals - 07/04/18 1522      PT LONG TERM GOAL #1   Title  Pt will have improved cervical AROM by 10deg or > throughout in order to further decrease pain and maximzie her ability to drive with greater ease.    Baseline  7/25: see AROM    Time  4    Period  Weeks    Status  Partially Met      PT LONG TERM GOAL #2   Title  Pt will report decreased neck pain to 3/10 or better intermittently in order to allow her to complete functional tasks with greater ease and promote return to PLOF.    Baseline  7/:25: 3-4/10 on a daily basis average    Time  4    Period  Weeks    Status  Partially Met      PT LONG TERM GOAL #3   Title  Pt will report being able to sleep through the night and awaken 2x or < during the night due to pain in order to maximize her recovery.    Baseline  7/25: agrees with this statement    Time  4    Period  Weeks    Status  Achieved      PT LONG TERM GOAL #4   Title  Pt will have decreased cervical and periscapular musculature restrictions to moderate in order to maximize her ROM and decrease her pain.    Time  4    Period  Weeks    Status  On-going      PT LONG TERM GOAL #5   Title  Pt will be able to perform the Deep Neck Flexor Endurace Test for 30 sec with proper form and without pain  in order to demo improved functional neck strength.    Baseline  7/25: 17.8sec    Time  4    Period  Weeks    Status  On-going            Plan - 07/04/18 1613    Clinical Impression Statement  PT reassessed pt's goals and outcome measures this date. Pt has made great progress overall as illustrated above. Subjectively, pt states her pain has decreased from 12-15/10 prior to therapy to 3-4/10 now on a daily basis. Her ROM improved throughout almost all ranges, but most notably in her bil rotation. She scored 15/50 on the NDI which still indicates moderate self-perceived disability  due to her neck pain. PT feels pt would benefit from continued skilled PT intervention to continue to address soft tissue restrictions throughout cervical and periscapular musculature and PT recommending dry needling to upper trap, levator scap, and potentially SCM in order to continue to decrease her pain, decrease her soft tissue restrictions, and maximize her ROM.     Rehab Potential  Fair    PT Frequency  2x / week    PT Duration  4 weeks    PT Treatment/Interventions  ADLs/Self Care Home Management;Biofeedback;Cryotherapy;Electrical Stimulation;Moist Heat;Ultrasound;DME Instruction;Functional mobility training;Therapeutic activities;Balance training;Neuromuscular re-education;Cognitive remediation;Patient/family education;Manual techniques;Scar mobilization;Passive range of motion;Dry needling;Energy conservation;Taping    PT Next Visit Plan  Begin dry needling for upper trap, levator scap, and/or SCM; Address proper computer mechanics with work and deep flexor endurance training.  Manual for soft tissue restrictions for upper trap, levator scap and scalenes (caution wiht paraspinals due to osteoporosis)    PT Home Exercise Plan  eval: supine cervical retractions; 06/14/18: decompression exercise 1-5; 7/15: theraband decompression 1-4    Consulted and Agree with Plan of Care  Patient       Patient will benefit from skilled therapeutic intervention in order to improve the following deficits and impairments:  Decreased mobility, Decreased range of motion, Decreased strength, Hypomobility, Increased fascial restricitons, Increased muscle spasms, Impaired flexibility, Improper body mechanics, Postural dysfunction, Pain  Visit Diagnosis: Abnormal posture - Plan: PT plan of care cert/re-cert  Cervicalgia - Plan: PT plan of care cert/re-cert  Other symptoms and signs involving the musculoskeletal system - Plan: PT plan of care cert/re-cert     Problem List Patient Active Problem List   Diagnosis  Date Noted  . Abdominal pain, left lower quadrant 03/07/2017  . Nausea and vomiting in adult 03/07/2017  . Rectal bleeding 03/07/2017  . Sciatica of left side 12/12/2013  . Osteoporosis 06/28/2011        Geraldine Solar PT, DPT  [facility] [address] [place], [name], [address] Phone: [phone]   Fax:  [phone]  Name: MILO SOLANA MRN: [id number] Date of Birth: 1946-07-07

## 2018-07-10 ENCOUNTER — Encounter (HOSPITAL_COMMUNITY): Payer: Self-pay

## 2018-07-10 ENCOUNTER — Ambulatory Visit (HOSPITAL_COMMUNITY): Payer: Medicare Other

## 2018-07-10 DIAGNOSIS — R29898 Other symptoms and signs involving the musculoskeletal system: Secondary | ICD-10-CM

## 2018-07-10 DIAGNOSIS — M542 Cervicalgia: Secondary | ICD-10-CM | POA: Diagnosis not present

## 2018-07-10 DIAGNOSIS — R293 Abnormal posture: Secondary | ICD-10-CM | POA: Diagnosis not present

## 2018-07-10 NOTE — Therapy (Signed)
[place], [name], [address] Phone: [phone]   Fax:  [phone]  Physical Therapy Treatment  Patient Details  Name: FLORENTINA MARQUART MRN: [id number] Date of Birth: [birth year] Referring Provider: Sallee Lange, MD   Encounter Date: 07/10/2018  PT End of Session - 07/10/18 1519    Visit Number  9    Number of Visits  17    Date for PT Re-Evaluation  08/01/18    Authorization Type  Medicare    Authorization Time Period  06/06/18 to 07/04/18; NEW: 07/04/18 to 08/01/18    Authorization - Visit Number  9    Authorization - Number of Visits  17    PT Start Time  1275    PT Stop Time  1700    PT Time Calculation (min)  44 min    Activity Tolerance  Patient tolerated treatment well;No increased pain    Behavior During Therapy  WFL for tasks assessed/performed       Past Medical History:  Diagnosis Date  . Back pain   . Diverticulosis    mild    Past Surgical History:  Procedure Laterality Date  . ABDOMINAL HYSTERECTOMY    . BREAST BIOPSY Left 06/02/2016  . FOOT SURGERY    . SHOULDER SURGERY      There were no vitals filed for this visit.  Subjective Assessment - 07/10/18 1520    Subjective  Pt reports that she has a bad headache all day. The neck itself is feeling okay.     Patient Stated Goals  get some relief    Currently in Pain?  Yes    Pain Score  4     Pain Location  Neck    Pain Orientation  Left;Right    Pain Descriptors / Indicators  Sharp;Aching    Pain Type  Acute pain    Pain Onset  More than a month ago    Pain Frequency  Constant    Aggravating Factors   nothing    Pain Relieving Factors  ice    Effect of Pain on Daily Activities  severly impacts             [facility] Adult PT Treatment/Exercise - 07/10/18 0001      Manual Therapy   Manual Therapy  Soft tissue mobilization    Manual therapy comments  completed seperately from all other skilled itnerventions    Soft tissue  mobilization  prone position, upper trap, levator scap, suboccipitals, L>R following needling in order to decrease pain and restrictions      Neck Exercises: Stretches   Upper Trapezius Stretch  Left;2 reps;30 seconds    Levator Stretch  Left;2 reps;30 seconds       Trigger Point Dry Needling - 07/10/18 1613    Consent Given?  Yes    Education Handout Provided  Yes at last visit    Muscles Treated Upper Body  Upper trapezius;Levator scapulae    Upper Trapezius Response  Twitch reponse elicited;Palpable increased muscle length L in prone and supine    Levator Scapulae Response  Twitch response elicited;Palpable increased muscle length L in prone           PT Education - 07/10/18 1611    Education Details  expect soreness following trigger point dry needling, can apply ice/heat following for pain control    Person(s) Educated  Patient    Methods  Explanation;Demonstration    Comprehension  Verbalized understanding;Returned  demonstration       PT Short Term Goals - 07/04/18 1520      PT SHORT TERM GOAL #1   Title  Pt will be independent with HEP and perform consistently in order to improve ROM and decrease pain.    Time  2    Period  Weeks    Status  Achieved      PT SHORT TERM GOAL #2   Title  Pt will have improved cervical AROM by 5 deg throughout in order to decrease pain and improve overall function.    Baseline  7/25: see AROM    Time  2    Period  Weeks    Status  Partially Met      PT SHORT TERM GOAL #3   Title  Pt will score 14/50 or < on the NDI in order to demo mild self-perceived disability due to her neck pain.    Baseline  7/25: 15/50    Time  2    Period  Weeks    Status  On-going        PT Long Term Goals - 07/04/18 1522      PT LONG TERM GOAL #1   Title  Pt will have improved cervical AROM by 10deg or > throughout in order to further decrease pain and maximzie her ability to drive with greater ease.    Baseline  7/25: see AROM    Time  4     Period  Weeks    Status  Partially Met      PT LONG TERM GOAL #2   Title  Pt will report decreased neck pain to 3/10 or better intermittently in order to allow her to complete functional tasks with greater ease and promote return to PLOF.    Baseline  7/:25: 3-4/10 on a daily basis average    Time  4    Period  Weeks    Status  Partially Met      PT LONG TERM GOAL #3   Title  Pt will report being able to sleep through the night and awaken 2x or < during the night due to pain in order to maximize her recovery.    Baseline  7/25: agrees with this statement    Time  4    Period  Weeks    Status  Achieved      PT LONG TERM GOAL #4   Title  Pt will have decreased cervical and periscapular musculature restrictions to moderate in order to maximize her ROM and decrease her pain.    Time  4    Period  Weeks    Status  On-going      PT LONG TERM GOAL #5   Title  Pt will be able to perform the Deep Neck Flexor Endurace Test for 30 sec with proper form and without pain in order to demo improved functional neck strength.    Baseline  7/25: 17.8sec    Time  4    Period  Weeks    Status  On-going            Plan - 07/10/18 1609    Clinical Impression Statement  Initiated trigger point dry needling this date to address soft tissue restrictions in L upper trap and levator scap. Pt very tight in both these muscles but good twitch responses elicited during needling. Pt reporting referral of pain into her head during needling. Followed up with manual STM to improve  blood flow to these areas for continued muscle relaxation and decreasing pain. Pt reported improved headache and decreased pain to 3/10 at EOS. Continue with POC as planned, addressing mobility deficits, soft tissue restrictions, and postural strengthening.     Rehab Potential  Fair    PT Frequency  2x / week    PT Duration  4 weeks    PT Treatment/Interventions  ADLs/Self Care Home Management;Biofeedback;Cryotherapy;Electrical  Stimulation;Moist Heat;Ultrasound;DME Instruction;Functional mobility training;Therapeutic activities;Balance training;Neuromuscular re-education;Cognitive remediation;Patient/family education;Manual techniques;Scar mobilization;Passive range of motion;Dry needling;Energy conservation;Taping    PT Next Visit Plan  continue with Manual for soft tissue restrictions for upper trap, levator scap and scalenes (caution wiht paraspinals due to osteoporosis); continue stretching, and postrual strenghtening; address proper computer mechanics with work and deep flexor endurance training.    PT Home Exercise Plan  eval: supine cervical retractions; 06/14/18: decompression exercise 1-5; 7/15: theraband decompression 1-4; 7/31: upper trap and levator scap stretch (did not provide handout for these)    Consulted and Agree with Plan of Care  Patient       Patient will benefit from skilled therapeutic intervention in order to improve the following deficits and impairments:  Decreased mobility, Decreased range of motion, Decreased strength, Hypomobility, Increased fascial restricitons, Increased muscle spasms, Impaired flexibility, Improper body mechanics, Postural dysfunction, Pain  Visit Diagnosis: Abnormal posture  Cervicalgia  Other symptoms and signs involving the musculoskeletal system     Problem List Patient Active Problem List   Diagnosis Date Noted  . Abdominal pain, left lower quadrant 03/07/2017  . Nausea and vomiting in adult 03/07/2017  . Rectal bleeding 03/07/2017  . Sciatica of left side 12/12/2013  . Osteoporosis 06/28/2011       Brooke Powell PT, DPT  [facility] [address] [place], [place], [address] Phone: [phone]   Fax:  [phone]  Name: Shivaun K Caputo MRN: [id number] Date of Birth: 03/12/1946   

## 2018-07-16 ENCOUNTER — Ambulatory Visit (HOSPITAL_COMMUNITY): Payer: Medicare Other

## 2018-07-16 ENCOUNTER — Telehealth (HOSPITAL_COMMUNITY): Payer: Self-pay | Admitting: Family Medicine

## 2018-07-16 NOTE — Telephone Encounter (Signed)
07/16/18  pt left a message to cx but no reason was given

## 2018-07-18 ENCOUNTER — Ambulatory Visit (HOSPITAL_COMMUNITY): Payer: Medicare Other | Attending: Family Medicine | Admitting: Physical Therapy

## 2018-07-18 ENCOUNTER — Encounter (HOSPITAL_COMMUNITY): Payer: Self-pay | Admitting: Physical Therapy

## 2018-07-18 DIAGNOSIS — R29898 Other symptoms and signs involving the musculoskeletal system: Secondary | ICD-10-CM | POA: Insufficient documentation

## 2018-07-18 DIAGNOSIS — M542 Cervicalgia: Secondary | ICD-10-CM | POA: Diagnosis not present

## 2018-07-18 DIAGNOSIS — R293 Abnormal posture: Secondary | ICD-10-CM | POA: Diagnosis not present

## 2018-07-18 NOTE — Therapy (Signed)
[facility] [place], [name], [address] Phone: [phone]   Fax:  [phone]  Physical Therapy Treatment  Patient Details  Name: Shelly Stein MRN: [id number] Date of Birth: [birth year] Referring Provider: Sallee Lange, MD   Encounter Date: 07/18/2018  PT End of Session - 07/18/18 1554    Visit Number  10    Number of Visits  17    Date for PT Re-Evaluation  08/01/18    Authorization Type  Medicare    Authorization Time Period  06/06/18 to 07/04/18; NEW: 07/04/18 to 08/01/18    Authorization - Visit Number  10    Authorization - Number of Visits  17    PT Start Time  2820    PT Stop Time  8138    PT Time Calculation (min)  39 min    Activity Tolerance  Patient tolerated treatment well;No increased pain    Behavior During Therapy  WFL for tasks assessed/performed       Past Medical History:  Diagnosis Date  . Back pain   . Diverticulosis    mild    Past Surgical History:  Procedure Laterality Date  . ABDOMINAL HYSTERECTOMY    . BREAST BIOPSY Left 06/02/2016  . FOOT SURGERY    . SHOULDER SURGERY      There were no vitals filed for this visit.  Subjective Assessment - 07/18/18 1552    Subjective  Patient reported that she was fine following dry needling and feels that it may have improved her neck some, but is not sure.     Patient Stated Goals  get some relief    Currently in Pain?  Yes    Pain Score  5     Pain Location  Neck    Pain Orientation  Left    Pain Descriptors / Indicators  Aching    Pain Type  Acute pain    Pain Onset  More than a month ago                       [facility] Adult PT Treatment/Exercise - 07/18/18 0001      Neck Exercises: Theraband   Shoulder Extension  10 reps;Red    Rows  10 reps;Red      Neck Exercises: Standing   Neck Retraction  5 secs;Other (comment)   attempted 3 repetitions, however trouble with form   Upper Extremity Flexion with Stabilization   Flexion;Limitations;15 reps    UE Flexion with Stabilization Limitations  with retraction with 1# bar weight      Neck Exercises: Seated   Other Seated Exercise  D1 flexion with red theraband x 15 each upper extremity. Posterior shoulder rolls for improved posture x10.       Neck Exercises: Supine   Neck Retraction  10 reps;3 secs;Other (comment)   2x10     Manual Therapy   Manual Therapy  Soft tissue mobilization    Manual therapy comments  completed seperately from all other skilled itnerventions    Soft tissue mobilization  Seated position to patient's bilateral upper trapezius, levator scapulae, and scalenes.      Neck Exercises: Stretches   Upper Trapezius Stretch  Left;2 reps;30 seconds    Levator Stretch  Left;2 reps;30 seconds             PT Education - 07/18/18 1553    Education Details  Educated patient on purpose and technique of exercises throughout  session.     Person(s) Educated  Patient    Methods  Explanation    Comprehension  Verbalized understanding       PT Short Term Goals - 07/04/18 1520      PT SHORT TERM GOAL #1   Title  Pt will be independent with HEP and perform consistently in order to improve ROM and decrease pain.    Time  2    Period  Weeks    Status  Achieved      PT SHORT TERM GOAL #2   Title  Pt will have improved cervical AROM by 5 deg throughout in order to decrease pain and improve overall function.    Baseline  7/25: see AROM    Time  2    Period  Weeks    Status  Partially Met      PT SHORT TERM GOAL #3   Title  Pt will score 14/50 or < on the NDI in order to demo mild self-perceived disability due to her neck pain.    Baseline  7/25: 15/50    Time  2    Period  Weeks    Status  On-going        PT Long Term Goals - 07/04/18 1522      PT LONG TERM GOAL #1   Title  Pt will have improved cervical AROM by 10deg or > throughout in order to further decrease pain and maximzie her ability to drive with greater ease.     Baseline  7/25: see AROM    Time  4    Period  Weeks    Status  Partially Met      PT LONG TERM GOAL #2   Title  Pt will report decreased neck pain to 3/10 or better intermittently in order to allow her to complete functional tasks with greater ease and promote return to PLOF.    Baseline  7/:25: 3-4/10 on a daily basis average    Time  4    Period  Weeks    Status  Partially Met      PT LONG TERM GOAL #3   Title  Pt will report being able to sleep through the night and awaken 2x or < during the night due to pain in order to maximize her recovery.    Baseline  7/25: agrees with this statement    Time  4    Period  Weeks    Status  Achieved      PT LONG TERM GOAL #4   Title  Pt will have decreased cervical and periscapular musculature restrictions to moderate in order to maximize her ROM and decrease her pain.    Time  4    Period  Weeks    Status  On-going      PT LONG TERM GOAL #5   Title  Pt will be able to perform the Deep Neck Flexor Endurace Test for 30 sec with proper form and without pain in order to demo improved functional neck strength.    Baseline  7/25: 17.8sec    Time  4    Period  Weeks    Status  On-going            Plan - 07/18/18 1646    Clinical Impression Statement  This session continued with established plan of care. This session added posterior shoulder rolls to improve patient's posture and postural awareness as well as to warm-up. This session also  added a 1 pound weight with standing shoulder flexion with neck retraction. Patient tolerated all exercises well. Ended session with manual therapy to decrease muscular restrictions, promote relaxation and decrease pain. Patient continued to have increased tightness through the left side.     Rehab Potential  Fair    PT Frequency  2x / week    PT Duration  4 weeks    PT Treatment/Interventions  ADLs/Self Care Home Management;Biofeedback;Cryotherapy;Electrical Stimulation;Moist Heat;Ultrasound;DME  Instruction;Functional mobility training;Therapeutic activities;Balance training;Neuromuscular re-education;Cognitive remediation;Patient/family education;Manual techniques;Scar mobilization;Passive range of motion;Dry needling;Energy conservation;Taping    PT Next Visit Plan  continue with Manual for soft tissue restrictions for upper trap, levator scap and scalenes (caution wiht paraspinals due to osteoporosis); continue stretching, and postrual strenghtening; address proper computer mechanics with work and deep flexor endurance training.    PT Home Exercise Plan  eval: supine cervical retractions; 06/14/18: decompression exercise 1-5; 7/15: theraband decompression 1-4; 7/31: upper trap and levator scap stretch (did not provide handout for these)    Consulted and Agree with Plan of Care  Patient       Patient will benefit from skilled therapeutic intervention in order to improve the following deficits and impairments:  Decreased mobility, Decreased range of motion, Decreased strength, Hypomobility, Increased fascial restricitons, Increased muscle spasms, Impaired flexibility, Improper body mechanics, Postural dysfunction, Pain  Visit Diagnosis: Abnormal posture  Cervicalgia  Other symptoms and signs involving the musculoskeletal system     Problem List Patient Active Problem List   Diagnosis Date Noted  . Abdominal pain, left lower quadrant 03/07/2017  . Nausea and vomiting in adult 03/07/2017  . Rectal bleeding 03/07/2017  . Sciatica of left side 12/12/2013  . Osteoporosis 06/28/2011   Clarene Critchley PT, DPT 4:54 PM, 07/18/18 [facility] [address] [place], [name], [address] Phone: [phone]   Fax:  [phone]  Name: Shelly Stein MRN: [id number] Date of Birth: August 04, 1946

## 2018-07-19 DIAGNOSIS — M47816 Spondylosis without myelopathy or radiculopathy, lumbar region: Secondary | ICD-10-CM | POA: Diagnosis not present

## 2018-07-19 DIAGNOSIS — M4712 Other spondylosis with myelopathy, cervical region: Secondary | ICD-10-CM | POA: Diagnosis not present

## 2018-07-23 ENCOUNTER — Ambulatory Visit (INDEPENDENT_AMBULATORY_CARE_PROVIDER_SITE_OTHER): Payer: Medicare Other | Admitting: Family Medicine

## 2018-07-23 ENCOUNTER — Encounter: Payer: Self-pay | Admitting: Family Medicine

## 2018-07-23 VITALS — BP 124/80 | Ht 65.0 in | Wt 125.2 lb

## 2018-07-23 DIAGNOSIS — Z13220 Encounter for screening for lipoid disorders: Secondary | ICD-10-CM | POA: Diagnosis not present

## 2018-07-23 DIAGNOSIS — M810 Age-related osteoporosis without current pathological fracture: Secondary | ICD-10-CM

## 2018-07-23 DIAGNOSIS — I10 Essential (primary) hypertension: Secondary | ICD-10-CM

## 2018-07-23 NOTE — Progress Notes (Signed)
   Subjective:    Patient ID: Shelly Stein, female    DOB: [birth year], 72 y.o.   MRN: [id number]  HPI Pt here today to discuss bone density and restarting Boniva injections.   15 minutes was spent with patient today discussing healthcare issues which they came.  More than 50% of this visit-total duration of visit-was spent in counseling and coordination of care.  Please see diagnosis regarding the focus of this coordination and care We spent time today discussing her bone density We also discussed treatment options for osteoporosis Also discussed potential side effects of medications Patient had taken a drug holiday but because of worsening condition of her bone density it is recommended to be back on medication She states she did discuss this with her neurosurgeon who also recommended for her to be on her medicine for osteoporosis  The patient has tried oral medications for osteoporosis but did not tolerate these therefore she had received injectable we did a drug holiday to minimize her risk of atypical fractures but 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

## 2018-07-24 ENCOUNTER — Ambulatory Visit (HOSPITAL_COMMUNITY): Payer: Medicare Other | Admitting: Physical Therapy

## 2018-07-24 ENCOUNTER — Encounter (HOSPITAL_COMMUNITY): Payer: Self-pay | Admitting: Physical Therapy

## 2018-07-24 DIAGNOSIS — R29898 Other symptoms and signs involving the musculoskeletal system: Secondary | ICD-10-CM | POA: Diagnosis not present

## 2018-07-24 DIAGNOSIS — R293 Abnormal posture: Secondary | ICD-10-CM

## 2018-07-24 DIAGNOSIS — M542 Cervicalgia: Secondary | ICD-10-CM

## 2018-07-24 NOTE — Progress Notes (Signed)
I have put in our tickler file to schedule for September 2019.

## 2018-07-24 NOTE — Progress Notes (Signed)
Referral placed in Epic for September 2019 per Dr.Scotts order.

## 2018-07-24 NOTE — Addendum Note (Signed)
Addended by: Karle Barr on: 07/24/2018 10:09 AM   Modules accepted: Orders

## 2018-07-24 NOTE — Therapy (Signed)
[facility], [name], [address] Phone: [phone]   Fax:  [phone]  Physical Therapy Treatment  Patient Details  Name: Shelly Stein MRN: [id number] Date of Birth: 1946/09/20 Referring Provider: Sallee Lange, MD   Encounter Date: 07/24/2018  PT End of Session - 07/24/18 1520    Visit Number  11    Number of Visits  17    Date for PT Re-Evaluation  08/01/18    Authorization Type  Medicare    Authorization Time Period  06/06/18 to 07/04/18; NEW: 07/04/18 to 08/01/18    Authorization - Visit Number  11    Authorization - Number of Visits  17    PT Start Time  3335    PT Stop Time  4562    PT Time Calculation (min)  40 min    Activity Tolerance  Patient tolerated treatment well;No increased pain    Behavior During Therapy  WFL for tasks assessed/performed       Past Medical History:  Diagnosis Date  . Back pain   . Diverticulosis    mild    Past Surgical History:  Procedure Laterality Date  . ABDOMINAL HYSTERECTOMY    . BREAST BIOPSY Left 06/02/2016  . FOOT SURGERY    . SHOULDER SURGERY      There were no vitals filed for this visit.  Subjective Assessment - 07/24/18 1350    Subjective  Patient reported that she is doing okay today. She reported that she is aware that she will be getting dry needling again.     Patient Stated Goals  get some relief    Currently in Pain?  Yes    Pain Score  5     Pain Location  Neck    Pain Orientation  Left    Pain Descriptors / Indicators  Aching    Pain Type  Acute pain    Pain Onset  More than a month ago                       [facility] Adult PT Treatment/Exercise - 07/24/18 0001      Neck Exercises: Theraband   Shoulder Extension  10 reps;Red    Rows  10 reps;Red      Neck Exercises: Standing   Upper Extremity Flexion with Stabilization  Flexion;Limitations;15 reps    UE Flexion with Stabilization Limitations  with retraction with 2# bar  weight    Other Standing Exercises  Chest openers x 10 with arms interlaced behind back       Neck Exercises: Seated   Other Seated Exercise  D1 flexion with red theraband x 15 each upper extremity. Posterior shoulder rolls for improved posture x15.       Neck Exercises: Prone   Neck Retraction  15 reps;3 secs    Neck Retraction Limitations  Demonstration and verbal cues    W Back  15 reps      Manual Therapy   Manual Therapy  Soft tissue mobilization    Manual therapy comments  completed seperately from all other skilled itnerventions    Soft tissue mobilization  Seated position to patient's bilateral upper trapezius, levator scapulae, and scalenes. Noted left side more tight than right, and decreased muscular restrictions following manual therapy.       Neck Exercises: Stretches   Upper Trapezius Stretch  Left;2 reps;30 seconds    Levator Stretch  Left;2 reps;30 seconds  PT Education - 07/24/18 1519    Education Details  Explained purpose and technique of interventions throughout session.     Person(s) Educated  Patient    Methods  Explanation    Comprehension  Verbalized understanding       PT Short Term Goals - 07/04/18 1520      PT SHORT TERM GOAL #1   Title  Pt will be independent with HEP and perform consistently in order to improve ROM and decrease pain.    Time  2    Period  Weeks    Status  Achieved      PT SHORT TERM GOAL #2   Title  Pt will have improved cervical AROM by 5 deg throughout in order to decrease pain and improve overall function.    Baseline  7/25: see AROM    Time  2    Period  Weeks    Status  Partially Met      PT SHORT TERM GOAL #3   Title  Pt will score 14/50 or < on the NDI in order to demo mild self-perceived disability due to her neck pain.    Baseline  7/25: 15/50    Time  2    Period  Weeks    Status  On-going        PT Long Term Goals - 07/04/18 1522      PT LONG TERM GOAL #1   Title  Pt will have improved  cervical AROM by 10deg or > throughout in order to further decrease pain and maximzie her ability to drive with greater ease.    Baseline  7/25: see AROM    Time  4    Period  Weeks    Status  Partially Met      PT LONG TERM GOAL #2   Title  Pt will report decreased neck pain to 3/10 or better intermittently in order to allow her to complete functional tasks with greater ease and promote return to PLOF.    Baseline  7/:25: 3-4/10 on a daily basis average    Time  4    Period  Weeks    Status  Partially Met      PT LONG TERM GOAL #3   Title  Pt will report being able to sleep through the night and awaken 2x or < during the night due to pain in order to maximize her recovery.    Baseline  7/25: agrees with this statement    Time  4    Period  Weeks    Status  Achieved      PT LONG TERM GOAL #4   Title  Pt will have decreased cervical and periscapular musculature restrictions to moderate in order to maximize her ROM and decrease her pain.    Time  4    Period  Weeks    Status  On-going      PT LONG TERM GOAL #5   Title  Pt will be able to perform the Deep Neck Flexor Endurace Test for 30 sec with proper form and without pain in order to demo improved functional neck strength.    Baseline  7/25: 17.8sec    Time  4    Period  Weeks    Status  On-going            Plan - 07/24/18 1527    Clinical Impression Statement  This session continued with established plan of care focusing on mobility  and postural strengthening as well as manual therapy to decrease muscular restrictions. This session added chest openers in standing with therapist providing demonstration for cues. This session also progressed patient's neck retraction strengthening to having patient perform it prone on the mat table with cueing for form and also added "W" back exercise prone on mat table. Patient tolerated all exercises well. Plan to continue progression with mobility exercises and postural strengthening  exercises.     Rehab Potential  Fair    PT Frequency  2x / week    PT Duration  4 weeks    PT Treatment/Interventions  ADLs/Self Care Home Management;Biofeedback;Cryotherapy;Electrical Stimulation;Moist Heat;Ultrasound;DME Instruction;Functional mobility training;Therapeutic activities;Balance training;Neuromuscular re-education;Cognitive remediation;Patient/family education;Manual techniques;Scar mobilization;Passive range of motion;Dry needling;Energy conservation;Taping    PT Next Visit Plan  continue with Manual for soft tissue restrictions for upper trap, levator scap and scalenes (caution wiht paraspinals due to osteoporosis); continue stretching, and postrual strenghtening; address proper computer mechanics with work and deep flexor endurance training.    PT Home Exercise Plan  eval: supine cervical retractions; 06/14/18: decompression exercise 1-5; 7/15: theraband decompression 1-4; 7/31: upper trap and levator scap stretch (did not provide handout for these)    Consulted and Agree with Plan of Care  Patient       Patient will benefit from skilled therapeutic intervention in order to improve the following deficits and impairments:  Decreased mobility, Decreased range of motion, Decreased strength, Hypomobility, Increased fascial restricitons, Increased muscle spasms, Impaired flexibility, Improper body mechanics, Postural dysfunction, Pain  Visit Diagnosis: Abnormal posture  Cervicalgia  Other symptoms and signs involving the musculoskeletal system     Problem List Patient Active Problem List   Diagnosis Date Noted  . Essential hypertension 07/23/2018  . Abdominal pain, left lower quadrant 03/07/2017  . Nausea and vomiting in adult 03/07/2017  . Rectal bleeding 03/07/2017  . Sciatica of left side 12/12/2013  . Osteoporosis 06/28/2011   Clarene Critchley PT, DPT 3:28 PM, 07/24/18 [place], [name],  [address] Phone: [phone]   Fax:  [phone]  Name: Shelly Stein MRN: [id number] Date of Birth: 04-Sep-1946

## 2018-07-25 ENCOUNTER — Ambulatory Visit (HOSPITAL_COMMUNITY): Payer: Medicare Other

## 2018-07-25 ENCOUNTER — Encounter (HOSPITAL_COMMUNITY): Payer: Self-pay

## 2018-07-25 DIAGNOSIS — R29898 Other symptoms and signs involving the musculoskeletal system: Secondary | ICD-10-CM

## 2018-07-25 DIAGNOSIS — R293 Abnormal posture: Secondary | ICD-10-CM | POA: Diagnosis not present

## 2018-07-25 DIAGNOSIS — M542 Cervicalgia: Secondary | ICD-10-CM | POA: Diagnosis not present

## 2018-07-25 NOTE — Therapy (Signed)
[place], [name], [address] Phone: [phone]   Fax:  [phone]  Physical Therapy Treatment  Patient Details  Name: Shelly Stein MRN: [id number] Date of Birth: 1946/11/16 Referring Provider: Sallee Lange, MD   Encounter Date: 07/25/2018  PT End of Session - 07/25/18 1353    Visit Number  12    Number of Visits  17    Date for PT Re-Evaluation  08/01/18    Authorization Type  Medicare    Authorization Time Period  06/06/18 to 07/04/18; NEW: 07/04/18 to 08/01/18    Authorization - Visit Number  12    Authorization - Number of Visits  17    PT Start Time  4268   UBE for 4'   PT Stop Time  1430    PT Time Calculation (min)  43 min    Activity Tolerance  Patient tolerated treatment well;No increased pain    Behavior During Therapy  WFL for tasks assessed/performed       Past Medical History:  Diagnosis Date  . Back pain   . Diverticulosis    mild    Past Surgical History:  Procedure Laterality Date  . ABDOMINAL HYSTERECTOMY    . BREAST BIOPSY Left 06/02/2016  . FOOT SURGERY    . SHOULDER SURGERY      There were no vitals filed for this visit.  Subjective Assessment - 07/25/18 1351    Subjective  Pt stated she continues to have tightness on Lt side of neck, pain scale 3-4/10.    Patient Stated Goals  get some relief    Currently in Pain?  Yes    Pain Score  4     Pain Location  Neck    Pain Orientation  Left    Pain Descriptors / Indicators  Tightness    Pain Type  Acute pain    Pain Onset  More than a month ago    Pain Frequency  Constant    Aggravating Factors   nothing    Pain Relieving Factors  ice    Effect of Pain on Daily Activities  severely impacts                       [facility] Adult PT Treatment/Exercise - 07/25/18 0001      Exercises   Exercises  Neck      Neck Exercises: Machines for Strengthening   UBE (Upper Arm Bike)  4' L1 backwards      Neck Exercises:  Theraband   Shoulder Extension  10 reps;Red    Rows  10 reps;Red      Neck Exercises: Standing   Upper Extremity Flexion with Stabilization  Flexion;Limitations;15 reps    UE Flexion with Stabilization Limitations  with retraction with 2# bar weight      Neck Exercises: Seated   Shoulder Rolls  Backwards;10 reps    Shoulder Rolls Limitations  shoulders up, back and down    Other Seated Exercise  D1 flexion with red theraband x 15 each upper extremity. Posterior shoulder rolls for improved posture x15.       Neck Exercises: Supine   Other Supine Exercise  chin tuck head lift 5x 10" holds      Neck Exercises: Prone   Neck Retraction  15 reps;3 secs   pt completed 18 reps   Neck Retraction Limitations  Demonstration and verbal cues    W Back  15  reps    Rows  10 reps      Manual Therapy   Manual Therapy  Soft tissue mobilization    Manual therapy comments  completed seperately from all other skilled itnerventions    Soft tissue mobilization  Supine position to patient's bilateral upper trapezius, levator scapulae, and scalenes. Noted left side more tight than right, and decreased muscular restrictions following manual therapy.       Neck Exercises: Stretches   Upper Trapezius Stretch  2 reps;30 seconds;Right;Left    Levator Stretch  Right;Left;2 reps;30 seconds               PT Short Term Goals - 07/04/18 1520      PT SHORT TERM GOAL #1   Title  Pt will be independent with HEP and perform consistently in order to improve ROM and decrease pain.    Time  2    Period  Weeks    Status  Achieved      PT SHORT TERM GOAL #2   Title  Pt will have improved cervical AROM by 5 deg throughout in order to decrease pain and improve overall function.    Baseline  7/25: see AROM    Time  2    Period  Weeks    Status  Partially Met      PT SHORT TERM GOAL #3   Title  Pt will score 14/50 or < on the NDI in order to demo mild self-perceived disability due to her neck pain.     Baseline  7/25: 15/50    Time  2    Period  Weeks    Status  On-going        PT Long Term Goals - 07/04/18 1522      PT LONG TERM GOAL #1   Title  Pt will have improved cervical AROM by 10deg or > throughout in order to further decrease pain and maximzie her ability to drive with greater ease.    Baseline  7/25: see AROM    Time  4    Period  Weeks    Status  Partially Met      PT LONG TERM GOAL #2   Title  Pt will report decreased neck pain to 3/10 or better intermittently in order to allow her to complete functional tasks with greater ease and promote return to PLOF.    Baseline  7/:25: 3-4/10 on a daily basis average    Time  4    Period  Weeks    Status  Partially Met      PT LONG TERM GOAL #3   Title  Pt will report being able to sleep through the night and awaken 2x or < during the night due to pain in order to maximize her recovery.    Baseline  7/25: agrees with this statement    Time  4    Period  Weeks    Status  Achieved      PT LONG TERM GOAL #4   Title  Pt will have decreased cervical and periscapular musculature restrictions to moderate in order to maximize her ROM and decrease her pain.    Time  4    Period  Weeks    Status  On-going      PT LONG TERM GOAL #5   Title  Pt will be able to perform the Deep Neck Flexor Endurace Test for 30 sec with proper form and without pain in order  to demo improved functional neck strength.    Baseline  7/25: 17.8sec    Time  4    Period  Weeks    Status  On-going            Plan - 07/25/18 1458    Clinical Impression Statement  Continued session foucs with mobility and postural strengthening.  Added UBE backwards, prone row and supine chin tuck head lifting for postural strengthening.  Pt able to complete whole session wiht no reports of pain.  Min cueing to improve mechanics with cervical retraction with chin tuck head lift, was limited by fatgiue with 10" holds.  EOs with manual soft tissue mobilization to address  restrictions Lt>Rt upper traps, scalenes and levator scapula.      Rehab Potential  Fair    PT Frequency  2x / week    PT Duration  4 weeks    PT Treatment/Interventions  ADLs/Self Care Home Management;Biofeedback;Cryotherapy;Electrical Stimulation;Moist Heat;Ultrasound;DME Instruction;Functional mobility training;Therapeutic activities;Balance training;Neuromuscular re-education;Cognitive remediation;Patient/family education;Manual techniques;Scar mobilization;Passive range of motion;Dry needling;Energy conservation;Taping    PT Next Visit Plan  continue with Manual for soft tissue restrictions for upper trap, levator scap and scalenes (caution wiht paraspinals due to osteoporosis); continue stretching, and postrual strenghtening; address proper computer mechanics with work and deep flexor endurance training.    PT Home Exercise Plan  eval: supine cervical retractions; 06/14/18: decompression exercise 1-5; 7/15: theraband decompression 1-4; 7/31: upper trap and levator scap stretch (did not provide handout for these)       Patient will benefit from skilled therapeutic intervention in order to improve the following deficits and impairments:  Decreased mobility, Decreased range of motion, Decreased strength, Hypomobility, Increased fascial restricitons, Increased muscle spasms, Impaired flexibility, Improper body mechanics, Postural dysfunction, Pain  Visit Diagnosis: Abnormal posture  Cervicalgia  Other symptoms and signs involving the musculoskeletal system     Problem List Patient Active Problem List   Diagnosis Date Noted  . Essential hypertension 07/23/2018  . Abdominal pain, left lower quadrant 03/07/2017  . Nausea and vomiting in adult 03/07/2017  . Rectal bleeding 03/07/2017  . Sciatica of left side 12/12/2013  . Osteoporosis 06/28/2011   Ihor Austin, [place]; [place]  Aldona Lento 07/25/2018, 5:06 PM  [facility] [address] [place], [name], [address] Phone: [phone]   Fax:  [phone]  Name: Shelly Stein MRN: [id number] Date of Birth: 1946-03-30

## 2018-07-26 ENCOUNTER — Encounter (HOSPITAL_COMMUNITY): Payer: Medicare Other

## 2018-07-27 DIAGNOSIS — Z13220 Encounter for screening for lipoid disorders: Secondary | ICD-10-CM | POA: Diagnosis not present

## 2018-07-27 DIAGNOSIS — M810 Age-related osteoporosis without current pathological fracture: Secondary | ICD-10-CM | POA: Diagnosis not present

## 2018-07-27 DIAGNOSIS — I10 Essential (primary) hypertension: Secondary | ICD-10-CM | POA: Diagnosis not present

## 2018-07-29 LAB — LIPID PANEL
CHOLESTEROL TOTAL: 193 mg/dL (ref 100–199)
Chol/HDL Ratio: 3 ratio (ref 0.0–4.4)
HDL: 64 mg/dL (ref >39)
LDL Calculated: 115 mg/dL — ABNORMAL HIGH (ref 0–99)
TRIGLYCERIDES: 68 mg/dL (ref 0–149)
VLDL Cholesterol Cal: 14 mg/dL (ref 5–40)

## 2018-07-29 LAB — BASIC METABOLIC PANEL
BUN / CREAT RATIO: 15 (ref 12–28)
BUN: 12 mg/dL (ref 8–27)
CHLORIDE: 100 mmol/L (ref 96–106)
CO2: 23 mmol/L (ref 20–29)
Calcium: 8.8 mg/dL (ref 8.7–10.3)
Creatinine, Ser: 0.79 mg/dL (ref 0.57–1.00)
GFR calc Af Amer: 86 mL/min/{1.73_m2} (ref >59)
GFR calc non Af Amer: 75 mL/min/{1.73_m2} (ref >59)
GLUCOSE: 96 mg/dL (ref 65–99)
Potassium: 4.4 mmol/L (ref 3.5–5.2)
SODIUM: 133 mmol/L — AB (ref 134–144)

## 2018-07-29 LAB — VITAMIN D 25 HYDROXY (VIT D DEFICIENCY, FRACTURES): VIT D 25 HYDROXY: 27.7 ng/mL — AB (ref 30.0–100.0)

## 2018-07-30 ENCOUNTER — Encounter (HOSPITAL_COMMUNITY): Payer: Medicare Other

## 2018-07-31 ENCOUNTER — Ambulatory Visit (HOSPITAL_COMMUNITY): Payer: Medicare Other

## 2018-07-31 DIAGNOSIS — R29898 Other symptoms and signs involving the musculoskeletal system: Secondary | ICD-10-CM

## 2018-07-31 DIAGNOSIS — R293 Abnormal posture: Secondary | ICD-10-CM

## 2018-07-31 DIAGNOSIS — M542 Cervicalgia: Secondary | ICD-10-CM | POA: Diagnosis not present

## 2018-07-31 NOTE — Therapy (Signed)
[place], [name], [address] Phone: [phone]   Fax:  [phone]  Physical Therapy Treatment  Patient Details  Name: Shelly Stein MRN: [id number] Date of Birth: [birth year] Referring Provider: Sallee Lange, MD   Encounter Date: 07/31/2018  PT End of Session - 07/31/18 1347    Visit Number  13    Number of Visits  17    Date for PT Re-Evaluation  08/14/18    Authorization Type  Medicare    Authorization Time Period  06/06/18 to 07/04/18; NEW: 07/04/18 to 08/01/18; NEW: 07/31/18 to 08/14/18    Authorization - Visit Number  13    Authorization - Number of Visits  17    PT Start Time  8891    Activity Tolerance  Patient tolerated treatment well;No increased pain    Behavior During Therapy  WFL for tasks assessed/performed       Past Medical History:  Diagnosis Date  . Back pain   . Diverticulosis    mild    Past Surgical History:  Procedure Laterality Date  . ABDOMINAL HYSTERECTOMY    . BREAST BIOPSY Left 06/02/2016  . FOOT SURGERY    . SHOULDER SURGERY      There were no vitals filed for this visit.  Subjective Assessment - 07/31/18 1347    Subjective  Pt reports that the L side of her neck is still her main issue. If she looks R for a long time it will pull on the L but overall the R side of her neck is much better. R side neck pain is minimal and L side neck pain is about a 5/10 constant pain.    Patient Stated Goals  get some relief    Currently in Pain?  Yes    Pain Score  5     Pain Location  Neck    Pain Orientation  Left    Pain Descriptors / Indicators  Tightness    Pain Type  Acute pain    Pain Onset  More than a month ago    Pain Frequency  Constant    Aggravating Factors   nothing    Pain Relieving Factors  ice    Effect of Pain on Daily Activities  severely impacts            [facility] Adult PT Treatment/Exercise - 07/31/18 0001      Exercises   Exercises  Neck      Neck  Exercises: Seated   Other Seated Exercise  scap retraction 10x3" holds      Manual Therapy   Manual Therapy  Soft tissue mobilization    Manual therapy comments  completed seperately from all other skilled itnerventions    Soft tissue mobilization  prone: to L upper trap following needling to decrease pain and restrictions      Neck Exercises: Stretches   Upper Trapezius Stretch  Right;Left;2 reps;30 seconds    Levator Stretch  Right;Left;2 reps;30 seconds    Corner Stretch  2 reps;30 seconds       Trigger Point Dry Needling - 07/31/18 1353    Consent Given?  Yes    Education Handout Provided  No   provided at last session   Muscles Treated Upper Body  Upper trapezius    Upper Trapezius Response  Twitch reponse elicited;Palpable increased muscle length   L in prone            PT Short  Term Goals - 07/04/18 1520      PT SHORT TERM GOAL #1   Title  Pt will be independent with HEP and perform consistently in order to improve ROM and decrease pain.    Time  2    Period  Weeks    Status  Achieved      PT SHORT TERM GOAL #2   Title  Pt will have improved cervical AROM by 5 deg throughout in order to decrease pain and improve overall function.    Baseline  7/25: see AROM    Time  2    Period  Weeks    Status  Partially Met      PT SHORT TERM GOAL #3   Title  Pt will score 14/50 or < on the NDI in order to demo mild self-perceived disability due to her neck pain.    Baseline  7/25: 15/50    Time  2    Period  Weeks    Status  On-going        PT Long Term Goals - 07/04/18 1522      PT LONG TERM GOAL #1   Title  Pt will have improved cervical AROM by 10deg or > throughout in order to further decrease pain and maximzie her ability to drive with greater ease.    Baseline  7/25: see AROM    Time  4    Period  Weeks    Status  Partially Met      PT LONG TERM GOAL #2   Title  Pt will report decreased neck pain to 3/10 or better intermittently in order to allow her  to complete functional tasks with greater ease and promote return to PLOF.    Baseline  7/:25: 3-4/10 on a daily basis average    Time  4    Period  Weeks    Status  Partially Met      PT LONG TERM GOAL #3   Title  Pt will report being able to sleep through the night and awaken 2x or < during the night due to pain in order to maximize her recovery.    Baseline  7/25: agrees with this statement    Time  4    Period  Weeks    Status  Achieved      PT LONG TERM GOAL #4   Title  Pt will have decreased cervical and periscapular musculature restrictions to moderate in order to maximize her ROM and decrease her pain.    Time  4    Period  Weeks    Status  On-going      PT LONG TERM GOAL #5   Title  Pt will be able to perform the Deep Neck Flexor Endurace Test for 30 sec with proper form and without pain in order to demo improved functional neck strength.    Baseline  7/25: 17.8sec    Time  4    Period  Weeks    Status  On-going            Plan - 07/31/18 1429    Clinical Impression Statement  Resumed trigger point dry needling this date to L upper trap in order to decrease restrictions and pain. Good twitch responses elicited throughout and reports of pain recreation as well as pain into head were reported during needling indicating active trigger points. Followed up with manual STM to L upper trap area in order to increase blood flow to  the area so as to further decrease pain and soft tissue restrictions. Ended with muscle stretching and activation. Pt reported decreased tightness to 2/10 at EOS (was 5/10). PT extending pt's PT Cert today to cover remaining approved visits in order to continue dry needling to assess response and further decrease pain.    Rehab Potential  Fair    PT Frequency  2x / week    PT Duration  2 weeks    PT Treatment/Interventions  ADLs/Self Care Home Management;Biofeedback;Cryotherapy;Electrical Stimulation;Moist Heat;Ultrasound;DME Instruction;Functional  mobility training;Therapeutic activities;Balance training;Neuromuscular re-education;Cognitive remediation;Patient/family education;Manual techniques;Scar mobilization;Passive range of motion;Dry needling;Energy conservation;Taping    PT Next Visit Plan  continue dry needling upper trap; continue with Manual for soft tissue restrictions for upper trap, levator scap and scalenes (caution wiht paraspinals due to osteoporosis); continue stretching, and postrual strenghtening; address proper computer mechanics with work and deep flexor endurance training.    PT Home Exercise Plan  eval: supine cervical retractions; 06/14/18: decompression exercise 1-5; 7/15: theraband decompression 1-4; 7/31: upper trap and levator scap stretch (did not provide handout for these)    Consulted and Agree with Plan of Care  Patient       Patient will benefit from skilled therapeutic intervention in order to improve the following deficits and impairments:  Decreased mobility, Decreased range of motion, Decreased strength, Hypomobility, Increased fascial restricitons, Increased muscle spasms, Impaired flexibility, Improper body mechanics, Postural dysfunction, Pain  Visit Diagnosis: Abnormal posture - Plan: PT plan of care cert/re-cert  Cervicalgia - Plan: PT plan of care cert/re-cert  Other symptoms and signs involving the musculoskeletal system - Plan: PT plan of care cert/re-cert     Problem List Patient Active Problem List   Diagnosis Date Noted  . Essential hypertension 07/23/2018  . Abdominal pain, left lower quadrant 03/07/2017  . Nausea and vomiting in adult 03/07/2017  . Rectal bleeding 03/07/2017  . Sciatica of left side 12/12/2013  . Osteoporosis 06/28/2011      Geraldine Solar PT, DPT  [facility] [address] [place], [name], [address] Phone: [phone]   Fax:  [phone]  Name: CHELSEA PEDRETTI MRN: [id number] Date of Birth: 14-Feb-1946

## 2018-08-01 ENCOUNTER — Encounter (HOSPITAL_COMMUNITY): Payer: Medicare Other

## 2018-08-06 ENCOUNTER — Encounter (HOSPITAL_COMMUNITY): Payer: Self-pay

## 2018-08-06 ENCOUNTER — Ambulatory Visit (HOSPITAL_COMMUNITY): Payer: Medicare Other

## 2018-08-06 DIAGNOSIS — R29898 Other symptoms and signs involving the musculoskeletal system: Secondary | ICD-10-CM

## 2018-08-06 DIAGNOSIS — R293 Abnormal posture: Secondary | ICD-10-CM | POA: Diagnosis not present

## 2018-08-06 DIAGNOSIS — M542 Cervicalgia: Secondary | ICD-10-CM

## 2018-08-06 NOTE — Therapy (Signed)
[place]helly [place], [name], [address] Phone: [phone]   Fax:  [phone]  Physical Therapy Treatment  Patient Details  Name: Shelly Stein MRN: [id number] Date of Birth: 11-14-1946 Referring Provider: Sallee Lange, MD   Encounter Date: 08/06/2018  PT End of Session - 08/06/18 1347    Visit Number  14    Number of Visits  17    Date for PT Re-Evaluation  08/14/18    Authorization Type  Medicare    Authorization Time Period  06/06/18 to 07/04/18; NEW: 07/04/18 to 08/01/18; NEW: 07/31/18 to 08/14/18    Authorization - Visit Number  14    Authorization - Number of Visits  17    PT Start Time  1103    PT Stop Time  1594    PT Time Calculation (min)  43 min    Activity Tolerance  Patient tolerated treatment well;No increased pain    Behavior During Therapy  WFL for tasks assessed/performed       Past Medical History:  Diagnosis Date  . Back pain   . Diverticulosis    mild    Past Surgical History:  Procedure Laterality Date  . ABDOMINAL HYSTERECTOMY    . BREAST BIOPSY Left 06/02/2016  . FOOT SURGERY    . SHOULDER SURGERY      There were no vitals filed for this visit.  Subjective Assessment - 08/06/18 1302    Subjective  Pt stated she has some pain on Lt side of her neck, pain scale 4/10 today.  Reports she doesn't feel as tight today as she has in the past though is still present.      Patient Stated Goals  get some relief    Currently in Pain?  Yes    Pain Score  4     Pain Location  Neck    Pain Orientation  Left    Pain Descriptors / Indicators  Tightness    Pain Type  Acute pain    Pain Onset  More than a month ago    Pain Frequency  Constant    Aggravating Factors   nothing    Pain Relieving Factors  ice     Effect of Pain on Daily Activities  severely impacts                       [facility] Adult PT Treatment/Exercise - 08/06/18 0001      Exercises   Exercises  Neck      Neck  Exercises: Machines for Strengthening   UBE (Upper Arm Bike)  4' L1 backwards      Neck Exercises: Theraband   Shoulder Extension  10 reps;Red    Rows  10 reps;Red      Neck Exercises: Standing   Upper Extremity Flexion with Stabilization  Flexion;Limitations;15 reps    UE Flexion with Stabilization Limitations  with retraction with 2# bar weight    Other Standing Exercises  arch on wall 10x      Neck Exercises: Seated   Shoulder Rolls  Backwards;10 reps    Shoulder Rolls Limitations  shoulders up, back and down      Neck Exercises: Supine   Neck Retraction  10 reps;3 secs;Other (comment)    Other Supine Exercise  chin tuck head lift 5x 10" holds      Neck Exercises: Prone   Neck Retraction  15 reps;3 secs    Neck Retraction  Limitations  chin tuck, head lift    W Back  15 reps    Shoulder Extension  10 reps    Rows  15 reps;Limitations    Rows Limitations  cueing for mechanics      Manual Therapy   Manual Therapy  Soft tissue mobilization    Manual therapy comments  completed seperately from all other skilled itnerventions    Soft tissue mobilization  supine: Lt upper trap and scalenes      Neck Exercises: Stretches   Upper Trapezius Stretch  Right;Left;2 reps;30 seconds               PT Short Term Goals - 07/04/18 1520      PT SHORT TERM GOAL #1   Title  Pt will be independent with HEP and perform consistently in order to improve ROM and decrease pain.    Time  2    Period  Weeks    Status  Achieved      PT SHORT TERM GOAL #2   Title  Pt will have improved cervical AROM by 5 deg throughout in order to decrease pain and improve overall function.    Baseline  7/25: see AROM    Time  2    Period  Weeks    Status  Partially Met      PT SHORT TERM GOAL #3   Title  Pt will score 14/50 or < on the NDI in order to demo mild self-perceived disability due to her neck pain.    Baseline  7/25: 15/50    Time  2    Period  Weeks    Status  On-going        PT  Long Term Goals - 07/04/18 1522      PT LONG TERM GOAL #1   Title  Pt will have improved cervical AROM by 10deg or > throughout in order to further decrease pain and maximzie her ability to drive with greater ease.    Baseline  7/25: see AROM    Time  4    Period  Weeks    Status  Partially Met      PT LONG TERM GOAL #2   Title  Pt will report decreased neck pain to 3/10 or better intermittently in order to allow her to complete functional tasks with greater ease and promote return to PLOF.    Baseline  7/:25: 3-4/10 on a daily basis average    Time  4    Period  Weeks    Status  Partially Met      PT LONG TERM GOAL #3   Title  Pt will report being able to sleep through the night and awaken 2x or < during the night due to pain in order to maximize her recovery.    Baseline  7/25: agrees with this statement    Time  4    Period  Weeks    Status  Achieved      PT LONG TERM GOAL #4   Title  Pt will have decreased cervical and periscapular musculature restrictions to moderate in order to maximize her ROM and decrease her pain.    Time  4    Period  Weeks    Status  On-going      PT LONG TERM GOAL #5   Title  Pt will be able to perform the Deep Neck Flexor Endurace Test for 30 sec with proper form and without pain in  order to demo improved functional neck strength.    Baseline  7/25: 17.8sec    Time  4    Period  Weeks    Status  On-going            Plan - 08/06/18 1348    Clinical Impression Statement  Session focus with cervical/thoracic spinal mobility and postural strengthening.  Added wall arch and prone postural strengthening exercises with cueing to relax upper trapezius contraction and focus more with rhomboid activation.  Continued with upper trap stretches and manual soft tissue mobilization to address restrictions, able to reduce though unable to resolve Lt upper traps tightness.  EOS pt reports pain reduced did reports UEs were tired.      Rehab Potential  Fair     PT Frequency  2x / week    PT Duration  2 weeks    PT Treatment/Interventions  ADLs/Self Care Home Management;Biofeedback;Cryotherapy;Electrical Stimulation;Moist Heat;Ultrasound;DME Instruction;Functional mobility training;Therapeutic activities;Balance training;Neuromuscular re-education;Cognitive remediation;Patient/family education;Manual techniques;Scar mobilization;Passive range of motion;Dry needling;Energy conservation;Taping    PT Next Visit Plan  continue dry needling upper trap; continue with Manual for soft tissue restrictions for upper trap, levator scap and scalenes (caution wiht paraspinals due to osteoporosis); continue stretching, and postrual strenghtening; address proper computer mechanics with work and deep flexor endurance training.    PT Home Exercise Plan  eval: supine cervical retractions; 06/14/18: decompression exercise 1-5; 7/15: theraband decompression 1-4; 7/31: upper trap and levator scap stretch (did not provide handout for these)       Patient will benefit from skilled therapeutic intervention in order to improve the following deficits and impairments:  Decreased mobility, Decreased range of motion, Decreased strength, Hypomobility, Increased fascial restricitons, Increased muscle spasms, Impaired flexibility, Improper body mechanics, Postural dysfunction, Pain  Visit Diagnosis: Abnormal posture  Cervicalgia  Other symptoms and signs involving the musculoskeletal system     Problem List Patient Active Problem List   Diagnosis Date Noted  . Essential hypertension 07/23/2018  . Abdominal pain, left lower quadrant 03/07/2017  . Nausea and vomiting in adult 03/07/2017  . Rectal bleeding 03/07/2017  . Sciatica of left side 12/12/2013  . Osteoporosis 06/28/2011   Ihor Austin, [place]; [place]  Aldona Lento 08/06/2018, 1:53 PM  [facility] [address] [place], [name], [address] Phone:  [phone]   Fax:  [phone]  Name: AVIANCE COOPERWOOD MRN: [id number] Date of Birth: 1946/11/03

## 2018-08-07 ENCOUNTER — Telehealth: Payer: Self-pay | Admitting: Family Medicine

## 2018-08-07 NOTE — Telephone Encounter (Signed)
Please sign & date order for [facility], forward to [place] to be faxed & documented  In red folder in yellow box

## 2018-08-08 ENCOUNTER — Ambulatory Visit (HOSPITAL_COMMUNITY): Payer: Medicare Other

## 2018-08-08 ENCOUNTER — Encounter (HOSPITAL_COMMUNITY): Payer: Self-pay

## 2018-08-08 DIAGNOSIS — R29898 Other symptoms and signs involving the musculoskeletal system: Secondary | ICD-10-CM | POA: Diagnosis not present

## 2018-08-08 DIAGNOSIS — R293 Abnormal posture: Secondary | ICD-10-CM | POA: Diagnosis not present

## 2018-08-08 DIAGNOSIS — M542 Cervicalgia: Secondary | ICD-10-CM

## 2018-08-08 NOTE — Therapy (Signed)
[place], [name], [address] Phone: [phone]   Fax:  [phone]  Physical Therapy Treatment  Patient Details  Name: Shelly Stein MRN: [id number] Date of Birth: July 09, 1946 Referring Provider: Sallee Lange, MD   Encounter Date: 08/08/2018  PT End of Session - 08/08/18 1257    Visit Number  15    Number of Visits  17    Date for PT Re-Evaluation  08/14/18    Authorization Type  Medicare    Authorization Time Period  06/06/18 to 07/04/18; NEW: 07/04/18 to 08/01/18; NEW: 07/31/18 to 08/14/18    Authorization - Visit Number  15    Authorization - Number of Visits  17    PT Start Time  7482    PT Stop Time  1340    PT Time Calculation (min)  41 min    Activity Tolerance  Patient tolerated treatment well;No increased pain    Behavior During Therapy  WFL for tasks assessed/performed       Past Medical History:  Diagnosis Date  . Back pain   . Diverticulosis    mild    Past Surgical History:  Procedure Laterality Date  . ABDOMINAL HYSTERECTOMY    . BREAST BIOPSY Left 06/02/2016  . FOOT SURGERY    . SHOULDER SURGERY      There were no vitals filed for this visit.  Subjective Assessment - 08/08/18 1300    Subjective  Pt reports that her neck is slowly getting better but overall she's better. She doesn't feel as tight as she once was.     Patient Stated Goals  get some relief    Currently in Pain?  Yes    Pain Score  3     Pain Location  Neck    Pain Orientation  Left    Pain Descriptors / Indicators  Tightness    Pain Type  Acute pain    Pain Onset  More than a month ago    Pain Frequency  Constant    Aggravating Factors   nothing    Pain Relieving Factors  ice    Effect of Pain on Daily Activities  severely impacts            [facility] Adult PT Treatment/Exercise - 08/08/18 0001      Exercises   Exercises  Neck      Neck Exercises: Supine   Neck Retraction Limitations  2x12 reps during moist  heat for increased muscle relaxation and decreased pain      Modalities   Modalities  Moist Heat      Moist Heat Therapy   Number Minutes Moist Heat  8 Minutes    Moist Heat Location  Cervical   for improved blood flow and decreased pain +cervical retract     Manual Therapy   Manual Therapy  Soft tissue mobilization;Taping    Manual therapy comments  completed seperately from all other skilled itnerventions    Soft tissue mobilization  STM to bil upper trap and levator scap after needling to decrease pain and restrictions, L>R    Kinesiotex  Inhibit Muscle      Kinesiotix   Inhibit Muscle   2 Y-strips to L levator scap and upper trap to decrease pain and promote muscle relaxation       Trigger Point Dry Needling - 08/08/18 1333    Consent Given?  Yes    Education Handout Provided  No  Muscles Treated Upper Body  Upper trapezius;Levator scapulae    Upper Trapezius Response  Twitch reponse elicited;Palpable increased muscle length   L in prone   Levator Scapulae Response  Twitch response elicited;Palpable increased muscle length   L in prone             PT Short Term Goals - 07/04/18 1520      PT SHORT TERM GOAL #1   Title  Pt will be independent with HEP and perform consistently in order to improve ROM and decrease pain.    Time  2    Period  Weeks    Status  Achieved      PT SHORT TERM GOAL #2   Title  Pt will have improved cervical AROM by 5 deg throughout in order to decrease pain and improve overall function.    Baseline  7/25: see AROM    Time  2    Period  Weeks    Status  Partially Met      PT SHORT TERM GOAL #3   Title  Pt will score 14/50 or < on the NDI in order to demo mild self-perceived disability due to her neck pain.    Baseline  7/25: 15/50    Time  2    Period  Weeks    Status  On-going        PT Long Term Goals - 07/04/18 1522      PT LONG TERM GOAL #1   Title  Pt will have improved cervical AROM by 10deg or > throughout in order  to further decrease pain and maximzie her ability to drive with greater ease.    Baseline  7/25: see AROM    Time  4    Period  Weeks    Status  Partially Met      PT LONG TERM GOAL #2   Title  Pt will report decreased neck pain to 3/10 or better intermittently in order to allow her to complete functional tasks with greater ease and promote return to PLOF.    Baseline  7/:25: 3-4/10 on a daily basis average    Time  4    Period  Weeks    Status  Partially Met      PT LONG TERM GOAL #3   Title  Pt will report being able to sleep through the night and awaken 2x or < during the night due to pain in order to maximize her recovery.    Baseline  7/25: agrees with this statement    Time  4    Period  Weeks    Status  Achieved      PT LONG TERM GOAL #4   Title  Pt will have decreased cervical and periscapular musculature restrictions to moderate in order to maximize her ROM and decrease her pain.    Time  4    Period  Weeks    Status  On-going      PT LONG TERM GOAL #5   Title  Pt will be able to perform the Deep Neck Flexor Endurace Test for 30 sec with proper form and without pain in order to demo improved functional neck strength.    Baseline  7/25: 17.8sec    Time  4    Period  Weeks    Status  On-going            Plan - 08/08/18 1342    Clinical Impression Statement  Resumed trigger  point dry needling this date. Pt with good twitch responses out of L upper trap and some out of levator scap. Followed up with manual STM for increased blood flow to the area in order to further decrease pain and promote tissue healing. Ended with cervical retractions while cervical moist heat applied in order to decrease pain and improve posture. Applied k-tape to L upper trap levator scap with 2 Y strips in order to promote muscle relaxation and decreased pain. Pt reporting her lowest pain rating to date at 1/10 at EOS; was 4/10 at beginning. Continue as planned, progressing as able.     Rehab  Potential  Fair    PT Frequency  2x / week    PT Duration  2 weeks    PT Treatment/Interventions  ADLs/Self Care Home Management;Biofeedback;Cryotherapy;Electrical Stimulation;Moist Heat;Ultrasound;DME Instruction;Functional mobility training;Therapeutic activities;Balance training;Neuromuscular re-education;Cognitive remediation;Patient/family education;Manual techniques;Scar mobilization;Passive range of motion;Dry needling;Energy conservation;Taping    PT Next Visit Plan  assess response to k-tape and apply again if +; continue dry needling upper trap; continue with Manual for soft tissue restrictions for upper trap, levator scap and scalenes (caution wiht paraspinals due to osteoporosis); continue stretching, and postrual strenghtening; address proper computer mechanics with work and deep flexor endurance training.    PT Home Exercise Plan  eval: supine cervical retractions; 06/14/18: decompression exercise 1-5; 7/15: theraband decompression 1-4; 7/31: upper trap and levator scap stretch (did not provide handout for these)    Consulted and Agree with Plan of Care  Patient       Patient will benefit from skilled therapeutic intervention in order to improve the following deficits and impairments:  Decreased mobility, Decreased range of motion, Decreased strength, Hypomobility, Increased fascial restricitons, Increased muscle spasms, Impaired flexibility, Improper body mechanics, Postural dysfunction, Pain  Visit Diagnosis: Abnormal posture  Cervicalgia  Other symptoms and signs involving the musculoskeletal system     Problem List Patient Active Problem List   Diagnosis Date Noted  . Essential hypertension 07/23/2018  . Abdominal pain, left lower quadrant 03/07/2017  . Nausea and vomiting in adult 03/07/2017  . Rectal bleeding 03/07/2017  . Sciatica of left side 12/12/2013  . Osteoporosis 06/28/2011        Geraldine Solar PT, DPT  [facility] [address] [place], [name], [address] Phone: [phone]   Fax:  [phone]  Name: Shelly Stein MRN: [id number] Date of Birth: 10/02/1946

## 2018-08-13 ENCOUNTER — Telehealth (HOSPITAL_COMMUNITY): Payer: Self-pay | Admitting: Family Medicine

## 2018-08-13 ENCOUNTER — Ambulatory Visit (HOSPITAL_COMMUNITY): Payer: Medicare Other

## 2018-08-13 NOTE — Telephone Encounter (Signed)
08/13/18  left a message and said she just got home this morning at 7am from being at [facility] all night with a sick relative

## 2018-08-14 ENCOUNTER — Ambulatory Visit (HOSPITAL_COMMUNITY): Payer: Medicare Other | Admitting: Physical Therapy

## 2018-08-14 ENCOUNTER — Telehealth (HOSPITAL_COMMUNITY): Payer: Self-pay | Admitting: Family Medicine

## 2018-08-14 NOTE — Telephone Encounter (Signed)
08/14/18  Pt left a message to cx said she had a call to go to [facility], her relative is critical... She will call us back to schedule or see if she needs to schedule more.

## 2018-08-15 NOTE — Telephone Encounter (Signed)
Sent order to [facility] & filed

## 2018-08-22 ENCOUNTER — Encounter (HOSPITAL_COMMUNITY): Payer: Self-pay

## 2018-08-22 ENCOUNTER — Encounter (HOSPITAL_COMMUNITY)
Admission: RE | Admit: 2018-08-22 | Discharge: 2018-08-22 | Disposition: A | Discharge status: Home / Self Care | Payer: Medicare Other | Source: Ambulatory Visit | Attending: Family Medicine | Admitting: Family Medicine

## 2018-08-22 DIAGNOSIS — M810 Age-related osteoporosis without current pathological fracture: Secondary | ICD-10-CM | POA: Diagnosis not present

## 2018-08-22 MED ORDER — IBANDRONATE SODIUM 3 MG/3ML IV SOLN
INTRAVENOUS | Status: AC
  Filled 2018-08-22: qty 3

## 2018-08-22 MED ORDER — IBANDRONATE SODIUM 3 MG/3ML IV SOLN
3.0000 mg | Freq: Once | INTRAVENOUS | Status: AC
  Administered 2018-08-22: 3 mg via INTRAVENOUS

## 2018-09-10 ENCOUNTER — Telehealth (HOSPITAL_COMMUNITY): Payer: Self-pay

## 2018-09-10 NOTE — Telephone Encounter (Signed)
Called pt regarding potential discharge or continuing therapy since she had not been seen in a few weeks; pt verbalizing that she would like to continue but would call back later today when she had her calendar in front of her.  Geraldine Solar PT, DPT

## 2018-09-18 ENCOUNTER — Encounter (HOSPITAL_COMMUNITY): Payer: Self-pay

## 2018-09-18 ENCOUNTER — Ambulatory Visit (HOSPITAL_COMMUNITY): Payer: Medicare Other | Attending: Family Medicine

## 2018-09-18 DIAGNOSIS — R293 Abnormal posture: Secondary | ICD-10-CM | POA: Insufficient documentation

## 2018-09-18 DIAGNOSIS — R29898 Other symptoms and signs involving the musculoskeletal system: Secondary | ICD-10-CM | POA: Insufficient documentation

## 2018-09-18 DIAGNOSIS — M542 Cervicalgia: Secondary | ICD-10-CM | POA: Diagnosis not present

## 2018-09-18 NOTE — Therapy (Addendum)
[place], [name], [address] Phone: [phone]   Fax:  [phone]   Progress Note Reporting Period 08/08/18 to 09/18/18  See note below for Objective Data and Assessment of Progress/Goals.   Physical Therapy Treatment  Patient Details  Name: Shelly Stein MRN: [id number] Date of Birth: Feb 01, 1946 Referring Provider (PT): Sallee Lange, MD   Encounter Date: 09/18/2018  PT End of Session - 09/18/18 1118    Visit Number  16    Number of Visits  25    Date for PT Re-Evaluation  10/16/18    Authorization Type  Medicare    Authorization Time Period  09/18/18 to 10/16/18    Authorization - Visit Number  16    Authorization - Number of Visits  25    PT Start Time  1275    PT Stop Time  1201    PT Time Calculation (min)  44 min    Activity Tolerance  Patient tolerated treatment well;No increased pain    Behavior During Therapy  WFL for tasks assessed/performed       Past Medical History:  Diagnosis Date  . Back pain   . Diverticulosis    mild    Past Surgical History:  Procedure Laterality Date  . ABDOMINAL HYSTERECTOMY    . BREAST BIOPSY Left 06/02/2016  . FOOT SURGERY    . SHOULDER SURGERY      There were no vitals filed for this visit.  Subjective Assessment - 09/18/18 1118    Subjective  Pt states that she has been taking care of her family member (no physically assistance). She states that her neck pain has increased since her last PT visit. The left side is still hurting worse than the R side. Moving her neck is what she is still having the most difficulty with.     Patient Stated Goals  get some relief    Currently in Pain?  Yes    Pain Score  6     Pain Location  Neck    Pain Orientation  Left    Pain Descriptors / Indicators  Tightness;Constant    Pain Type  Chronic pain    Pain Onset  More than a month ago    Pain Frequency  Constant    Aggravating Factors   movement    Pain Relieving  Factors  alternating heat and ice    Effect of Pain on Daily Activities  severely impacts         [facility] PT Assessment - 09/18/18 0001      Assessment   Medical Diagnosis  Neck pain    Referring Provider (PT)  Sallee Lange, MD    Next MD Visit  no f/u appt with Dr. Wolfgang Phoenix as of yet - mainly following her for Boniva injections for osteoporosis   Glenna Fellows, MD (neurosurgeon following pt) February 2020     Observation/Other Assessments   Neck Disability Index   --   was 15/50     Other:   Other/ Comments  DNFE test: 17.7sec   was DNFE test: 17.8sec      AROM   Cervical Flexion  37   was 53   Cervical Extension  48   was 47   Cervical - Right Side Bend  30   was 21   Cervical - Left Side Bend  22   was 25   Cervical - Right Rotation  65   was  58   Cervical - Left Rotation  51   was 64     Palpation   Palpation comment  max restrictions of upper trap, levator scap, cervical/thoracic paraspinals, suboccipitals L>R thorughout with increased pain; recreated her same pain   was mod to max restrictions throughout cervical mm               [facility] Adult PT Treatment/Exercise - 09/18/18 0001      Manual Therapy   Manual Therapy  Soft tissue mobilization    Manual therapy comments  completed seperately from all other skilled itnerventions    Soft tissue mobilization  STM after needling to L upper trap to reduce restrictions and pain       Trigger Point Dry Needling - 09/18/18 1211    Consent Given?  Yes    Education Handout Provided  No    Muscles Treated Upper Body  Upper trapezius    Upper Trapezius Response  Twitch reponse elicited;Palpable increased muscle length   L in prone            PT Short Term Goals - 09/18/18 1122      PT SHORT TERM GOAL #1   Title  Pt will be independent with HEP and perform consistently in order to improve ROM and decrease pain.    Time  2    Period  Weeks    Status  Achieved      PT SHORT TERM GOAL #2   Title  Pt will  have improved cervical AROM by 5 deg throughout in order to decrease pain and improve overall function.    Baseline  10/9: see AROM    Time  2    Period  Weeks    Status  Partially Met      PT SHORT TERM GOAL #3   Title  Pt will score 14/50 or < on the NDI in order to demo mild self-perceived disability due to her neck pain.    Baseline  10/9: 10/50    Time  2    Period  Weeks    Status  Achieved        PT Long Term Goals - 09/18/18 1123      PT LONG TERM GOAL #1   Title  Pt will have improved cervical AROM by 10deg or > throughout in order to further decrease pain and maximzie her ability to drive with greater ease.    Baseline  10/9: see AROM    Time  4    Period  Weeks    Status  Partially Met      PT LONG TERM GOAL #2   Title  Pt will report decreased neck pain to 3/10 or better intermittently in order to allow her to complete functional tasks with greater ease and promote return to PLOF.    Baseline  10/9: average daily pain has increased to 6/10 since last being seen    Time  4    Period  Weeks    Status  On-going      PT LONG TERM GOAL #3   Title  Pt will report being able to sleep through the night and awaken 2x or < during the night due to pain in order to maximize her recovery.    Baseline  10/9: pain is waking pt back up at night, multiple times in a night    Time  4    Period  Weeks    Status  On-going      PT LONG TERM GOAL #4   Title  Pt will have decreased cervical and periscapular musculature restrictions to moderate in order to maximize her ROM and decrease her pain.    Baseline  10/9: increased to max restrictions    Time  4    Period  Weeks    Status  On-going      PT LONG TERM GOAL #5   Title  Pt will be able to perform the Deep Neck Flexor Endurace Test for 30 sec with proper form and without pain in order to demo improved functional neck strength.    Baseline  10/9: 17.7sec    Time  4    Period  Weeks    Status  On-going             Plan - 09/18/18 1208    Clinical Impression Statement  Pt returns to therapy for reassessment after approximately 6 weeks out of therapy due to family commitments. Pt states that she has been as compliant as she can be with her HEP, however, her neck pain has increased back to almost its prior level since being out of therapy. She has had a decrease in neck ROM from the last reassessment and her soft tissue restrictions have increased back to maximal restrictions throughout, L worse than R. Pt needs continued skilled PT intervention to address these impairments in order to decrease pain, improve function, and improve QOL. Pt had a very positive response to dry needling her last time here so PT resumed it this date at [place]. Focused on L upper trap and then followed up with manual STM for continued pain control and reducing restrictions. Pt stated she had 3/10 pain at EOS, much improved from 6/10 at beginning. No updates made to goals as PT feels these are still appropriate for patient.     Rehab Potential  Fair    PT Frequency  2x / week    PT Duration  4 weeks    PT Treatment/Interventions  ADLs/Self Care Home Management;Biofeedback;Cryotherapy;Electrical Stimulation;Moist Heat;Ultrasound;DME Instruction;Functional mobility training;Therapeutic activities;Balance training;Neuromuscular re-education;Cognitive remediation;Patient/family education;Manual techniques;Scar mobilization;Passive range of motion;Dry needling;Energy conservation;Taping    PT Next Visit Plan  continue dry needling upper trap, k-tape again; continue with Manual for soft tissue restrictions for upper trap, levator scap and scalenes (caution wiht paraspinals due to osteoporosis); continue stretching, and postrual strenghtening; address proper computer mechanics with work and deep flexor endurance training.    PT Home Exercise Plan  eval: supine cervical retractions; 06/14/18: decompression exercise 1-5; 7/15: theraband  decompression 1-4; 7/31: upper trap and levator scap stretch (did not provide handout for these)    Consulted and Agree with Plan of Care  Patient       Patient will benefit from skilled therapeutic intervention in order to improve the following deficits and impairments:  Decreased mobility, Decreased range of motion, Decreased strength, Hypomobility, Increased fascial restricitons, Increased muscle spasms, Impaired flexibility, Improper body mechanics, Postural dysfunction, Pain  Visit Diagnosis: Abnormal posture - Plan: PT plan of care cert/re-cert  Cervicalgia - Plan: PT plan of care cert/re-cert  Other symptoms and signs involving the musculoskeletal system - Plan: PT plan of care cert/re-cert     Problem List Patient Active Problem List   Diagnosis Date Noted  . Essential hypertension 07/23/2018  . Abdominal pain, left lower quadrant 03/07/2017  . Nausea and vomiting in adult 03/07/2017  . Rectal bleeding 03/07/2017  . Sciatica of left side 12/12/2013  .  Osteoporosis 06/28/2011       Shelly Stein PT, DPT  [facility] [address] [place], [name], [address] Phone: [phone]   Fax:  [phone]  Name: Shelly Stein MRN: [id number] Date of Birth: 04-16-1946

## 2018-09-20 ENCOUNTER — Telehealth (HOSPITAL_COMMUNITY): Payer: Self-pay | Admitting: Family Medicine

## 2018-09-20 NOTE — Telephone Encounter (Signed)
09/20/18  Called to offer a 10:30 appt today but she said that she was working this morning for a coworker and couldn't get off for an hour to come in.

## 2018-09-23 ENCOUNTER — Telehealth (HOSPITAL_COMMUNITY): Payer: Self-pay | Admitting: Family Medicine

## 2018-09-23 NOTE — Telephone Encounter (Signed)
09/23/18  Left a message to offer 3:15 on Tuesday with Brooke.

## 2018-09-24 ENCOUNTER — Encounter (HOSPITAL_COMMUNITY): Payer: Self-pay

## 2018-09-24 ENCOUNTER — Ambulatory Visit (HOSPITAL_COMMUNITY): Payer: Medicare Other

## 2018-09-24 DIAGNOSIS — R293 Abnormal posture: Secondary | ICD-10-CM | POA: Diagnosis not present

## 2018-09-24 DIAGNOSIS — M542 Cervicalgia: Secondary | ICD-10-CM

## 2018-09-24 DIAGNOSIS — R29898 Other symptoms and signs involving the musculoskeletal system: Secondary | ICD-10-CM

## 2018-09-24 NOTE — Therapy (Signed)
[name], [name], [address] Phone: [phone]   Fax:  [phone]  Physical Therapy Treatment  Patient Details  Name: Shelly Stein MRN: [id number] Date of Birth: September 16, 1946 Referring Provider (PT): Sallee Lange, MD   Encounter Date: 09/24/2018  PT End of Session - 09/24/18 1516    Visit Number  17    Number of Visits  25    Date for PT Re-Evaluation  10/16/18    Authorization Type  Medicare    Authorization Time Period  09/18/18 to 10/16/18    Authorization - Visit Number  17    Authorization - Number of Visits  25    PT Start Time  0354    PT Stop Time  1606    PT Time Calculation (min)  50 min    Activity Tolerance  Patient tolerated treatment well;No increased pain    Behavior During Therapy  WFL for tasks assessed/performed       Past Medical History:  Diagnosis Date  . Back pain   . Diverticulosis    mild    Past Surgical History:  Procedure Laterality Date  . ABDOMINAL HYSTERECTOMY    . BREAST BIOPSY Left 06/02/2016  . FOOT SURGERY    . SHOULDER SURGERY      There were no vitals filed for this visit.  Subjective Assessment - 09/24/18 1517    Subjective  Pt states that she didn't sleep well Sunday night. She states that her pain is no worse from her last session but not really any better.     Patient Stated Goals  get some relief    Currently in Pain?  Yes    Pain Score  5     Pain Location  Neck    Pain Orientation  Left    Pain Descriptors / Indicators  Tightness;Constant    Pain Type  Chronic pain    Pain Onset  More than a month ago    Pain Frequency  Constant    Aggravating Factors   movement    Pain Relieving Factors  alternating heat and ice    Effect of Pain on Daily Activities  severely impacts           [facility] Adult PT Treatment/Exercise - 09/24/18 0001      Modalities   Modalities  Moist Heat      Moist Heat Therapy   Number Minutes Moist Heat  8 Minutes    Moist Heat Location  Cervical   for blood flow to promote tissue healing,mm restriction,pain     Manual Therapy   Manual Therapy  Soft tissue mobilization;Taping    Manual therapy comments  completed seperately from all other skilled itnerventions    Soft tissue mobilization  STM after needling to L upper trap and levator scap to reduce restrictions and pain    Kinesiotex  Inhibit Muscle      Kinesiotix   Inhibit Muscle   2 Y-strips to L levator scap and upper trap to decrease pain and promote muscle relaxation       Trigger Point Dry Needling - 09/24/18 1524    Consent Given?  Yes    Education Handout Provided  No    Muscles Treated Upper Body  Upper trapezius;Levator scapulae    Upper Trapezius Response  Twitch reponse elicited;Palpable increased muscle length   L in prone and supine   Levator Scapulae Response  Twitch response elicited;Palpable increased muscle length  L in prone            PT Education - 09/24/18 1517    Education Details  continue HEP    Person(s) Educated  Patient    Methods  Explanation    Comprehension  Verbalized understanding       PT Short Term Goals - 09/18/18 1122      PT SHORT TERM GOAL #1   Title  Pt will be independent with HEP and perform consistently in order to improve ROM and decrease pain.    Time  2    Period  Weeks    Status  Achieved      PT SHORT TERM GOAL #2   Title  Pt will have improved cervical AROM by 5 deg throughout in order to decrease pain and improve overall function.    Baseline  10/9: see AROM    Time  2    Period  Weeks    Status  Partially Met      PT SHORT TERM GOAL #3   Title  Pt will score 14/50 or < on the NDI in order to demo mild self-perceived disability due to her neck pain.    Baseline  10/9: 10/50    Time  2    Period  Weeks    Status  Achieved        PT Long Term Goals - 09/18/18 1123      PT LONG TERM GOAL #1   Title  Pt will have improved cervical AROM by 10deg or > throughout in  order to further decrease pain and maximzie her ability to drive with greater ease.    Baseline  10/9: see AROM    Time  4    Period  Weeks    Status  Partially Met      PT LONG TERM GOAL #2   Title  Pt will report decreased neck pain to 3/10 or better intermittently in order to allow her to complete functional tasks with greater ease and promote return to PLOF.    Baseline  10/9: average daily pain has increased to 6/10 since last being seen    Time  4    Period  Weeks    Status  On-going      PT LONG TERM GOAL #3   Title  Pt will report being able to sleep through the night and awaken 2x or < during the night due to pain in order to maximize her recovery.    Baseline  10/9: pain is waking pt back up at night, multiple times in a night    Time  4    Period  Weeks    Status  On-going      PT LONG TERM GOAL #4   Title  Pt will have decreased cervical and periscapular musculature restrictions to moderate in order to maximize her ROM and decrease her pain.    Baseline  10/9: increased to max restrictions    Time  4    Period  Weeks    Status  On-going      PT LONG TERM GOAL #5   Title  Pt will be able to perform the Deep Neck Flexor Endurace Test for 30 sec with proper form and without pain in order to demo improved functional neck strength.    Baseline  10/9: 17.7sec    Time  4    Period  Weeks    Status  On-going  Plan - 09/24/18 1618    Clinical Impression Statement  Performed trigger point dry needling to pt's L upper trap and levator scap this date as she is still having increased pain L side of neck. Good twitch responses elicited throughout, mostly in upper trap. Followed up with manual STM and moist heat to increase blood flow to the area as well as promote relaxation so as to further decrease pain and mm restrictions. Applied 2 y-strips of rock tape to L upper trap and levator scap to inhibit mm/promote relaxation. Decreased pain to 2/10 at EOS, was 5/10 at  beginning. Continue as planned, progressing as able.     Rehab Potential  Fair    PT Frequency  2x / week    PT Duration  2 weeks    PT Treatment/Interventions  ADLs/Self Care Home Management;Biofeedback;Cryotherapy;Electrical Stimulation;Moist Heat;Ultrasound;DME Instruction;Functional mobility training;Therapeutic activities;Balance training;Neuromuscular re-education;Cognitive remediation;Patient/family education;Manual techniques;Scar mobilization;Passive range of motion;Dry needling;Energy conservation;Taping    PT Next Visit Plan  continue dry needling upper trap, k-tape again; continue with Manual for soft tissue restrictions for upper trap, levator scap and scalenes (caution wiht paraspinals due to osteoporosis); continue stretching, and postrual strenghtening; address proper computer mechanics with work and deep flexor endurance training.    PT Home Exercise Plan  eval: supine cervical retractions; 06/14/18: decompression exercise 1-5; 7/15: theraband decompression 1-4; 7/31: upper trap and levator scap stretch (did not provide handout for these)    Consulted and Agree with Plan of Care  Patient       Patient will benefit from skilled therapeutic intervention in order to improve the following deficits and impairments:  Decreased mobility, Decreased range of motion, Decreased strength, Hypomobility, Increased fascial restricitons, Increased muscle spasms, Impaired flexibility, Improper body mechanics, Postural dysfunction, Pain  Visit Diagnosis: Abnormal posture  Cervicalgia  Other symptoms and signs involving the musculoskeletal system     Problem List Patient Active Problem List   Diagnosis Date Noted  . Essential hypertension 07/23/2018  . Abdominal pain, left lower quadrant 03/07/2017  . Nausea and vomiting in adult 03/07/2017  . Rectal bleeding 03/07/2017  . Sciatica of left side 12/12/2013  . Osteoporosis 06/28/2011       Geraldine Solar PT, DPT  [facility] [address] [place], [name], [address] Phone: [phone]   Fax:  [phone]  Name: Shelly Stein MRN: [id number] Date of Birth: 30-Mar-1946

## 2018-10-02 ENCOUNTER — Ambulatory Visit (HOSPITAL_COMMUNITY): Payer: Medicare Other

## 2018-10-02 ENCOUNTER — Encounter (HOSPITAL_COMMUNITY): Payer: Self-pay

## 2018-10-02 DIAGNOSIS — R293 Abnormal posture: Secondary | ICD-10-CM

## 2018-10-02 DIAGNOSIS — M542 Cervicalgia: Secondary | ICD-10-CM | POA: Diagnosis not present

## 2018-10-02 DIAGNOSIS — R29898 Other symptoms and signs involving the musculoskeletal system: Secondary | ICD-10-CM

## 2018-10-02 NOTE — Therapy (Signed)
[facility] [place], [name], [address] Phone: [phone]   Fax:  [phone]  Physical Therapy Treatment  Patient Details  Name: Shelly Stein MRN: [id number] Date of Birth: 1946/05/14 Referring Provider (PT): Sallee Lange, MD   Encounter Date: 10/02/2018  PT End of Session - 10/02/18 1340    Visit Number  18    Number of Visits  25    Date for PT Re-Evaluation  10/16/18    Authorization Type  Medicare    Authorization Time Period  09/18/18 to 10/16/18    Authorization - Visit Number  18    Authorization - Number of Visits  25    PT Start Time  1340    PT Stop Time  1424    PT Time Calculation (min)  44 min    Activity Tolerance  Patient tolerated treatment well;No increased pain    Behavior During Therapy  WFL for tasks assessed/performed       Past Medical History:  Diagnosis Date  . Back pain   . Diverticulosis    mild    Past Surgical History:  Procedure Laterality Date  . ABDOMINAL HYSTERECTOMY    . BREAST BIOPSY Left 06/02/2016  . FOOT SURGERY    . SHOULDER SURGERY      There were no vitals filed for this visit.  Subjective Assessment - 10/02/18 1341    Subjective  Pt states that her neck felt more tight for the first 2-3 days after needling. The tape stayed on until Sunday. Yesterday and today have been good as far as pain; she's in about 3/10 pain at the moment.    Patient Stated Goals  get some relief    Currently in Pain?  Yes    Pain Score  3     Pain Location  Neck    Pain Orientation  Left    Pain Descriptors / Indicators  Tightness    Pain Type  Chronic pain    Pain Onset  More than a month ago    Pain Frequency  Constant    Aggravating Factors   movement    Pain Relieving Factors  alternating heat and ice    Effect of Pain on Daily Activities  severely impacts             [facility] Adult PT Treatment/Exercise - 10/02/18 0001      Manual Therapy   Manual Therapy  Soft tissue  mobilization;Taping    Manual therapy comments  completed seperately from all other skilled itnerventions    Soft tissue mobilization  STM after needling to L upper trap to reduce restrictions and pain    Kinesiotex  Inhibit Muscle      Kinesiotix   Inhibit Muscle   2 Y-strips to L levator scap and upper trap to decrease pain and promote muscle relaxation       Trigger Point Dry Needling - 10/02/18 1414    Consent Given?  Yes    Education Handout Provided  No    Muscles Treated Upper Body  Upper trapezius    Upper Trapezius Response  Twitch reponse elicited;Palpable increased muscle length   L in prone and supine          PT Education - 10/02/18 1340    Education Details  continue HEP    Person(s) Educated  Patient    Methods  Explanation;Demonstration    Comprehension  Verbalized understanding;Returned demonstration  PT Short Term Goals - 09/18/18 1122      PT SHORT TERM GOAL #1   Title  Pt will be independent with HEP and perform consistently in order to improve ROM and decrease pain.    Time  2    Period  Weeks    Status  Achieved      PT SHORT TERM GOAL #2   Title  Pt will have improved cervical AROM by 5 deg throughout in order to decrease pain and improve overall function.    Baseline  10/9: see AROM    Time  2    Period  Weeks    Status  Partially Met      PT SHORT TERM GOAL #3   Title  Pt will score 14/50 or < on the NDI in order to demo mild self-perceived disability due to her neck pain.    Baseline  10/9: 10/50    Time  2    Period  Weeks    Status  Achieved        PT Long Term Goals - 09/18/18 1123      PT LONG TERM GOAL #1   Title  Pt will have improved cervical AROM by 10deg or > throughout in order to further decrease pain and maximzie her ability to drive with greater ease.    Baseline  10/9: see AROM    Time  4    Period  Weeks    Status  Partially Met      PT LONG TERM GOAL #2   Title  Pt will report decreased neck pain to 3/10  or better intermittently in order to allow her to complete functional tasks with greater ease and promote return to PLOF.    Baseline  10/9: average daily pain has increased to 6/10 since last being seen    Time  4    Period  Weeks    Status  On-going      PT LONG TERM GOAL #3   Title  Pt will report being able to sleep through the night and awaken 2x or < during the night due to pain in order to maximize her recovery.    Baseline  10/9: pain is waking pt back up at night, multiple times in a night    Time  4    Period  Weeks    Status  On-going      PT LONG TERM GOAL #4   Title  Pt will have decreased cervical and periscapular musculature restrictions to moderate in order to maximize her ROM and decrease her pain.    Baseline  10/9: increased to max restrictions    Time  4    Period  Weeks    Status  On-going      PT LONG TERM GOAL #5   Title  Pt will be able to perform the Deep Neck Flexor Endurace Test for 30 sec with proper form and without pain in order to demo improved functional neck strength.    Baseline  10/9: 17.7sec    Time  4    Period  Weeks    Status  On-going            Plan - 10/02/18 1426    Clinical Impression Statement  Continued with established POC focusing on reducing soft tissue restrictions of L upper trap in order to decrease pt's neck pain. Pt continues with palpable knots and taut bans throughout upper trap, all of  which recreate her pain. Good twitch responses elicited throughout. Ended with manual STM for increased blood flow to the area in order to further reduce restrictions and promote mm recovery/healing. Applied k-tape again this date to continue to promote mm inhibition to decrease pain. EOS pain reduced to 2/10. Continue as planned, progressing as able.     Rehab Potential  Fair    PT Frequency  2x / week    PT Duration  2 weeks    PT Treatment/Interventions  ADLs/Self Care Home Management;Biofeedback;Cryotherapy;Electrical Stimulation;Moist  Heat;Ultrasound;DME Instruction;Functional mobility training;Therapeutic activities;Balance training;Neuromuscular re-education;Cognitive remediation;Patient/family education;Manual techniques;Scar mobilization;Passive range of motion;Dry needling;Energy conservation;Taping    PT Next Visit Plan  continue dry needling upper trap, k-tape again; continue with Manual for soft tissue restrictions for upper trap, levator scap and scalenes (caution wiht paraspinals due to osteoporosis); continue stretching, and postrual strenghtening; address proper computer mechanics with work and deep flexor endurance training.    PT Home Exercise Plan  eval: supine cervical retractions; 06/14/18: decompression exercise 1-5; 7/15: theraband decompression 1-4; 7/31: upper trap and levator scap stretch (did not provide handout for these)    Consulted and Agree with Plan of Care  Patient       Patient will benefit from skilled therapeutic intervention in order to improve the following deficits and impairments:  Decreased mobility, Decreased range of motion, Decreased strength, Hypomobility, Increased fascial restricitons, Increased muscle spasms, Impaired flexibility, Improper body mechanics, Postural dysfunction, Pain  Visit Diagnosis: Abnormal posture  Cervicalgia  Other symptoms and signs involving the musculoskeletal system     Problem List Patient Active Problem List   Diagnosis Date Noted  . Essential hypertension 07/23/2018  . Abdominal pain, left lower quadrant 03/07/2017  . Nausea and vomiting in adult 03/07/2017  . Rectal bleeding 03/07/2017  . Sciatica of left side 12/12/2013  . Osteoporosis 06/28/2011       Geraldine Solar PT, DPT  [facility] [address] [place], [name], [address] Phone: [phone]   Fax:  [phone]  Name: Shelly Stein MRN: [id number] Date of Birth: Jul 01, 1946

## 2018-10-07 ENCOUNTER — Encounter

## 2018-10-08 NOTE — Addendum Note (Signed)
Addended by: Geralyn Corwin on: 10/08/2018 04:25 PM   Modules accepted: Orders

## 2018-10-09 ENCOUNTER — Ambulatory Visit (HOSPITAL_COMMUNITY): Payer: Medicare Other

## 2018-10-09 ENCOUNTER — Encounter (HOSPITAL_COMMUNITY): Payer: Self-pay

## 2018-10-09 DIAGNOSIS — M542 Cervicalgia: Secondary | ICD-10-CM

## 2018-10-09 DIAGNOSIS — R29898 Other symptoms and signs involving the musculoskeletal system: Secondary | ICD-10-CM | POA: Diagnosis not present

## 2018-10-09 DIAGNOSIS — R293 Abnormal posture: Secondary | ICD-10-CM

## 2018-10-09 NOTE — Therapy (Signed)
[facility] [place], [name], [address] Phone: [phone]   Fax:  [phone]  Physical Therapy Treatment  Patient Details  Name: Shelly Stein MRN: [id number] Date of Birth: 03-12-1946 Referring Provider (PT): Sallee Lange, MD   Encounter Date: 10/09/2018  PT End of Session - 10/09/18 1425    Visit Number  19    Number of Visits  25    Date for PT Re-Evaluation  10/16/18    Authorization Type  Medicare    Authorization Time Period  09/18/18 to 10/16/18    Authorization - Visit Number  19    Authorization - Number of Visits  25    PT Start Time  3893    PT Stop Time  1424    PT Time Calculation (min)  39 min    Activity Tolerance  Patient tolerated treatment well;No increased pain    Behavior During Therapy  WFL for tasks assessed/performed       Past Medical History:  Diagnosis Date  . Back pain   . Diverticulosis    mild    Past Surgical History:  Procedure Laterality Date  . ABDOMINAL HYSTERECTOMY    . BREAST BIOPSY Left 06/02/2016  . FOOT SURGERY    . SHOULDER SURGERY      There were no vitals filed for this visit.  Subjective Assessment - 10/09/18 1346    Subjective  Pt reports that after needling she has soreness for a couple of days and then she loosens up and feels relatively good. Currently she is still tight but not too bad.     Patient Stated Goals  get some relief    Currently in Pain?  Yes    Pain Score  3     Pain Location  Neck    Pain Orientation  Left    Pain Descriptors / Indicators  Tightness    Pain Type  Chronic pain    Pain Onset  More than a month ago    Pain Frequency  Constant    Aggravating Factors   movement    Pain Relieving Factors  alternating heat and ice    Effect of Pain on Daily Activities  severely impacts         [facility] PT Assessment - 10/09/18 0001      AROM   Cervical - Right Rotation  66   after needling   Cervical - Left Rotation  69   after needling          [facility] Adult PT Treatment/Exercise - 10/09/18 0001      Moist Heat Therapy   Number Minutes Moist Heat  8 Minutes    Moist Heat Location  Cervical   for blood flow to promote tissue healing, mm restrictions, p     Manual Therapy   Manual Therapy  Soft tissue mobilization;Taping    Manual therapy comments  completed seperately from all other skilled itnerventions    Soft tissue mobilization  STM after needling to L upper trap to reduce restrictions and pain    Kinesiotex  Inhibit Muscle      Kinesiotix   Inhibit Muscle   2 Y-strips to L levator scap and upper trap to decrease pain and promote muscle relaxation       Trigger Point Dry Needling - 10/09/18 1353    Consent Given?  Yes    Education Handout Provided  No    Muscles Treated Upper Body  Upper trapezius    Upper Trapezius Response  Twitch reponse elicited;Palpable increased muscle length   L in prone and supine            PT Short Term Goals - 09/18/18 1122      PT SHORT TERM GOAL #1   Title  Pt will be independent with HEP and perform consistently in order to improve ROM and decrease pain.    Time  2    Period  Weeks    Status  Achieved      PT SHORT TERM GOAL #2   Title  Pt will have improved cervical AROM by 5 deg throughout in order to decrease pain and improve overall function.    Baseline  10/9: see AROM    Time  2    Period  Weeks    Status  Partially Met      PT SHORT TERM GOAL #3   Title  Pt will score 14/50 or < on the NDI in order to demo mild self-perceived disability due to her neck pain.    Baseline  10/9: 10/50    Time  2    Period  Weeks    Status  Achieved        PT Long Term Goals - 09/18/18 1123      PT LONG TERM GOAL #1   Title  Pt will have improved cervical AROM by 10deg or > throughout in order to further decrease pain and maximzie her ability to drive with greater ease.    Baseline  10/9: see AROM    Time  4    Period  Weeks    Status  Partially Met      PT  LONG TERM GOAL #2   Title  Pt will report decreased neck pain to 3/10 or better intermittently in order to allow her to complete functional tasks with greater ease and promote return to PLOF.    Baseline  10/9: average daily pain has increased to 6/10 since last being seen    Time  4    Period  Weeks    Status  On-going      PT LONG TERM GOAL #3   Title  Pt will report being able to sleep through the night and awaken 2x or < during the night due to pain in order to maximize her recovery.    Baseline  10/9: pain is waking pt back up at night, multiple times in a night    Time  4    Period  Weeks    Status  On-going      PT LONG TERM GOAL #4   Title  Pt will have decreased cervical and periscapular musculature restrictions to moderate in order to maximize her ROM and decrease her pain.    Baseline  10/9: increased to max restrictions    Time  4    Period  Weeks    Status  On-going      PT LONG TERM GOAL #5   Title  Pt will be able to perform the Deep Neck Flexor Endurace Test for 30 sec with proper form and without pain in order to demo improved functional neck strength.    Baseline  10/9: 17.7sec    Time  4    Period  Weeks    Status  On-going            Plan - 10/09/18 1426    Clinical Impression Statement  Overall,  pt progressing well in therapy, reporting decreased tightness on L side of neck. Continued with dry needling but decreased amount of needling performed since she stated she typically has 3-4days of soreness afterwards. Good twitch responses elicited throughout L upper trap. Followed up with manual STM and moist heat pack to the area to promote blood flow, tissue healing, and decreased pain. Reapplied k-tape for continued mm inhibition. Pt reported reduced pain to 1/10 at EOS. Continue as planned, progressing as able.     Rehab Potential  Fair    PT Frequency  2x / week    PT Duration  2 weeks    PT Treatment/Interventions  ADLs/Self Care Home  Management;Biofeedback;Cryotherapy;Electrical Stimulation;Moist Heat;Ultrasound;DME Instruction;Functional mobility training;Therapeutic activities;Balance training;Neuromuscular re-education;Cognitive remediation;Patient/family education;Manual techniques;Scar mobilization;Passive range of motion;Dry needling;Energy conservation;Taping    PT Next Visit Plan  update HEP; continue dry needling upper trap, k-tape again; continue with Manual for soft tissue restrictions for upper trap, levator scap and scalenes (caution wiht paraspinals due to osteoporosis); continue stretching, and postrual strenghtening; address proper computer mechanics with work and deep flexor endurance training.    PT Home Exercise Plan  eval: supine cervical retractions; 06/14/18: decompression exercise 1-5; 7/15: theraband decompression 1-4; 7/31: upper trap and levator scap stretch (did not provide handout for these)    Consulted and Agree with Plan of Care  Patient       Patient will benefit from skilled therapeutic intervention in order to improve the following deficits and impairments:  Decreased mobility, Decreased range of motion, Decreased strength, Hypomobility, Increased fascial restricitons, Increased muscle spasms, Impaired flexibility, Improper body mechanics, Postural dysfunction, Pain  Visit Diagnosis: Abnormal posture  Cervicalgia  Other symptoms and signs involving the musculoskeletal system     Problem List Patient Active Problem List   Diagnosis Date Noted  . Essential hypertension 07/23/2018  . Abdominal pain, left lower quadrant 03/07/2017  . Nausea and vomiting in adult 03/07/2017  . Rectal bleeding 03/07/2017  . Sciatica of left side 12/12/2013  . Osteoporosis 06/28/2011       Geraldine Solar PT, DPT  [facility] [address] [place], [name], [address] Phone: [phone]   Fax:  [phone]  Name: JADALYNN BURR MRN: [id number] Date of Birth:  03/15/1946

## 2018-10-11 ENCOUNTER — Encounter (HOSPITAL_COMMUNITY): Payer: Self-pay

## 2018-10-11 ENCOUNTER — Ambulatory Visit (HOSPITAL_COMMUNITY): Payer: Medicare Other | Attending: Family Medicine

## 2018-10-11 DIAGNOSIS — R29898 Other symptoms and signs involving the musculoskeletal system: Secondary | ICD-10-CM | POA: Diagnosis not present

## 2018-10-11 DIAGNOSIS — R293 Abnormal posture: Secondary | ICD-10-CM

## 2018-10-11 DIAGNOSIS — M542 Cervicalgia: Secondary | ICD-10-CM

## 2018-10-11 NOTE — Therapy (Signed)
[place], [name], [address] Phone: [phone]   Fax:  [phone]  Physical Therapy Treatment  Patient Details  Name: Shelly Stein MRN: [id number] Date of Birth: [birth year] Referring Provider (PT): Sallee Lange, MD   Encounter Date: 10/11/2018  PT End of Session - 10/11/18 1031    Visit Number  20    Number of Visits  25    Date for PT Re-Evaluation  10/16/18    Authorization Type  Medicare    Authorization Time Period  09/18/18 to 10/16/18    Authorization - Visit Number  20    Authorization - Number of Visits  25    PT Start Time  1660    PT Stop Time  1109    PT Time Calculation (min)  41 min    Activity Tolerance  Patient tolerated treatment well;No increased pain    Behavior During Therapy  WFL for tasks assessed/performed       Past Medical History:  Diagnosis Date  . Back pain   . Diverticulosis    mild    Past Surgical History:  Procedure Laterality Date  . ABDOMINAL HYSTERECTOMY    . BREAST BIOPSY Left 06/02/2016  . FOOT SURGERY    . SHOULDER SURGERY      There were no vitals filed for this visit.  Subjective Assessment - 10/11/18 1032    Subjective  Pt states that she was not as sore following the needling this last time. Still about a 3/10.     Patient Stated Goals  get some relief    Currently in Pain?  Yes    Pain Score  3     Pain Location  Neck    Pain Orientation  Left    Pain Descriptors / Indicators  Tightness    Pain Type  Chronic pain    Pain Onset  More than a month ago    Pain Frequency  Constant    Aggravating Factors   movement    Pain Relieving Factors  alternating heat and ice    Effect of Pain on Daily Activities  severely impacts           [facility] Adult PT Treatment/Exercise - 10/11/18 0001      Exercises   Exercises  Neck      Neck Exercises: Theraband   Shoulder Extension  10 reps    Shoulder Extension Limitations  +chin tuck, 2 sets    Rows  10  reps;Red    Rows Limitations  +chin tuck, 2 sets      Neck Exercises: Standing   Wall Push Ups  10 reps    Wall Push Ups Limitations  2 sets    Upper Extremity D2  Flexion;10 reps;Theraband    Theraband Level (UE D2)  Level 2 (Red)    UE D2 Weights (lbs)  mod cues for form    Other Standing Exercises  y's wall liftoff 2x10 +chin tuck    Other Standing Exercises  body blade 3x10" vert and horiz perturbations elbow bent      Neck Exercises: Prone   Neck Retraction  10 reps;3 secs    Neck Retraction Limitations  chin tuck, head lift      Manual Therapy   Manual Therapy  Soft tissue mobilization    Manual therapy comments  completed seperately from all other skilled itnerventions    Soft tissue mobilization  STM to L proximal and medial  upper trap tand levator scap to reduce restrictions and pain           PT Education - 10/11/18 1031    Education Details  exercise technique, updated HEP    Person(s) Educated  Patient    Methods  Explanation;Demonstration    Comprehension  Verbalized understanding;Returned demonstration       PT Short Term Goals - 09/18/18 1122      PT SHORT TERM GOAL #1   Title  Pt will be independent with HEP and perform consistently in order to improve ROM and decrease pain.    Time  2    Period  Weeks    Status  Achieved      PT SHORT TERM GOAL #2   Title  Pt will have improved cervical AROM by 5 deg throughout in order to decrease pain and improve overall function.    Baseline  10/9: see AROM    Time  2    Period  Weeks    Status  Partially Met      PT SHORT TERM GOAL #3   Title  Pt will score 14/50 or < on the NDI in order to demo mild self-perceived disability due to her neck pain.    Baseline  10/9: 10/50    Time  2    Period  Weeks    Status  Achieved        PT Long Term Goals - 09/18/18 1123      PT LONG TERM GOAL #1   Title  Pt will have improved cervical AROM by 10deg or > throughout in order to further decrease pain and maximzie  her ability to drive with greater ease.    Baseline  10/9: see AROM    Time  4    Period  Weeks    Status  Partially Met      PT LONG TERM GOAL #2   Title  Pt will report decreased neck pain to 3/10 or better intermittently in order to allow her to complete functional tasks with greater ease and promote return to PLOF.    Baseline  10/9: average daily pain has increased to 6/10 since last being seen    Time  4    Period  Weeks    Status  On-going      PT LONG TERM GOAL #3   Title  Pt will report being able to sleep through the night and awaken 2x or < during the night due to pain in order to maximize her recovery.    Baseline  10/9: pain is waking pt back up at night, multiple times in a night    Time  4    Period  Weeks    Status  On-going      PT LONG TERM GOAL #4   Title  Pt will have decreased cervical and periscapular musculature restrictions to moderate in order to maximize her ROM and decrease her pain.    Baseline  10/9: increased to max restrictions    Time  4    Period  Weeks    Status  On-going      PT LONG TERM GOAL #5   Title  Pt will be able to perform the Deep Neck Flexor Endurace Test for 30 sec with proper form and without pain in order to demo improved functional neck strength.    Baseline  10/9: 17.7sec    Time  4    Period  Weeks  Status  On-going            Plan - 10/11/18 1110    Clinical Impression Statement  Began session with postural strengthening and cervical stability exercises. Min cues for technique but no L neck pain reported during session; she felt it appropriately in the mm groups targeted. Removed k-tape and performed STM to L upper trap and levator scap to reduce restrictions to end the session. Decreased pain to 2/10 at EOS. Did not reapply k-tap to assess response.     Rehab Potential  Fair    PT Frequency  2x / week    PT Duration  2 weeks    PT Treatment/Interventions  ADLs/Self Care Home  Management;Biofeedback;Cryotherapy;Electrical Stimulation;Moist Heat;Ultrasound;DME Instruction;Functional mobility training;Therapeutic activities;Balance training;Neuromuscular re-education;Cognitive remediation;Patient/family education;Manual techniques;Scar mobilization;Passive range of motion;Dry needling;Energy conservation;Taping    PT Next Visit Plan  continue dry needling upper trap, k-tape again; continue with Manual for soft tissue restrictions for upper trap, levator scap and scalenes (caution wiht paraspinals due to osteoporosis); continue stretching, and postrual strenghtening; address proper computer mechanics with work and deep flexor endurance training.    PT Home Exercise Plan  eval: supine cervical retractions; 06/14/18: decompression exercise 1-5; 7/15: theraband decompression 1-4; 7/31: upper trap and levator scap stretch (did not provide handout for these); 11/1: scap retraction and GH ext RTB    Consulted and Agree with Plan of Care  Patient       Patient will benefit from skilled therapeutic intervention in order to improve the following deficits and impairments:  Decreased mobility, Decreased range of motion, Decreased strength, Hypomobility, Increased fascial restricitons, Increased muscle spasms, Impaired flexibility, Improper body mechanics, Postural dysfunction, Pain  Visit Diagnosis: Abnormal posture  Cervicalgia  Other symptoms and signs involving the musculoskeletal system     Problem List Patient Active Problem List   Diagnosis Date Noted  . Essential hypertension 07/23/2018  . Abdominal pain, left lower quadrant 03/07/2017  . Nausea and vomiting in adult 03/07/2017  . Rectal bleeding 03/07/2017  . Sciatica of left side 12/12/2013  . Osteoporosis 06/28/2011         Geraldine Solar PT, DPT  [facility] [address] [place], [name], [address] Phone: [phone]   Fax:  [phone]  Name: Shelly Stein MRN: [id number] Date of Birth: Jun 04, 1946

## 2018-10-14 ENCOUNTER — Ambulatory Visit (HOSPITAL_COMMUNITY): Payer: Medicare Other

## 2018-10-14 ENCOUNTER — Encounter (HOSPITAL_COMMUNITY): Payer: Self-pay

## 2018-10-14 DIAGNOSIS — R293 Abnormal posture: Secondary | ICD-10-CM

## 2018-10-14 DIAGNOSIS — M542 Cervicalgia: Secondary | ICD-10-CM | POA: Diagnosis not present

## 2018-10-14 DIAGNOSIS — R29898 Other symptoms and signs involving the musculoskeletal system: Secondary | ICD-10-CM | POA: Diagnosis not present

## 2018-10-14 NOTE — Therapy (Signed)
[facility] [place], [name], [address] Phone: [phone]   Fax:  [phone]  Physical Therapy Treatment  Patient Details  Name: Shelly Stein MRN: [id number] Date of Birth: 1946/01/09 Referring Provider (PT): Sallee Lange, MD   Encounter Date: 10/14/2018  PT End of Session - 10/14/18 0855    Visit Number  21    Number of Visits  25    Date for PT Re-Evaluation  10/16/18    Authorization Type  Medicare    Authorization Time Period  09/18/18 to 10/16/18    Authorization - Visit Number  21    Authorization - Number of Visits  25    PT Start Time  2774    PT Stop Time  0935    PT Time Calculation (min)  40 min    Activity Tolerance  Patient tolerated treatment well;No increased pain    Behavior During Therapy  WFL for tasks assessed/performed       Past Medical History:  Diagnosis Date  . Back pain   . Diverticulosis    mild    Past Surgical History:  Procedure Laterality Date  . ABDOMINAL HYSTERECTOMY    . BREAST BIOPSY Left 06/02/2016  . FOOT SURGERY    . SHOULDER SURGERY      There were no vitals filed for this visit.  Subjective Assessment - 10/14/18 0857    Subjective  Pt states that her neck is still about a 2/10. She felt good following the last session.     Patient Stated Goals  get some relief    Currently in Pain?  Yes    Pain Score  2     Pain Location  Neck    Pain Orientation  Left    Pain Descriptors / Indicators  Tightness    Pain Type  Chronic pain    Pain Onset  More than a month ago    Pain Frequency  Constant    Aggravating Factors   movement    Pain Relieving Factors  alternating heat and ice    Effect of Pain on Daily Activities  severely impacts            [facility] Adult PT Treatment/Exercise - 10/14/18 0001      Exercises   Exercises  Neck      Neck Exercises: Seated   W Back  15 reps    Other Seated Exercise  3D thoracic excursions x10 reps each      Manual Therapy    Manual Therapy  Soft tissue mobilization    Manual therapy comments  completed seperately from all other skilled itnerventions    Soft tissue mobilization  STM after needling to all of L upper and middle trap and levator scap to reduce restrictions and pain      Neck Exercises: Stretches   Upper Trapezius Stretch  Right;Left;2 reps;30 seconds       Trigger Point Dry Needling - 10/14/18 0858    Consent Given?  Yes    Education Handout Provided  No    Muscles Treated Upper Body  Upper trapezius    Upper Trapezius Response  Twitch reponse elicited;Palpable increased muscle length   L in prone            PT Education - 10/14/18 0933    Education Details  will reassess next visit    Person(s) Educated  Patient    Methods  Explanation;Demonstration    Comprehension  Verbalized understanding;Returned demonstration       PT Short Term Goals - 09/18/18 1122      PT SHORT TERM GOAL #1   Title  Pt will be independent with HEP and perform consistently in order to improve ROM and decrease pain.    Time  2    Period  Weeks    Status  Achieved      PT SHORT TERM GOAL #2   Title  Pt will have improved cervical AROM by 5 deg throughout in order to decrease pain and improve overall function.    Baseline  10/9: see AROM    Time  2    Period  Weeks    Status  Partially Met      PT SHORT TERM GOAL #3   Title  Pt will score 14/50 or < on the NDI in order to demo mild self-perceived disability due to her neck pain.    Baseline  10/9: 10/50    Time  2    Period  Weeks    Status  Achieved        PT Long Term Goals - 09/18/18 1123      PT LONG TERM GOAL #1   Title  Pt will have improved cervical AROM by 10deg or > throughout in order to further decrease pain and maximzie her ability to drive with greater ease.    Baseline  10/9: see AROM    Time  4    Period  Weeks    Status  Partially Met      PT LONG TERM GOAL #2   Title  Pt will report decreased neck pain to 3/10 or  better intermittently in order to allow her to complete functional tasks with greater ease and promote return to PLOF.    Baseline  10/9: average daily pain has increased to 6/10 since last being seen    Time  4    Period  Weeks    Status  On-going      PT LONG TERM GOAL #3   Title  Pt will report being able to sleep through the night and awaken 2x or < during the night due to pain in order to maximize her recovery.    Baseline  10/9: pain is waking pt back up at night, multiple times in a night    Time  4    Period  Weeks    Status  On-going      PT LONG TERM GOAL #4   Title  Pt will have decreased cervical and periscapular musculature restrictions to moderate in order to maximize her ROM and decrease her pain.    Baseline  10/9: increased to max restrictions    Time  4    Period  Weeks    Status  On-going      PT LONG TERM GOAL #5   Title  Pt will be able to perform the Deep Neck Flexor Endurace Test for 30 sec with proper form and without pain in order to demo improved functional neck strength.    Baseline  10/9: 17.7sec    Time  4    Period  Weeks    Status  On-going            Plan - 10/14/18 0935    Clinical Impression Statement  Continued with dry needling this date to address restrictions remaining in L upper trap. Good twitch responses elicited throughout, mostly in lateral portion of upper  trap. Followed up with manual STM to promote blood flow for reduced restrictions and pain. Ended with mm stretching, activation, and thoracic mobility in order to reduce overall pain. Pt reporting 1/10 pain at EOS. Pt due for reassessment next visit.     Rehab Potential  Fair    PT Frequency  2x / week    PT Duration  2 weeks    PT Treatment/Interventions  ADLs/Self Care Home Management;Biofeedback;Cryotherapy;Electrical Stimulation;Moist Heat;Ultrasound;DME Instruction;Functional mobility training;Therapeutic activities;Balance training;Neuromuscular re-education;Cognitive  remediation;Patient/family education;Manual techniques;Scar mobilization;Passive range of motion;Dry needling;Energy conservation;Taping    PT Next Visit Plan  reassessment; continue dry needling upper trap, k-tape PRN; continue with Manual for soft tissue restrictions for upper trap, levator scap and scalenes (caution wiht paraspinals due to osteoporosis); continue stretching, and postrual strenghtening; address proper computer mechanics with work and deep flexor endurance training.    PT Home Exercise Plan  eval: supine cervical retractions; 06/14/18: decompression exercise 1-5; 7/15: theraband decompression 1-4; 7/31: upper trap and levator scap stretch (did not provide handout for these); 11/1: scap retraction and GH ext RTB    Consulted and Agree with Plan of Care  Patient       Patient will benefit from skilled therapeutic intervention in order to improve the following deficits and impairments:  Decreased mobility, Decreased range of motion, Decreased strength, Hypomobility, Increased fascial restricitons, Increased muscle spasms, Impaired flexibility, Improper body mechanics, Postural dysfunction, Pain  Visit Diagnosis: Abnormal posture  Cervicalgia  Other symptoms and signs involving the musculoskeletal system     Problem List Patient Active Problem List   Diagnosis Date Noted  . Essential hypertension 07/23/2018  . Abdominal pain, left lower quadrant 03/07/2017  . Nausea and vomiting in adult 03/07/2017  . Rectal bleeding 03/07/2017  . Sciatica of left side 12/12/2013  . Osteoporosis 06/28/2011        Geraldine Solar PT, DPT  [facility] [address] [place], [name], [address] Phone: [phone]   Fax:  [phone]  Name: SALIHAH PECKHAM MRN: [id number] Date of Birth: December 17, 1945

## 2018-10-17 ENCOUNTER — Encounter (HOSPITAL_COMMUNITY): Payer: Self-pay

## 2018-10-17 ENCOUNTER — Ambulatory Visit (HOSPITAL_COMMUNITY): Payer: Medicare Other

## 2018-10-17 DIAGNOSIS — M542 Cervicalgia: Secondary | ICD-10-CM | POA: Diagnosis not present

## 2018-10-17 DIAGNOSIS — R29898 Other symptoms and signs involving the musculoskeletal system: Secondary | ICD-10-CM

## 2018-10-17 DIAGNOSIS — R293 Abnormal posture: Secondary | ICD-10-CM | POA: Diagnosis not present

## 2018-10-17 NOTE — Therapy (Signed)
[facility] [place], [name], [address] Phone: [phone]   Fax:  [phone]   Progress Note Reporting Period 09/18/18 to 10/17/18  See note below for Objective Data and Assessment of Progress/Goals.    Physical Therapy Treatment  Patient Details  Name: Shelly Stein MRN: [id number] Date of Birth: November 14, 1946 Referring Provider (PT): Sallee Lange, MD   Encounter Date: 10/17/2018  PT End of Session - 10/17/18 1022    Visit Number  22    Number of Visits  33    Date for PT Re-Evaluation  11/14/18    Authorization Type  Medicare    Authorization Time Period  09/18/18 to 10/16/18; NEW: 10/17/18 to 11/14/18    Authorization - Visit Number  22    Authorization - Number of Visits  33    PT Start Time  1020    PT Stop Time  1100    PT Time Calculation (min)  40 min    Activity Tolerance  Patient tolerated treatment well;No increased pain    Behavior During Therapy  WFL for tasks assessed/performed       Past Medical History:  Diagnosis Date  . Back pain   . Diverticulosis    mild    Past Surgical History:  Procedure Laterality Date  . ABDOMINAL HYSTERECTOMY    . BREAST BIOPSY Left 06/02/2016  . FOOT SURGERY    . SHOULDER SURGERY      There were no vitals filed for this visit.  Subjective Assessment - 10/17/18 1023    Subjective  Pt reports that her neck is stiff but the pain has reminained around a 2-2.5/10 over the last couple of days. Did well after needling.    Patient Stated Goals  get some relief    Currently in Pain?  Yes    Pain Score  2     Pain Location  Neck    Pain Orientation  Left    Pain Descriptors / Indicators  Tightness    Pain Type  Chronic pain    Pain Onset  More than a month ago    Pain Frequency  Constant    Aggravating Factors   movement    Pain Relieving Factors  alternating heat and ice    Effect of Pain on Daily Activities  severely impacts         [facility] PT Assessment -  10/17/18 0001      Assessment   Medical Diagnosis  Neck pain    Referring Provider (PT)  Sallee Lange, MD    Next MD Visit  no f/u appt with Dr. Wolfgang Phoenix as of yet - mainly following her for Boniva injections for osteoporosis   Glenna Fellows, MD  her neurosurgeon in February 2020     Other:   Other/ Comments  DNFE test: 26.2sec   was 17.7sec     AROM   Cervical Flexion  46   was 37   Cervical Extension  46   was 48   Cervical - Right Side Bend  26   was 30   Cervical - Left Side Bend  32   was 22   Cervical - Right Rotation  71   was 65   Cervical - Left Rotation  74   was 51     Palpation   Palpation comment  moderate   was max restrictions         [facility] Adult PT Treatment/Exercise - 10/17/18  0001      Exercises   Exercises  Neck      Neck Exercises: Machines for Strengthening   UBE (Upper Arm Bike)  x45mns retro, L3, for postural strengthening      Manual Therapy   Manual Therapy  Soft tissue mobilization    Manual therapy comments  completed seperately from all other skilled itnerventions    Soft tissue mobilization  STM to L upper trap levator scap and cervical paraspinals to reduce restrictions and pain            PT Education - 10/17/18 1023    Education Details  reassessment findings    Person(s) Educated  Patient    Methods  Explanation    Comprehension  Verbalized understanding       PT Short Term Goals - 10/17/18 1024      PT SHORT TERM GOAL #1   Title  Pt will be independent with HEP and perform consistently in order to improve ROM and decrease pain.    Time  2    Period  Weeks    Status  Achieved      PT SHORT TERM GOAL #2   Title  Pt will have improved cervical AROM by 5 deg throughout in order to decrease pain and improve overall function.    Baseline  10/9: see AROM    Time  2    Period  Weeks    Status  Partially Met      PT SHORT TERM GOAL #3   Title  Pt will score 14/50 or < on the NDI in order to demo mild self-perceived  disability due to her neck pain.    Baseline  10/9: 10/50    Time  2    Period  Weeks    Status  Achieved        PT Long Term Goals - 10/17/18 1024      PT LONG TERM GOAL #1   Title  Pt will have improved cervical AROM to 80deg for bil rotation to demo furhter reduced soft tissue restrictions and continue to maximzie her ability to drive with greater ease.    Baseline  11/7: see AROM (revised goal 10/17/18)    Time  4    Period  Weeks    Status  Revised    Target Date  11/14/18      PT LONG TERM GOAL #2   Title  Pt will report decreased neck pain to 1/10 or better intermittently in order to allow her to complete functional tasks with greater ease and promote return to PLOF.    Baseline  11/7: no more than a 2/10 on a daily basis and is now intermittent (revised goal to state 1/10; was 3/10)    Time  4    Period  Weeks    Status  Revised    Target Date  11/14/18      PT LONG TERM GOAL #3   Title  Pt will report being able to sleep through the night and awaken 2x or < during the night due to pain in order to maximize her recovery.    Baseline  11/7: sleep is much better maybe wakes up 1x/night but not every night    Time  4    Period  Weeks    Status  Achieved      PT LONG TERM GOAL #4   Title  Pt will have decreased cervical and periscapular musculature restrictions to  minimal in order to maximize her ROM and decrease her pain.    Baseline  11/7: reduced to moderate (revised goal 10/17/18; was moderate)    Time  4    Period  Weeks    Status  Revised    Target Date  11/14/18      PT LONG TERM GOAL #5   Title  Pt will be able to perform the Deep Neck Flexor Endurace Test for 30 sec with proper form and without pain in order to demo improved functional neck strength.    Baseline  11/7: 26.2sec    Time  4    Period  Weeks    Status  On-going            Plan - 10/17/18 1104    Clinical Impression Statement  PT reassessed pt's goals and outcome measures this date.  Since her initial eval in June and re-eval in early October (after her 4-weeks out due to familial commitments), she has made great progress towards goals. Her cervical AROM has significantly improved, especially in rotation and lateral flexion bilaterally, and her cervical strength has improved AEB increased hold time during DNFE test. Pt's average daily pain has reduced to a 2/10 and has been maintained at this point for the last 2 weeks. She continues with restrictions in upper trap and periscapular mm and still has pain/pulling with ROM. She has clearly responded very well to dry needling as her restrictions and pain are steadily improving PT feels pt needs brief extension of therapy services in order to continue to address her remaining deficits and further reduce pain and improve QOL. Pt recommends 2x/week for 4 more weeks to continue dry needling, cervical strengthening and stability and other manual techniques to continue her progress. Goals revised this date for pt to continue to progress towards.    Clinical Presentation  Stable    Clinical Decision Making  Low    Rehab Potential  Fair    PT Frequency  2x / week    PT Duration  4 weeks    PT Treatment/Interventions  ADLs/Self Care Home Management;Biofeedback;Cryotherapy;Electrical Stimulation;Moist Heat;Ultrasound;DME Instruction;Functional mobility training;Therapeutic activities;Balance training;Neuromuscular re-education;Cognitive remediation;Patient/family education;Manual techniques;Scar mobilization;Passive range of motion;Dry needling;Energy conservation;Taping    PT Next Visit Plan  continue dry needling upper trap, k-tape PRN; continue with Manual for soft tissue restrictions for upper trap, levator scap and scalenes (caution wiht paraspinals due to osteoporosis); continue stretching, and postrual strenghtening; address proper computer mechanics with work and deep flexor endurance training.    PT Home Exercise Plan  eval: supine cervical  retractions; 06/14/18: decompression exercise 1-5; 7/15: theraband decompression 1-4; 7/31: upper trap and levator scap stretch (did not provide handout for these); 11/1: scap retraction and GH ext RTB    Consulted and Agree with Plan of Care  Patient       Patient will benefit from skilled therapeutic intervention in order to improve the following deficits and impairments:  Decreased mobility, Decreased range of motion, Decreased strength, Hypomobility, Increased fascial restricitons, Increased muscle spasms, Impaired flexibility, Improper body mechanics, Postural dysfunction, Pain  Visit Diagnosis: Abnormal posture - Plan: PT plan of care cert/re-cert  Cervicalgia - Plan: PT plan of care cert/re-cert  Other symptoms and signs involving the musculoskeletal system - Plan: PT plan of care cert/re-cert     Problem List Patient Active Problem List   Diagnosis Date Noted  . Essential hypertension 07/23/2018  . Abdominal pain, left lower quadrant 03/07/2017  . Nausea and  vomiting in adult 03/07/2017  . Rectal bleeding 03/07/2017  . Sciatica of left side 12/12/2013  . Osteoporosis 06/28/2011        Geraldine Solar PT, DPT  [facility] [address] [place], [name], [address] Phone: [phone]   Fax:  [phone]  Name: HAJA CREGO MRN: [id number] Date of Birth: [birth year]

## 2018-10-21 ENCOUNTER — Encounter (HOSPITAL_COMMUNITY): Payer: Self-pay

## 2018-10-21 ENCOUNTER — Ambulatory Visit (HOSPITAL_COMMUNITY): Payer: Medicare Other

## 2018-10-21 DIAGNOSIS — R293 Abnormal posture: Secondary | ICD-10-CM

## 2018-10-21 DIAGNOSIS — R29898 Other symptoms and signs involving the musculoskeletal system: Secondary | ICD-10-CM | POA: Diagnosis not present

## 2018-10-21 DIAGNOSIS — M542 Cervicalgia: Secondary | ICD-10-CM | POA: Diagnosis not present

## 2018-10-21 NOTE — Therapy (Signed)
[facility] [place], [name], [address] Phone: [phone]   Fax:  [phone]  Physical Therapy Treatment  Patient Details  Name: Shelly Stein MRN: [id number] Date of Birth: 1946-11-17 Referring Provider (PT): Sallee Lange, MD   Encounter Date: 10/21/2018  PT End of Session - 10/21/18 1034    Visit Number  23    Number of Visits  33    Date for PT Re-Evaluation  11/14/18    Authorization Type  Medicare    Authorization Time Period  09/18/18 to 10/16/18; NEW: 10/17/18 to 11/14/18    Authorization - Visit Number  23    Authorization - Number of Visits  33    PT Start Time  1031    PT Stop Time  1112    PT Time Calculation (min)  41 min    Activity Tolerance  Patient tolerated treatment well;No increased pain    Behavior During Therapy  WFL for tasks assessed/performed       Past Medical History:  Diagnosis Date  . Back pain   . Diverticulosis    mild    Past Surgical History:  Procedure Laterality Date  . ABDOMINAL HYSTERECTOMY    . BREAST BIOPSY Left 06/02/2016  . FOOT SURGERY    . SHOULDER SURGERY      There were no vitals filed for this visit.  Subjective Assessment - 10/21/18 1035    Subjective  Pt states that her neck pain is maintaining at a 2/10. She did well with her pain over the weekend.    Patient Stated Goals  get some relief    Currently in Pain?  Yes    Pain Score  2     Pain Location  Neck    Pain Orientation  Left    Pain Descriptors / Indicators  Tightness    Pain Type  Chronic pain    Pain Onset  More than a month ago    Pain Frequency  Constant    Aggravating Factors   movement    Pain Relieving Factors  alternating heat and ice    Effect of Pain on Daily Activities  severly impacts           [facility] Adult PT Treatment/Exercise - 10/21/18 0001      Neck Exercises: Standing   Other Standing Exercises  y's wall liftoff x10 +chin tuck      Neck Exercises: Seated   W Back  15  reps    Other Seated Exercise  3D thoracic excursions x10 reps each      Manual Therapy   Manual Therapy  Soft tissue mobilization    Manual therapy comments  completed seperately from all other skilled itnerventions    Soft tissue mobilization  STM to L upper trap levator scap and cervical paraspinals to reduce restrictions and pain       Trigger Point Dry Needling - 10/21/18 1036    Consent Given?  Yes    Education Handout Provided  No    Muscles Treated Upper Body  Upper trapezius    Upper Trapezius Response  Twitch reponse elicited;Palpable increased muscle length   L in prone            PT Short Term Goals - 10/17/18 1024      PT SHORT TERM GOAL #1   Title  Pt will be independent with HEP and perform consistently in order to improve ROM and decrease pain.  Time  2    Period  Weeks    Status  Achieved      PT SHORT TERM GOAL #2   Title  Pt will have improved cervical AROM by 5 deg throughout in order to decrease pain and improve overall function.    Baseline  10/9: see AROM    Time  2    Period  Weeks    Status  Partially Met      PT SHORT TERM GOAL #3   Title  Pt will score 14/50 or < on the NDI in order to demo mild self-perceived disability due to her neck pain.    Baseline  10/9: 10/50    Time  2    Period  Weeks    Status  Achieved        PT Long Term Goals - 10/17/18 1024      PT LONG TERM GOAL #1   Title  Pt will have improved cervical AROM to 80deg for bil rotation to demo furhter reduced soft tissue restrictions and continue to maximzie her ability to drive with greater ease.    Baseline  11/7: see AROM (revised goal 10/17/18)    Time  4    Period  Weeks    Status  Revised    Target Date  11/14/18      PT LONG TERM GOAL #2   Title  Pt will report decreased neck pain to 1/10 or better intermittently in order to allow her to complete functional tasks with greater ease and promote return to PLOF.    Baseline  11/7: no more than a 2/10 on a daily  basis and is now intermittent (revised goal to state 1/10; was 3/10)    Time  4    Period  Weeks    Status  Revised    Target Date  11/14/18      PT LONG TERM GOAL #3   Title  Pt will report being able to sleep through the night and awaken 2x or < during the night due to pain in order to maximize her recovery.    Baseline  11/7: sleep is much better maybe wakes up 1x/night but not every night    Time  4    Period  Weeks    Status  Achieved      PT LONG TERM GOAL #4   Title  Pt will have decreased cervical and periscapular musculature restrictions to minimal in order to maximize her ROM and decrease her pain.    Baseline  11/7: reduced to moderate (revised goal 10/17/18; was moderate)    Time  4    Period  Weeks    Status  Revised    Target Date  11/14/18      PT LONG TERM GOAL #5   Title  Pt will be able to perform the Deep Neck Flexor Endurace Test for 30 sec with proper form and without pain in order to demo improved functional neck strength.    Baseline  11/7: 26.2sec    Time  4    Period  Weeks    Status  On-going            Plan - 10/21/18 1111    Clinical Impression Statement  Continued with dry needling this date to address remaining taut bands in L upper trap. Good twitch responses elicited throughout, mostly noted to be in anterior upper trap. Followed up with manual STM to mm needled in  order to further promote mm relaxation, reduced restrictions, and reduced pain. Ended with postural strengthening and mm activation. Reduced pain to 1/10 at EOS. Continue as planned, progressing as able.     Rehab Potential  Fair    PT Frequency  2x / week    PT Duration  4 weeks    PT Treatment/Interventions  ADLs/Self Care Home Management;Biofeedback;Cryotherapy;Electrical Stimulation;Moist Heat;Ultrasound;DME Instruction;Functional mobility training;Therapeutic activities;Balance training;Neuromuscular re-education;Cognitive remediation;Patient/family education;Manual  techniques;Scar mobilization;Passive range of motion;Dry needling;Energy conservation;Taping    PT Next Visit Plan  continue dry needling upper trap, k-tape PRN; continue with Manual for soft tissue restrictions for upper trap, levator scap and scalenes (caution wiht paraspinals due to osteoporosis); continue stretching, and postrual strenghtening; address proper computer mechanics with work and deep flexor endurance training.    PT Home Exercise Plan  eval: supine cervical retractions; 06/14/18: decompression exercise 1-5; 7/15: theraband decompression 1-4; 7/31: upper trap and levator scap stretch (did not provide handout for these); 11/1: scap retraction and GH ext RTB    Consulted and Agree with Plan of Care  Patient       Patient will benefit from skilled therapeutic intervention in order to improve the following deficits and impairments:  Decreased mobility, Decreased range of motion, Decreased strength, Hypomobility, Increased fascial restricitons, Increased muscle spasms, Impaired flexibility, Improper body mechanics, Postural dysfunction, Pain  Visit Diagnosis: Abnormal posture  Cervicalgia  Other symptoms and signs involving the musculoskeletal system     Problem List Patient Active Problem List   Diagnosis Date Noted  . Essential hypertension 07/23/2018  . Abdominal pain, left lower quadrant 03/07/2017  . Nausea and vomiting in adult 03/07/2017  . Rectal bleeding 03/07/2017  . Sciatica of left side 12/12/2013  . Osteoporosis 06/28/2011        Geraldine Solar PT, DPT  [facility] [address] [place], [name], [address] Phone: [phone]   Fax:  [phone]  Name: Shelly Stein MRN: [id number] Date of Birth: 12-16-1945

## 2018-10-24 ENCOUNTER — Encounter (HOSPITAL_COMMUNITY): Payer: Self-pay

## 2018-10-24 ENCOUNTER — Ambulatory Visit (HOSPITAL_COMMUNITY): Payer: Medicare Other

## 2018-10-24 DIAGNOSIS — R29898 Other symptoms and signs involving the musculoskeletal system: Secondary | ICD-10-CM | POA: Diagnosis not present

## 2018-10-24 DIAGNOSIS — M542 Cervicalgia: Secondary | ICD-10-CM

## 2018-10-24 DIAGNOSIS — R293 Abnormal posture: Secondary | ICD-10-CM

## 2018-10-24 NOTE — Therapy (Signed)
[place], [name], [address] Phone: [phone]   Fax:  [phone]  Physical Therapy Treatment  Patient Details  Name: Shelly Stein MRN: [id number] Date of Birth: [birth year] Referring Provider (PT): Sallee Lange, MD   Encounter Date: 10/24/2018  PT End of Session - 10/24/18 1032    Visit Number  24    Number of Visits  33    Date for PT Re-Evaluation  11/14/18    Authorization Type  Medicare    Authorization Time Period  09/18/18 to 10/16/18; NEW: 10/17/18 to 11/14/18    Authorization - Visit Number  24    Authorization - Number of Visits  33    PT Start Time  1030    PT Stop Time  1113    PT Time Calculation (min)  43 min    Activity Tolerance  Patient tolerated treatment well;No increased pain    Behavior During Therapy  WFL for tasks assessed/performed       Past Medical History:  Diagnosis Date  . Back pain   . Diverticulosis    mild    Past Surgical History:  Procedure Laterality Date  . ABDOMINAL HYSTERECTOMY    . BREAST BIOPSY Left 06/02/2016  . FOOT SURGERY    . SHOULDER SURGERY      There were no vitals filed for this visit.  Subjective Assessment - 10/24/18 1031    Subjective  Pt reports she is staying at about a 2/10 neck pain. She wasn't that sore from the needling yesterday.     Patient Stated Goals  get some relief    Currently in Pain?  Yes    Pain Score  2     Pain Location  Neck    Pain Orientation  Left    Pain Descriptors / Indicators  Tightness    Pain Type  Chronic pain    Pain Onset  More than a month ago    Pain Frequency  Constant    Aggravating Factors   movement    Pain Relieving Factors  alternating heat and ice    Effect of Pain on Daily Activities  severely impacts            [facility] Adult PT Treatment/Exercise - 10/24/18 0001      Exercises   Exercises  Neck      Neck Exercises: Machines for Strengthening   UBE (Upper Arm Bike)  x1mns retro, L4, for  postural strengthening      Neck Exercises: Standing   Wall Push Ups  10 reps    Upper Extremity D2  Flexion;10 reps;Theraband    Theraband Level (UE D2)  Level 2 (Red)    UE D2 Weights (lbs)  2 sets, min cues for form    Thumb Tacks  inverted Y's on wall for lower trap 2x10 (~3" holds)    Other Standing Exercises  body blade 3x10" vert and horiz perturbations elbow straight      Neck Exercises: Seated   Other Seated Exercise  thoracic ext over 1/2 foam roll x5 reps at 3-4 different segments      Neck Exercises: Prone   Other Prone Exercise  bil Y's x20-30 reps each      Manual Therapy   Manual Therapy  Soft tissue mobilization    Manual therapy comments  completed seperately from all other skilled itnerventions    Soft tissue mobilization  STM L upper trap to reduce restrictions  and pain      Neck Exercises: Stretches   Corner Stretch  3 reps;30 seconds             PT Education - 10/24/18 1117    Education Details  updated HEP to include inverted Y's and prone Y's (denied handout)    Person(s) Educated  Patient    Methods  Explanation;Demonstration;Tactile cues    Comprehension  Verbalized understanding;Returned demonstration       PT Short Term Goals - 10/17/18 1024      PT SHORT TERM GOAL #1   Title  Pt will be independent with HEP and perform consistently in order to improve ROM and decrease pain.    Time  2    Period  Weeks    Status  Achieved      PT SHORT TERM GOAL #2   Title  Pt will have improved cervical AROM by 5 deg throughout in order to decrease pain and improve overall function.    Baseline  10/9: see AROM    Time  2    Period  Weeks    Status  Partially Met      PT SHORT TERM GOAL #3   Title  Pt will score 14/50 or < on the NDI in order to demo mild self-perceived disability due to her neck pain.    Baseline  10/9: 10/50    Time  2    Period  Weeks    Status  Achieved        PT Long Term Goals - 10/17/18 1024      PT LONG TERM GOAL #1    Title  Pt will have improved cervical AROM to 80deg for bil rotation to demo furhter reduced soft tissue restrictions and continue to maximzie her ability to drive with greater ease.    Baseline  11/7: see AROM (revised goal 10/17/18)    Time  4    Period  Weeks    Status  Revised    Target Date  11/14/18      PT LONG TERM GOAL #2   Title  Pt will report decreased neck pain to 1/10 or better intermittently in order to allow her to complete functional tasks with greater ease and promote return to PLOF.    Baseline  11/7: no more than a 2/10 on a daily basis and is now intermittent (revised goal to state 1/10; was 3/10)    Time  4    Period  Weeks    Status  Revised    Target Date  11/14/18      PT LONG TERM GOAL #3   Title  Pt will report being able to sleep through the night and awaken 2x or < during the night due to pain in order to maximize her recovery.    Baseline  11/7: sleep is much better maybe wakes up 1x/night but not every night    Time  4    Period  Weeks    Status  Achieved      PT LONG TERM GOAL #4   Title  Pt will have decreased cervical and periscapular musculature restrictions to minimal in order to maximize her ROM and decrease her pain.    Baseline  11/7: reduced to moderate (revised goal 10/17/18; was moderate)    Time  4    Period  Weeks    Status  Revised    Target Date  11/14/18      PT  LONG TERM GOAL #5   Title  Pt will be able to perform the Deep Neck Flexor Endurace Test for 30 sec with proper form and without pain in order to demo improved functional neck strength.    Baseline  11/7: 26.2sec    Time  4    Period  Weeks    Status  On-going            Plan - 10/24/18 1118    Clinical Impression Statement  Pt continuing to present to therapy with lowered pain levels. Performed postural strengthening and cervical stabilization. Min cues for form and only reports of mm fatigue, not increased pain during. Introduced her to inverted Y's on wall and  prone Y's for lower trap strengthening in order to reduce pain in upper trap. Pt c/o upper trap pain during prone Y's and PT noted that she was compensating with upper trap to elevate her arm; provided tactile feedback to reduce upper trap contraction and focus on engaging lower trap. She was able to perform and engage mm appropriately by end of exercise; still with some compensation on L side compared to R but she felt it working in her lower traps bilaterally when performing exercise in reduced ROM. Added these exercises to HEP in order to continue to strengthen lower trap and reduce upper trap compensation/promote reduced upper trap tightness. Ended with manual STM to promote reduced restrictions and pain. Pt 1/10 pain at EOS, was 2. Continue as planned, progressing as able.     Rehab Potential  Fair    PT Frequency  2x / week    PT Duration  4 weeks    PT Treatment/Interventions  ADLs/Self Care Home Management;Biofeedback;Cryotherapy;Electrical Stimulation;Moist Heat;Ultrasound;DME Instruction;Functional mobility training;Therapeutic activities;Balance training;Neuromuscular re-education;Cognitive remediation;Patient/family education;Manual techniques;Scar mobilization;Passive range of motion;Dry needling;Energy conservation;Taping    PT Next Visit Plan  lower trap strength; continue dry needling upper trap, k-tape PRN, manual for soft tissue restrictions for upper trap, levator scap and scalenes (caution wiht paraspinals due to osteoporosis), stretching, and postrual strenghtening;    PT Home Exercise Plan  eval: supine cervical retractions; 06/14/18: decompression exercise 1-5; 7/15: theraband decompression 1-4; 7/31: upper trap and levator scap stretch (did not provide handout for these); 11/1: scap retraction and GH ext RTB; 11/14: inverted Y's prone Ys    Consulted and Agree with Plan of Care  Patient       Patient will benefit from skilled therapeutic intervention in order to improve the following  deficits and impairments:  Decreased mobility, Decreased range of motion, Decreased strength, Hypomobility, Increased fascial restricitons, Increased muscle spasms, Impaired flexibility, Improper body mechanics, Postural dysfunction, Pain  Visit Diagnosis: Abnormal posture  Cervicalgia  Other symptoms and signs involving the musculoskeletal system     Problem List Patient Active Problem List   Diagnosis Date Noted  . Essential hypertension 07/23/2018  . Abdominal pain, left lower quadrant 03/07/2017  . Nausea and vomiting in adult 03/07/2017  . Rectal bleeding 03/07/2017  . Sciatica of left side 12/12/2013  . Osteoporosis 06/28/2011       Geraldine Solar PT, DPT  [facility] [address] [place], [name], [address] Phone: [phone]   Fax:  [phone]  Name: Shelly Stein MRN: [id number] Date of Birth: Jun 21, 1946

## 2018-10-29 ENCOUNTER — Ambulatory Visit (HOSPITAL_COMMUNITY): Payer: Medicare Other

## 2018-10-29 ENCOUNTER — Encounter (HOSPITAL_COMMUNITY): Payer: Self-pay

## 2018-10-29 DIAGNOSIS — R29898 Other symptoms and signs involving the musculoskeletal system: Secondary | ICD-10-CM

## 2018-10-29 DIAGNOSIS — M542 Cervicalgia: Secondary | ICD-10-CM

## 2018-10-29 DIAGNOSIS — R293 Abnormal posture: Secondary | ICD-10-CM | POA: Diagnosis not present

## 2018-10-29 NOTE — Therapy (Signed)
[place], [name], [address] Phone: [phone]   Fax:  [phone]  Physical Therapy Treatment  Patient Details  Name: Shelly Stein MRN: [id number] Date of Birth: October 10, 1946 Referring Provider (PT): Sallee Lange, MD   Encounter Date: 10/29/2018  PT End of Session - 10/29/18 1259    Visit Number  25    Number of Visits  33    Date for PT Re-Evaluation  11/14/18    Authorization Type  Medicare    Authorization Time Period  09/18/18 to 10/16/18; NEW: 10/17/18 to 11/14/18    Authorization - Visit Number  25    Authorization - Number of Visits  33    PT Start Time  6378    PT Stop Time  1339    PT Time Calculation (min)  40 min    Activity Tolerance  Patient tolerated treatment well;No increased pain    Behavior During Therapy  WFL for tasks assessed/performed       Past Medical History:  Diagnosis Date  . Back pain   . Diverticulosis    mild    Past Surgical History:  Procedure Laterality Date  . ABDOMINAL HYSTERECTOMY    . BREAST BIOPSY Left 06/02/2016  . FOOT SURGERY    . SHOULDER SURGERY      There were no vitals filed for this visit.  Subjective Assessment - 10/29/18 1259    Subjective  Pt reports that her neck is still about a 2/10.     Patient Stated Goals  get some relief    Currently in Pain?  Yes    Pain Score  2     Pain Location  Neck    Pain Orientation  Left    Pain Descriptors / Indicators  Tightness    Pain Type  Chronic pain    Pain Onset  More than a month ago    Pain Frequency  Constant    Aggravating Factors   movement     Pain Relieving Factors  alternating heat and ice    Effect of Pain on Daily Activities  impacts           [facility] Adult PT Treatment/Exercise - 10/29/18 0001      Exercises   Exercises  Neck      Neck Exercises: Seated   X to V  20 reps    Shoulder Rolls  Backwards;20 reps    Shoulder Rolls Limitations  up, back, and down      Manual Therapy    Manual Therapy  Soft tissue mobilization    Manual therapy comments  completed seperately from all other skilled itnerventions    Soft tissue mobilization  STM after needling to L upper trap and levator scap to reduce restrictions and pain      Neck Exercises: Stretches   Upper Trapezius Stretch  Right;Left;2 reps;30 seconds    Levator Stretch  Right;Left;2 reps;30 seconds       Trigger Point Dry Needling - 10/29/18 1330    Consent Given?  Yes    Education Handout Provided  No    Muscles Treated Upper Body  Upper trapezius;Levator scapulae    Upper Trapezius Response  Twitch reponse elicited;Palpable increased muscle length   L in prone   Levator Scapulae Response  Twitch response elicited;Palpable increased muscle length   L in prone            PT Short Term Goals - 10/17/18  1024      PT SHORT TERM GOAL #1   Title  Pt will be independent with HEP and perform consistently in order to improve ROM and decrease pain.    Time  2    Period  Weeks    Status  Achieved      PT SHORT TERM GOAL #2   Title  Pt will have improved cervical AROM by 5 deg throughout in order to decrease pain and improve overall function.    Baseline  10/9: see AROM    Time  2    Period  Weeks    Status  Partially Met      PT SHORT TERM GOAL #3   Title  Pt will score 14/50 or < on the NDI in order to demo mild self-perceived disability due to her neck pain.    Baseline  10/9: 10/50    Time  2    Period  Weeks    Status  Achieved        PT Long Term Goals - 10/17/18 1024      PT LONG TERM GOAL #1   Title  Pt will have improved cervical AROM to 80deg for bil rotation to demo furhter reduced soft tissue restrictions and continue to maximzie her ability to drive with greater ease.    Baseline  11/7: see AROM (revised goal 10/17/18)    Time  4    Period  Weeks    Status  Revised    Target Date  11/14/18      PT LONG TERM GOAL #2   Title  Pt will report decreased neck pain to 1/10 or better  intermittently in order to allow her to complete functional tasks with greater ease and promote return to PLOF.    Baseline  11/7: no more than a 2/10 on a daily basis and is now intermittent (revised goal to state 1/10; was 3/10)    Time  4    Period  Weeks    Status  Revised    Target Date  11/14/18      PT LONG TERM GOAL #3   Title  Pt will report being able to sleep through the night and awaken 2x or < during the night due to pain in order to maximize her recovery.    Baseline  11/7: sleep is much better maybe wakes up 1x/night but not every night    Time  4    Period  Weeks    Status  Achieved      PT LONG TERM GOAL #4   Title  Pt will have decreased cervical and periscapular musculature restrictions to minimal in order to maximize her ROM and decrease her pain.    Baseline  11/7: reduced to moderate (revised goal 10/17/18; was moderate)    Time  4    Period  Weeks    Status  Revised    Target Date  11/14/18      PT LONG TERM GOAL #5   Title  Pt will be able to perform the Deep Neck Flexor Endurace Test for 30 sec with proper form and without pain in order to demo improved functional neck strength.    Baseline  11/7: 26.2sec    Time  4    Period  Weeks    Status  On-going            Plan - 10/29/18 1339    Clinical Impression Statement  Continued with  dry needling this date to further reduce restrictions in cervical mm. Good twitch responses elicited and targeted levator scap this session. Followed up with manual STM to the mm needled to further reduce pain. Ended with stretching and mm activation. Reduced pain to 0.5/10 at EOS, her lowest pain rating since starting therapy. Continue as planned, progressing as able.     Rehab Potential  Fair    PT Frequency  2x / week    PT Duration  4 weeks    PT Treatment/Interventions  ADLs/Self Care Home Management;Biofeedback;Cryotherapy;Electrical Stimulation;Moist Heat;Ultrasound;DME Instruction;Functional mobility  training;Therapeutic activities;Balance training;Neuromuscular re-education;Cognitive remediation;Patient/family education;Manual techniques;Scar mobilization;Passive range of motion;Dry needling;Energy conservation;Taping    PT Next Visit Plan  continue lower trap strength; continue dry needling upper trap, k-tape PRN, manual for soft tissue restrictions for upper trap, levator scap and scalenes (caution wiht paraspinals due to osteoporosis), stretching, and postrual strenghtening;    PT Home Exercise Plan  eval: supine cervical retractions; 06/14/18: decompression exercise 1-5; 7/15: theraband decompression 1-4; 7/31: upper trap and levator scap stretch (did not provide handout for these); 11/1: scap retraction and GH ext RTB; 11/14: inverted Y's prone Ys    Consulted and Agree with Plan of Care  Patient       Patient will benefit from skilled therapeutic intervention in order to improve the following deficits and impairments:  Decreased mobility, Decreased range of motion, Decreased strength, Hypomobility, Increased fascial restricitons, Increased muscle spasms, Impaired flexibility, Improper body mechanics, Postural dysfunction, Pain  Visit Diagnosis: Abnormal posture  Cervicalgia  Other symptoms and signs involving the musculoskeletal system     Problem List Patient Active Problem List   Diagnosis Date Noted  . Essential hypertension 07/23/2018  . Abdominal pain, left lower quadrant 03/07/2017  . Nausea and vomiting in adult 03/07/2017  . Rectal bleeding 03/07/2017  . Sciatica of left side 12/12/2013  . Osteoporosis 06/28/2011         Geraldine Solar PT, DPT   [facility] [address] [place], [name], [address] Phone: [phone]   Fax:  [phone]  Name: TONI DEMO MRN: [id number] Date of Birth: 1946/08/03

## 2018-11-01 ENCOUNTER — Ambulatory Visit (HOSPITAL_COMMUNITY): Payer: Medicare Other

## 2018-11-01 ENCOUNTER — Encounter (HOSPITAL_COMMUNITY): Payer: Self-pay

## 2018-11-01 DIAGNOSIS — R29898 Other symptoms and signs involving the musculoskeletal system: Secondary | ICD-10-CM | POA: Diagnosis not present

## 2018-11-01 DIAGNOSIS — M542 Cervicalgia: Secondary | ICD-10-CM | POA: Diagnosis not present

## 2018-11-01 DIAGNOSIS — R293 Abnormal posture: Secondary | ICD-10-CM

## 2018-11-01 NOTE — Therapy (Signed)
[place], [name], [address] Phone: [phone]   Fax:  [phone]  Physical Therapy Treatment  Patient Details  Name: Shelly Stein MRN: [id number] Date of Birth: 04/05/1946 Referring Provider (PT): Sallee Lange, MD   Encounter Date: 11/01/2018  PT End of Session - 11/01/18 1348    Visit Number  26    Number of Visits  33    Date for PT Re-Evaluation  11/14/18    Authorization Type  Medicare    Authorization Time Period  09/18/18 to 10/16/18; NEW: 10/17/18 to 11/14/18    Authorization - Visit Number  26    Authorization - Number of Visits  33    PT Start Time  6433    PT Stop Time  1430    PT Time Calculation (min)  43 min    Activity Tolerance  Patient tolerated treatment well;No increased pain    Behavior During Therapy  WFL for tasks assessed/performed       Past Medical History:  Diagnosis Date  . Back pain   . Diverticulosis    mild    Past Surgical History:  Procedure Laterality Date  . ABDOMINAL HYSTERECTOMY    . BREAST BIOPSY Left 06/02/2016  . FOOT SURGERY    . SHOULDER SURGERY      There were no vitals filed for this visit.  Subjective Assessment - 11/01/18 1348    Subjective  Pt states that she is about a 1/10 on her R side and 1.5/10 on the L.     Patient Stated Goals  get some relief    Currently in Pain?  Yes    Pain Score  --   1.5   Pain Location  Neck    Pain Orientation  Left    Pain Descriptors / Indicators  Tightness    Pain Type  Chronic pain    Pain Onset  More than a month ago    Pain Frequency  Constant    Aggravating Factors   movement    Pain Relieving Factors  alternating heat and ice    Effect of Pain on Daily Activities  impacts          [facility] Adult PT Treatment/Exercise - 11/01/18 0001      Exercises   Exercises  Neck      Neck Exercises: Machines for Strengthening   UBE (Upper Arm Bike)  x45mns retro, L5, for postural strengthening      Neck  Exercises: Standing   Upper Extremity D1  Extension;10 reps;Theraband    Theraband Level (UE D1)  Level 2 (Red)    UE D1 Weights (lbs)  2 sets    Upper Extremity D2  Flexion;Extension;10 reps;Theraband    Theraband Level (UE D2)  Level 2 (Red)    UE D2 Weights (lbs)  2 sets, min cues for form    Wall Wash  LUE 2x10 reps      Neck Exercises: Supine   Other Supine Exercise  thoracic extension/pec stretch over 1/2 foam roll 3x30"       Neck Exercises: Sidelying   Other Sidelying Exercise  bil throacic rotation hips 90/90 5x10" holds each      Neck Exercises: Prone   Other Prone Exercise  bil Y's x20-30 reps each (more on L than R due to upper trap compensation)    Other Prone Exercise  bil T's for middle trap strength x10 reps each  Manual Therapy   Manual Therapy  Soft tissue mobilization    Manual therapy comments  completed seperately from all other skilled itnerventions    Soft tissue mobilization  STM to bil upper trap, levator scap, cervical paraspinals to reduce pain and soft tissue restricitons           PT Education - 11/01/18 1348    Education Details  exercise technique, continue HEP    Person(s) Educated  Patient    Methods  Explanation;Demonstration    Comprehension  Verbalized understanding;Returned demonstration       PT Short Term Goals - 10/17/18 1024      PT SHORT TERM GOAL #1   Title  Pt will be independent with HEP and perform consistently in order to improve ROM and decrease pain.    Time  2    Period  Weeks    Status  Achieved      PT SHORT TERM GOAL #2   Title  Pt will have improved cervical AROM by 5 deg throughout in order to decrease pain and improve overall function.    Baseline  10/9: see AROM    Time  2    Period  Weeks    Status  Partially Met      PT SHORT TERM GOAL #3   Title  Pt will score 14/50 or < on the NDI in order to demo mild self-perceived disability due to her neck pain.    Baseline  10/9: 10/50    Time  2    Period   Weeks    Status  Achieved        PT Long Term Goals - 10/17/18 1024      PT LONG TERM GOAL #1   Title  Pt will have improved cervical AROM to 80deg for bil rotation to demo furhter reduced soft tissue restrictions and continue to maximzie her ability to drive with greater ease.    Baseline  11/7: see AROM (revised goal 10/17/18)    Time  4    Period  Weeks    Status  Revised    Target Date  11/14/18      PT LONG TERM GOAL #2   Title  Pt will report decreased neck pain to 1/10 or better intermittently in order to allow her to complete functional tasks with greater ease and promote return to PLOF.    Baseline  11/7: no more than a 2/10 on a daily basis and is now intermittent (revised goal to state 1/10; was 3/10)    Time  4    Period  Weeks    Status  Revised    Target Date  11/14/18      PT LONG TERM GOAL #3   Title  Pt will report being able to sleep through the night and awaken 2x or < during the night due to pain in order to maximize her recovery.    Baseline  11/7: sleep is much better maybe wakes up 1x/night but not every night    Time  4    Period  Weeks    Status  Achieved      PT LONG TERM GOAL #4   Title  Pt will have decreased cervical and periscapular musculature restrictions to minimal in order to maximize her ROM and decrease her pain.    Baseline  11/7: reduced to moderate (revised goal 10/17/18; was moderate)    Time  4    Period  Weeks  Status  Revised    Target Date  11/14/18      PT LONG TERM GOAL #5   Title  Pt will be able to perform the Deep Neck Flexor Endurace Test for 30 sec with proper form and without pain in order to demo improved functional neck strength.    Baseline  11/7: 26.2sec    Time  4    Period  Weeks    Status  On-going            Plan - 11/01/18 1445    Clinical Impression Statement  Pt continues to present to therapy with reduced L sided neck pain, though still present. Continued with postural, scapular and cervical  stabilization and strengthening this date while adding some mobility work and pec stretching for posture. Added sidelying thoracic rotation and extension over foam roll for thoracic mobility and pec stretching. She continues to be challenge with prone Y's, mainly on the L, due to lower trap weakness and upper trap compensation. Ended with manual for STM in bil upper traps in order to reduce pain and restrictions. Pt reporting 0/10 pain R side and 0.5/10 pain on the L (was 1/10 R and 2/10 L). Continue as planned, progressing as able.     Clinical Presentation  Stable    Clinical Decision Making  Low    Rehab Potential  Fair    PT Frequency  2x / week    PT Duration  4 weeks    PT Treatment/Interventions  ADLs/Self Care Home Management;Biofeedback;Cryotherapy;Electrical Stimulation;Moist Heat;Ultrasound;DME Instruction;Functional mobility training;Therapeutic activities;Balance training;Neuromuscular re-education;Cognitive remediation;Patient/family education;Manual techniques;Scar mobilization;Passive range of motion;Dry needling;Energy conservation;Taping    PT Next Visit Plan  continue lower trap strength; continue dry needling upper trap, k-tape PRN, manual for soft tissue restrictions for upper trap, levator scap and scalenes (caution wiht paraspinals due to osteoporosis), stretching, and postrual strenghtening;    PT Home Exercise Plan  eval: supine cervical retractions; 06/14/18: decompression exercise 1-5; 7/15: theraband decompression 1-4; 7/31: upper trap and levator scap stretch (did not provide handout for these); 11/1: scap retraction and GH ext RTB; 11/14: inverted Y's, prone Ys    Consulted and Agree with Plan of Care  Patient       Patient will benefit from skilled therapeutic intervention in order to improve the following deficits and impairments:  Decreased mobility, Decreased range of motion, Decreased strength, Hypomobility, Increased fascial restricitons, Increased muscle spasms,  Impaired flexibility, Improper body mechanics, Postural dysfunction, Pain  Visit Diagnosis: Abnormal posture  Cervicalgia  Other symptoms and signs involving the musculoskeletal system     Problem List Patient Active Problem List   Diagnosis Date Noted  . Essential hypertension 07/23/2018  . Abdominal pain, left lower quadrant 03/07/2017  . Nausea and vomiting in adult 03/07/2017  . Rectal bleeding 03/07/2017  . Sciatica of left side 12/12/2013  . Osteoporosis 06/28/2011       Geraldine Solar PT, DPT  [facility] [address] [place], [name], [address] Phone: [phone]   Fax:  [phone]  Name: Shelly Stein MRN: [id number] Date of Birth: December 18, 1945

## 2018-11-04 ENCOUNTER — Telehealth (HOSPITAL_COMMUNITY): Payer: Self-pay | Admitting: Family Medicine

## 2018-11-04 NOTE — Telephone Encounter (Signed)
11/04/18  pt called and asked that we cx this appt... no reason was given

## 2018-11-05 ENCOUNTER — Ambulatory Visit (HOSPITAL_COMMUNITY): Payer: Medicare Other

## 2018-11-11 ENCOUNTER — Telehealth (HOSPITAL_COMMUNITY): Payer: Self-pay

## 2018-11-11 NOTE — Telephone Encounter (Signed)
Patient switched both of her appt for this week to next week.

## 2018-11-13 ENCOUNTER — Encounter (HOSPITAL_COMMUNITY): Payer: Medicare Other

## 2018-11-14 ENCOUNTER — Other Ambulatory Visit (HOSPITAL_COMMUNITY): Payer: Self-pay | Admitting: Gastroenterology

## 2018-11-14 DIAGNOSIS — K224 Dyskinesia of esophagus: Secondary | ICD-10-CM | POA: Diagnosis not present

## 2018-11-14 DIAGNOSIS — R110 Nausea: Secondary | ICD-10-CM

## 2018-11-14 DIAGNOSIS — K2970 Gastritis, unspecified, without bleeding: Secondary | ICD-10-CM | POA: Diagnosis not present

## 2018-11-15 ENCOUNTER — Encounter (HOSPITAL_COMMUNITY): Payer: Medicare Other

## 2018-11-18 ENCOUNTER — Ambulatory Visit (HOSPITAL_COMMUNITY): Payer: Medicare Other | Attending: Family Medicine

## 2018-11-18 ENCOUNTER — Encounter (HOSPITAL_COMMUNITY): Payer: Self-pay

## 2018-11-18 DIAGNOSIS — M542 Cervicalgia: Secondary | ICD-10-CM | POA: Diagnosis not present

## 2018-11-18 DIAGNOSIS — R293 Abnormal posture: Secondary | ICD-10-CM | POA: Diagnosis not present

## 2018-11-18 DIAGNOSIS — R29898 Other symptoms and signs involving the musculoskeletal system: Secondary | ICD-10-CM | POA: Diagnosis not present

## 2018-11-18 NOTE — Therapy (Signed)
[place], [name], [address] Phone: [phone]   Fax:  [phone]   Progress Note Reporting Period 10/17/18 to 11/18/18  See note below for Objective Data and Assessment of Progress/Goals.   Physical Therapy Treatment  Patient Details  Name: Shelly Stein MRN: [id number] Date of Birth: [birth year] Referring Provider (PT): Sallee Lange, MD   Encounter Date: 11/18/2018  PT End of Session - 11/18/18 1030    Visit Number  27    Number of Visits  33    Date for PT Re-Evaluation  12/16/18    Authorization Type  Medicare    Authorization Time Period  09/18/18 to 10/16/18; NEW: 10/17/18 to 11/14/18; 4-week HEP POC 11/18/18 to 12/16/18    Authorization - Visit Number  27    Authorization - Number of Visits  33    PT Start Time  0017    PT Stop Time  1049    PT Time Calculation (min)  19 min    Activity Tolerance  Patient tolerated treatment well;No increased pain    Behavior During Therapy  WFL for tasks assessed/performed       Past Medical History:  Diagnosis Date  . Back pain   . Diverticulosis    mild    Past Surgical History:  Procedure Laterality Date  . ABDOMINAL HYSTERECTOMY    . BREAST BIOPSY Left 06/02/2016  . FOOT SURGERY    . SHOULDER SURGERY      There were no vitals filed for this visit.  Subjective Assessment - 11/18/18 1030    Subjective  Pt reports that she moved into her new place for the most part. Her neck did well during all of that and her neck pain is still a 1 or </10 currently.     Patient Stated Goals  get some relief    Currently in Pain?  Yes    Pain Score  --   1 or <   Pain Location  Neck    Pain Orientation  Left    Pain Descriptors / Indicators  Tightness    Pain Type  Chronic pain    Pain Onset  More than a month ago    Pain Frequency  Constant    Aggravating Factors   movement    Pain Relieving Factors  alternating heat and ice    Effect of Pain on Daily Activities   impacts         [facility] PT Assessment - 11/18/18 0001      Assessment   Medical Diagnosis  Neck pain    Referring Provider (PT)  Sallee Lange, MD    Next MD Visit  no f/u appt with Dr. Wolfgang Phoenix as of yet - mainly following her for Boniva injections for osteoporosis   Glenna Fellows, MD  her neurosurgeon in February 2020     Other:   Other/ Comments  DNFE test: 30sec, no pain   was 26.2sec     AROM   Cervical Flexion  52   was 46   Cervical Extension  50   was 46   Cervical - Right Side Bend  32   was 26   Cervical - Left Side Bend  34   was 32   Cervical - Right Rotation  71   was 71   Cervical - Left Rotation  78   was 74     Palpation   Palpation comment  moderate still but  significant improvement from initial eval and signficnat reduciton in pain to palpaion           PT Education - 11/18/18 1030    Education Details  reassessment findings    Person(s) Educated  Patient    Methods  Explanation;Demonstration    Comprehension  Verbalized understanding;Returned demonstration          PT Short Term Goals - 11/18/18 1033      PT SHORT TERM GOAL #1   Title  Pt will be independent with HEP and perform consistently in order to improve ROM and decrease pain.    Time  2    Period  Weeks    Status  Achieved      PT SHORT TERM GOAL #2   Title  Pt will have improved cervical AROM by 5 deg throughout in order to decrease pain and improve overall function.    Baseline  12/9: see AROM    Time  2    Period  Weeks    Status  Achieved      PT SHORT TERM GOAL #3   Title  Pt will score 14/50 or < on the NDI in order to demo mild self-perceived disability due to her neck pain.    Baseline  10/9: 10/50    Time  2    Period  Weeks    Status  Achieved        PT Long Term Goals - 11/18/18 1034      PT LONG TERM GOAL #1   Title  Pt will have improved cervical AROM to 80deg for bil rotation to demo furhter reduced soft tissue restrictions and continue to maximzie her  ability to drive with greater ease.    Baseline  12/9: 71 R and 74 L    Time  4    Period  Weeks    Status  On-going      PT LONG TERM GOAL #2   Title  Pt will report decreased neck pain to 1/10 or better intermittently in order to allow her to complete functional tasks with greater ease and promote return to PLOF.    Baseline  12/9: agrees with this    Time  4    Period  Weeks    Status  Achieved      PT LONG TERM GOAL #3   Title  Pt will report being able to sleep through the night and awaken 2x or < during the night due to pain in order to maximize her recovery.    Baseline  12/9: sleeping thru the night and if she does wake up it's not due to neck pain    Time  4    Period  Weeks    Status  Achieved      PT LONG TERM GOAL #4   Title  Pt will have decreased cervical and periscapular musculature restrictions to minimal in order to maximize her ROM and decrease her pain.    Baseline  12/9: still moderate, but significantlly reduced pain to palpation and restrictions compared to initial eval    Time  4    Period  Weeks    Status  On-going      PT LONG TERM GOAL #5   Title  Pt will be able to perform the Deep Neck Flexor Endurace Test for 30 sec with proper form and without pain in order to demo improved functional neck strength.    Baseline  12/9: 30sec,  no pain    Time  4    Period  Weeks    Status  Achieved            Plan - 11/18/18 1058    Clinical Impression Statement  PT reassessed pt's goals and outcome measures this date. Pt has made tremendous improvement since resuming therapy in October and overall since her initial eval. She has not been able to attend therapy for personal reasons the last almost 2.5 weeks and her neck pain has not really bothered her at all; she was able to move into her new home and with other stressors her neck pain remained 1 or less out of 10. Her overall AROM has improved and her neck strength has improved. PT recommending placing pt on  4-week HEP POC for her to maintain progress made in therapy and manage symptoms independently. Pt will have 1 f/u appointment in a month, however, this PT feels that pt will not need this f/u appt and will be d/c at that time. No changes or additions made to the HEP as what she has is appropriate and effective for her.     Rehab Potential  Fair    PT Frequency  --    PT Duration  4 weeks   1 f/u appt in 4 weeks   PT Treatment/Interventions  ADLs/Self Care Home Management;Biofeedback;Cryotherapy;Electrical Stimulation;Moist Heat;Ultrasound;DME Instruction;Functional mobility training;Therapeutic activities;Balance training;Neuromuscular re-education;Cognitive remediation;Patient/family education;Manual techniques;Scar mobilization;Passive range of motion;Dry needling;Energy conservation;Taping    PT Next Visit Plan  reassess and d/c after 4-week HEP POC    PT Home Exercise Plan  eval: supine cervical retractions; 06/14/18: decompression exercise 1-5; 7/15: theraband decompression 1-4; 7/31: upper trap and levator scap stretch (did not provide handout for these); 11/1: scap retraction and GH ext RTB; 11/14: inverted Y's, prone Ys    Consulted and Agree with Plan of Care  Patient       Patient will benefit from skilled therapeutic intervention in order to improve the following deficits and impairments:  Decreased mobility, Decreased range of motion, Decreased strength, Hypomobility, Increased fascial restricitons, Increased muscle spasms, Impaired flexibility, Improper body mechanics, Postural dysfunction, Pain  Visit Diagnosis: Abnormal posture - Plan: PT plan of care cert/re-cert  Cervicalgia - Plan: PT plan of care cert/re-cert  Other symptoms and signs involving the musculoskeletal system - Plan: PT plan of care cert/re-cert     Problem List Patient Active Problem List   Diagnosis Date Noted  . Essential hypertension 07/23/2018  . Abdominal pain, left lower quadrant 03/07/2017  . Nausea  and vomiting in adult 03/07/2017  . Rectal bleeding 03/07/2017  . Sciatica of left side 12/12/2013  . Osteoporosis 06/28/2011       Geraldine Solar PT, DPT  [facility] [address] [place], [name], [address] Phone: [phone]   Fax:  [phone]  Name: CLEDA IMEL MRN: [id number] Date of Birth: [birth year]

## 2018-11-21 ENCOUNTER — Encounter (HOSPITAL_COMMUNITY): Payer: Medicare Other

## 2018-11-21 ENCOUNTER — Encounter (HOSPITAL_COMMUNITY): Payer: Self-pay

## 2018-11-21 ENCOUNTER — Encounter (HOSPITAL_COMMUNITY)
Admission: RE | Admit: 2018-11-21 | Discharge: 2018-11-21 | Disposition: A | Discharge status: Home / Self Care | Payer: Medicare Other | Source: Ambulatory Visit | Attending: Family Medicine | Admitting: Family Medicine

## 2018-11-21 ENCOUNTER — Encounter (HOSPITAL_COMMUNITY): Admission: RE | Admit: 2018-11-21 | Payer: Medicare Other | Source: Ambulatory Visit

## 2018-11-21 DIAGNOSIS — M810 Age-related osteoporosis without current pathological fracture: Secondary | ICD-10-CM | POA: Insufficient documentation

## 2018-11-21 MED ORDER — IBANDRONATE SODIUM 3 MG/3ML IV SOLN
INTRAVENOUS | Status: AC
  Filled 2018-11-21: qty 3

## 2018-11-21 MED ORDER — IBANDRONATE SODIUM 3 MG/3ML IV SOLN
3.0000 mg | Freq: Once | INTRAVENOUS | Status: AC
  Administered 2018-11-21: 3 mg via INTRAVENOUS

## 2018-11-22 ENCOUNTER — Ambulatory Visit (HOSPITAL_COMMUNITY)
Admission: RE | Admit: 2018-11-22 | Discharge: 2018-11-22 | Disposition: A | Discharge status: Home / Self Care | Payer: Medicare Other | Source: Ambulatory Visit | Attending: Gastroenterology | Admitting: Gastroenterology

## 2018-11-22 DIAGNOSIS — R110 Nausea: Secondary | ICD-10-CM | POA: Insufficient documentation

## 2018-11-22 MED ORDER — TECHNETIUM TC 99M MEBROFENIN IV KIT
5.0000 | PACK | Freq: Once | INTRAVENOUS | Status: AC | PRN
  Administered 2018-11-22: 5 via INTRAVENOUS

## 2018-12-03 ENCOUNTER — Other Ambulatory Visit (HOSPITAL_COMMUNITY): Payer: Self-pay | Admitting: Gastroenterology

## 2018-12-03 DIAGNOSIS — R110 Nausea: Secondary | ICD-10-CM

## 2018-12-09 ENCOUNTER — Other Ambulatory Visit (HOSPITAL_COMMUNITY): Payer: Medicare Other

## 2018-12-12 ENCOUNTER — Encounter (HOSPITAL_COMMUNITY): Payer: Self-pay

## 2018-12-12 ENCOUNTER — Ambulatory Visit (HOSPITAL_COMMUNITY)
Admission: RE | Admit: 2018-12-12 | Discharge: 2018-12-12 | Disposition: A | Discharge status: Home / Self Care | Payer: Medicare Other | Source: Ambulatory Visit | Attending: Gastroenterology | Admitting: Gastroenterology

## 2018-12-12 DIAGNOSIS — R6881 Early satiety: Secondary | ICD-10-CM | POA: Diagnosis not present

## 2018-12-12 DIAGNOSIS — R110 Nausea: Secondary | ICD-10-CM | POA: Diagnosis not present

## 2018-12-12 HISTORY — DX: Essential (primary) hypertension: I10

## 2018-12-12 MED ORDER — TECHNETIUM TC 99M SULFUR COLLOID
2.0000 | Freq: Once | INTRAVENOUS | Status: AC | PRN
  Administered 2018-12-12: 1.85 via ORAL

## 2018-12-16 ENCOUNTER — Other Ambulatory Visit: Payer: Self-pay | Admitting: Family Medicine

## 2018-12-19 ENCOUNTER — Ambulatory Visit (HOSPITAL_COMMUNITY): Payer: Medicare Other

## 2018-12-23 ENCOUNTER — Encounter (HOSPITAL_COMMUNITY): Payer: Self-pay

## 2018-12-23 NOTE — Therapy (Signed)
[place], [name], [address] Phone: [phone]   Fax:  [phone]  Patient Details  Name: Shelly Stein MRN: [id number] Date of Birth: [birth year] Referring Provider:  No ref. provider found  Encounter Date: 12/23/2018   Pt cancelled her last appointment on 12/19/18 due to maintaining progress made and without increased pain over last 4-week HEP POC.  PHYSICAL THERAPY DISCHARGE SUMMARY  Visits from Start of Care: 27  Current functional level related to goals / functional outcomes: See last note   Remaining deficits: See last note   Education / Equipment: n/a  Plan: Patient agrees to discharge.  Patient goals were met. Patient is being discharged due to being pleased with the current functional level.  ?????     Geraldine Solar PT, [facility] [address] [place], [name], [address] Phone: [phone]   Fax:  [phone]

## 2019-01-21 DIAGNOSIS — M4712 Other spondylosis with myelopathy, cervical region: Secondary | ICD-10-CM | POA: Diagnosis not present

## 2019-01-31 ENCOUNTER — Telehealth: Payer: Self-pay | Admitting: Family Medicine

## 2019-01-31 DIAGNOSIS — M810 Age-related osteoporosis without current pathological fracture: Secondary | ICD-10-CM

## 2019-01-31 NOTE — Telephone Encounter (Signed)
Pt used to get Boniva injections, stopped per Dr. Bary Leriche recommendation due to jaw pain  Recently restarted them, is due an injection in March (had one in Sept & Dec)  Having the jaw pain again  Pt wonders if she should continue with the injections or if we may want to change pt's treatment?    Please advise & call pt - per pt this is not urgent and a call back in a couple days will be fine

## 2019-01-31 NOTE — Telephone Encounter (Signed)
Please advise. Thank you

## 2019-02-11 NOTE — Telephone Encounter (Signed)
1.  I did look into this matter further 2.  It is not a common side effect with IV biphosphonate's but it is possible 3.  I would recommend stopping the IV biphosphonate's 4.  I recommend canceling her upcoming injection 5.  Please let the patient know that I will touch base with specialist about whether or not another measures such as Prolia may be helpful for her 6.  Please inform the patient of the above, once I touch base with specialist I will forward the information to the patient 7.  After speaking with the patient please send this message back to me thank you

## 2019-02-12 ENCOUNTER — Ambulatory Visit (INDEPENDENT_AMBULATORY_CARE_PROVIDER_SITE_OTHER): Payer: Medicare Other | Admitting: Family Medicine

## 2019-02-12 ENCOUNTER — Encounter: Payer: Self-pay | Admitting: Family Medicine

## 2019-02-12 VITALS — BP 92/78 | Temp 98.5°F | Wt 122.0 lb

## 2019-02-12 DIAGNOSIS — L03011 Cellulitis of right finger: Secondary | ICD-10-CM | POA: Diagnosis not present

## 2019-02-12 MED ORDER — DOXYCYCLINE HYCLATE 100 MG PO TABS: 100.0000 mg | ORAL_TABLET | Freq: Two times a day (BID) | ORAL | 0 refills | Status: DC

## 2019-02-12 NOTE — Telephone Encounter (Signed)
Patient notified and verbalized understanding and stated she would call to cancel her injection.

## 2019-02-12 NOTE — Progress Notes (Signed)
   Subjective:    Patient ID: Shelly Stein, female    DOB: 07-17-1946, 73 y.o.   MRN: [id number]  HPI  Patient is here today with a red raise cyst on her right index finger.  She states it is red and sore to touch.   She noticed this on Monday and it seems to be getting bigger.  She has not put any salve on the area, and had taken Tylenol.  Review of Systems Inflamed area on her right index finger is a raised red papule tender painful denies joint pain elsewhere denies fever chills sweats denies wheezing difficulty breathing nausea vomiting diarrhea    Objective:   Physical Exam  Lungs clear respiratory rate normal heart regular no murmurs extremities no edema skin warm dry papular area noted on the finger that is tender to the touch does not appear to be herpes whitlow more than likely a staph infection not an abscess currently  15 minutes was spent with patient today discussing healthcare issues which they came.  More than 50% of this visit-total duration of visit-was spent in counseling and coordination of care.  Please see diagnosis regarding the focus of this coordination and care     Assessment & Plan:  Folliculitis early cellulitis doxycycline twice daily warm compresses warning signs discussed not an abscess at this point

## 2019-02-14 ENCOUNTER — Ambulatory Visit (INDEPENDENT_AMBULATORY_CARE_PROVIDER_SITE_OTHER): Payer: Medicare Other | Admitting: Family Medicine

## 2019-02-14 ENCOUNTER — Encounter: Payer: Self-pay | Admitting: Family Medicine

## 2019-02-14 ENCOUNTER — Telehealth: Payer: Self-pay | Admitting: Family Medicine

## 2019-02-14 VITALS — Temp 98.7°F | Wt 121.6 lb

## 2019-02-14 DIAGNOSIS — L0291 Cutaneous abscess, unspecified: Secondary | ICD-10-CM | POA: Diagnosis not present

## 2019-02-14 NOTE — Telephone Encounter (Signed)
Nurses please find out from the patient is it swollen?  Tender red?  If there is any doubt regarding what is going on it would be best for the patient to just come by late this afternoon so I can look at it

## 2019-02-14 NOTE — Telephone Encounter (Signed)
Patient giving update about finger she states now her finger has a white dot in the middle of the redness and would like to know if this is normal with considering being on the antibiotic. Advise.

## 2019-02-14 NOTE — Telephone Encounter (Signed)
Patient notified and is coming in to office to see Dr Nicki Reaper today at 4 pm

## 2019-02-14 NOTE — Progress Notes (Signed)
   Subjective:    Patient ID: Shelly Stein, female    DOB: 1946-10-04, 73 y.o.   MRN: [id number]  HPIrecheck cellulitis of right finger. Taking doxy. Pain is not as bad but pt states a white spot came up on finger today.  Significant pain discomfort in the finger that swelling tender denies any other particular troubles denies high fever chills sweats   Review of Systems See above    Objective:   Physical Exam  It is apparent that this area on her finger is now become an abscess I discussed in detail with the patient her options whether or not to drain it she agrees that draining it would be the right thing to do  Timeout was given It was numbed with 1% lidocaine with no epinephrine 11.  Blade was used after cleaning skin with Betadine Surgical technique along with sterile technique Significant pus drainage was obtained Bandage was placed    Assessment & Plan:  Abscess with cellulitis more than likely staph finish out the doxycycline notify us early next week if not doing better area should do much better after being drained

## 2019-02-20 ENCOUNTER — Inpatient Hospital Stay (HOSPITAL_COMMUNITY): Admission: RE | Admit: 2019-02-20 | Payer: Medicare Other | Source: Ambulatory Visit

## 2019-02-23 NOTE — Telephone Encounter (Signed)
I recommend referral to [facility] for consideration for Prolia This has a much less chance of causing this issue I also recommend that the patient consider strongly to see [facility] in 4 months from now not sooner due to coronavirus issues In other words this is not an emergent issue

## 2019-02-24 NOTE — Addendum Note (Signed)
Addended by: Dairl Ponder on: 02/24/2019 10:13 AM   Modules accepted: Orders

## 2019-02-24 NOTE — Telephone Encounter (Signed)
Referral ordered in Epic. Patient notified and verbalized understanding. 

## 2019-02-27 ENCOUNTER — Encounter: Payer: Self-pay | Admitting: Family Medicine

## 2019-03-19 ENCOUNTER — Ambulatory Visit (INDEPENDENT_AMBULATORY_CARE_PROVIDER_SITE_OTHER): Payer: Medicare Other

## 2019-03-19 ENCOUNTER — Encounter (INDEPENDENT_AMBULATORY_CARE_PROVIDER_SITE_OTHER): Payer: Self-pay | Admitting: Orthopaedic Surgery

## 2019-03-19 ENCOUNTER — Other Ambulatory Visit: Payer: Self-pay

## 2019-03-19 ENCOUNTER — Ambulatory Visit (INDEPENDENT_AMBULATORY_CARE_PROVIDER_SITE_OTHER): Payer: Medicare Other | Admitting: Orthopaedic Surgery

## 2019-03-19 VITALS — Ht 63.0 in | Wt 120.0 lb

## 2019-03-19 DIAGNOSIS — G8929 Other chronic pain: Secondary | ICD-10-CM

## 2019-03-19 DIAGNOSIS — M25512 Pain in left shoulder: Secondary | ICD-10-CM

## 2019-03-19 MED ORDER — BUPIVACAINE HCL 0.5 % IJ SOLN
2.0000 mL | INTRAMUSCULAR | Status: AC | PRN
  Administered 2019-03-19: 2 mL via INTRA_ARTICULAR

## 2019-03-19 MED ORDER — METHYLPREDNISOLONE ACETATE 40 MG/ML IJ SUSP
80.0000 mg | INTRAMUSCULAR | Status: AC | PRN
  Administered 2019-03-19: 80 mg via INTRA_ARTICULAR

## 2019-03-19 MED ORDER — LIDOCAINE HCL 2 % IJ SOLN
2.0000 mL | INTRAMUSCULAR | Status: AC | PRN
  Administered 2019-03-19: 2 mL

## 2019-03-19 NOTE — Progress Notes (Signed)
Office Visit Note   Patient: Shelly Stein           Date of Birth: 1946-03-11           MRN: [id number] Visit Date: 03/19/2019              Requested by: Kathyrn Drown, MD [place], [place] [address] PCP: Kathyrn Drown, MD   Assessment & Plan: Visit Diagnoses:  1. Acute pain of left shoulder   2. Chronic left shoulder pain     Plan: Impingement syndrome left shoulder possibility of rotator cuff tear as patient had rotator cuff tear of right shoulder with successful surgery.  Will inject the subacromial space and monitor response.  If no improvement consider MRI scan  Follow-Up Instructions: No follow-ups on file.   Orders:  Orders Placed This Encounter  Procedures  . Large Joint Inj: L subacromial bursa  . XR Shoulder Left   No orders of the defined types were placed in this encounter.     Procedures: Large Joint Inj: L subacromial bursa on 03/19/2019 9:55 AM Indications: pain and diagnostic evaluation Details: 25 G 1.5 in needle, anterolateral approach  Arthrogram: No  Medications: 2 mL lidocaine 2 %; 2 mL bupivacaine 0.5 %; 80 mg methylPREDNISolone acetate 40 MG/ML Consent was given by the patient. Immediately prior to procedure a time out was called to verify the correct patient, procedure, equipment, support staff and site/side marked as required. Patient was prepped and draped in the usual sterile fashion.       Clinical Data: No additional findings.   Subjective: Chief Complaint  Patient presents with  . Left Shoulder - Pain  Patient presents today for pain in her left shoulder. She said that it has been hurting for 4 weeks. No known injury. She said that it is tender to the touch superiorly. She said that the pain is all throughout her shoulder and "crunches". No pain radiates down her arm, and no numbness or tingling. She takes tylenol but states that it does not help. She said that she has good range of motion.  HPI  Review  of Systems   Objective: Vital Signs: Ht 5\' 3"  (1.6 m)   Wt 120 lb (54.4 kg)   BMI 21.26 kg/m   Physical Exam Constitutional:      Appearance: She is well-developed.  Eyes:     Pupils: Pupils are equal, round, and reactive to light.  Pulmonary:     Effort: Pulmonary effort is normal.  Skin:    General: Skin is warm and dry.  Neurological:     Mental Status: She is alert and oriented to person, place, and time.  Psychiatric:        Behavior: Behavior normal.     Ortho Exam awake alert and oriented x3.  Comfortable sitting.  Positive impingement on the extreme of external rotation.  Minimally positive empty can test.  No popping or clicking with motion of the shoulder.  Good grip and good release.  Biceps intact.  No evidence of adhesive capsulitis.  Good strength  Specialty Comments:  No specialty comments available.  Imaging: Xr Shoulder Left  Result Date: 03/19/2019 Films of the left shoulder obtained in several projections.  There is degenerative change at the acromioclavicular joint with hypertrophic spurring tickly on the clavicle.  Humeral head is centered about the glenoid.  No ectopic calcification.  No obvious glenohumeral arthropathy    PMFS History: Patient Active Problem  List   Diagnosis Date Noted  . Essential hypertension 07/23/2018  . Abdominal pain, left lower quadrant 03/07/2017  . Nausea and vomiting in adult 03/07/2017  . Rectal bleeding 03/07/2017  . Sciatica of left side 12/12/2013  . Osteoporosis 06/28/2011   Past Medical History:  Diagnosis Date  . Back pain   . Diverticulosis    mild  . Hypertension     Family History  Problem Relation Age of Onset  . Breast cancer Mother   . Heart disease Mother   . Aneurysm Father   . Colon cancer Neg Hx   . Stomach cancer Neg Hx   . Rectal cancer Neg Hx   . Esophageal cancer Neg Hx   . Liver cancer Neg Hx     Past Surgical History:  Procedure Laterality Date  . ABDOMINAL HYSTERECTOMY    .  BREAST BIOPSY Left 06/02/2016  . FOOT SURGERY    . SHOULDER SURGERY     Social History   Occupational History  . Occupation: part time  [place] Use  . Smoking status: Never Smoker  . Smokeless tobacco: Never Used  Substance and Sexual Activity  . Alcohol use: No  . Drug use: No  . Sexual activity: Not on file

## 2019-04-11 ENCOUNTER — Other Ambulatory Visit: Payer: Self-pay | Admitting: Obstetrics and Gynecology

## 2019-04-11 DIAGNOSIS — Z1231 Encounter for screening mammogram for malignant neoplasm of breast: Secondary | ICD-10-CM

## 2019-05-13 DIAGNOSIS — M810 Age-related osteoporosis without current pathological fracture: Secondary | ICD-10-CM | POA: Diagnosis not present

## 2019-05-13 DIAGNOSIS — R350 Frequency of micturition: Secondary | ICD-10-CM | POA: Diagnosis not present

## 2019-05-13 DIAGNOSIS — M1990 Unspecified osteoarthritis, unspecified site: Secondary | ICD-10-CM | POA: Diagnosis not present

## 2019-05-13 DIAGNOSIS — M542 Cervicalgia: Secondary | ICD-10-CM | POA: Diagnosis not present

## 2019-05-13 DIAGNOSIS — R6884 Jaw pain: Secondary | ICD-10-CM | POA: Diagnosis not present

## 2019-05-13 DIAGNOSIS — Z79899 Other long term (current) drug therapy: Secondary | ICD-10-CM | POA: Diagnosis not present

## 2019-06-05 ENCOUNTER — Other Ambulatory Visit: Payer: Self-pay

## 2019-06-05 ENCOUNTER — Ambulatory Visit
Admission: RE | Admit: 2019-06-05 | Discharge: 2019-06-05 | Disposition: A | Discharge status: Home / Self Care | Payer: Medicare Other | Source: Ambulatory Visit | Attending: Obstetrics and Gynecology | Admitting: Obstetrics and Gynecology

## 2019-06-05 DIAGNOSIS — Z1231 Encounter for screening mammogram for malignant neoplasm of breast: Secondary | ICD-10-CM

## 2019-06-05 DIAGNOSIS — M7752 Other enthesopathy of left foot: Secondary | ICD-10-CM | POA: Diagnosis not present

## 2019-06-05 DIAGNOSIS — M79672 Pain in left foot: Secondary | ICD-10-CM | POA: Diagnosis not present

## 2019-06-05 DIAGNOSIS — B079 Viral wart, unspecified: Secondary | ICD-10-CM | POA: Diagnosis not present

## 2019-06-05 DIAGNOSIS — M2012 Hallux valgus (acquired), left foot: Secondary | ICD-10-CM | POA: Diagnosis not present

## 2019-06-05 DIAGNOSIS — G5762 Lesion of plantar nerve, left lower limb: Secondary | ICD-10-CM | POA: Diagnosis not present

## 2019-06-13 ENCOUNTER — Other Ambulatory Visit: Payer: Self-pay | Admitting: Family Medicine

## 2019-06-16 NOTE — Telephone Encounter (Signed)
May have 1 refill recommend follow-up by the end of summer can be virtual

## 2019-06-18 DIAGNOSIS — K224 Dyskinesia of esophagus: Secondary | ICD-10-CM | POA: Diagnosis not present

## 2019-06-18 DIAGNOSIS — K2970 Gastritis, unspecified, without bleeding: Secondary | ICD-10-CM | POA: Diagnosis not present

## 2019-06-18 DIAGNOSIS — R110 Nausea: Secondary | ICD-10-CM | POA: Diagnosis not present

## 2019-06-24 DIAGNOSIS — G5762 Lesion of plantar nerve, left lower limb: Secondary | ICD-10-CM | POA: Diagnosis not present

## 2019-06-24 DIAGNOSIS — M1990 Unspecified osteoarthritis, unspecified site: Secondary | ICD-10-CM | POA: Diagnosis not present

## 2019-06-24 DIAGNOSIS — Z79899 Other long term (current) drug therapy: Secondary | ICD-10-CM | POA: Diagnosis not present

## 2019-06-24 DIAGNOSIS — M542 Cervicalgia: Secondary | ICD-10-CM | POA: Diagnosis not present

## 2019-06-24 DIAGNOSIS — M7752 Other enthesopathy of left foot: Secondary | ICD-10-CM | POA: Diagnosis not present

## 2019-06-24 DIAGNOSIS — R6884 Jaw pain: Secondary | ICD-10-CM | POA: Diagnosis not present

## 2019-06-24 DIAGNOSIS — M810 Age-related osteoporosis without current pathological fracture: Secondary | ICD-10-CM | POA: Diagnosis not present

## 2019-06-26 ENCOUNTER — Other Ambulatory Visit: Payer: Self-pay | Admitting: Gastroenterology

## 2019-06-26 DIAGNOSIS — R110 Nausea: Secondary | ICD-10-CM

## 2019-07-04 ENCOUNTER — Ambulatory Visit
Admission: RE | Admit: 2019-07-04 | Discharge: 2019-07-04 | Disposition: A | Discharge status: Home / Self Care | Payer: Medicare Other | Source: Ambulatory Visit | Attending: Gastroenterology | Admitting: Gastroenterology

## 2019-07-04 ENCOUNTER — Other Ambulatory Visit: Payer: Self-pay

## 2019-07-04 DIAGNOSIS — R51 Headache: Secondary | ICD-10-CM | POA: Diagnosis not present

## 2019-07-04 DIAGNOSIS — R110 Nausea: Secondary | ICD-10-CM

## 2019-07-08 ENCOUNTER — Other Ambulatory Visit: Payer: Self-pay | Admitting: Gastroenterology

## 2019-07-08 ENCOUNTER — Other Ambulatory Visit (HOSPITAL_COMMUNITY): Payer: Self-pay | Admitting: Gastroenterology

## 2019-07-08 ENCOUNTER — Ambulatory Visit (HOSPITAL_COMMUNITY)
Admission: RE | Admit: 2019-07-08 | Discharge: 2019-07-08 | Disposition: A | Discharge status: Home / Self Care | Payer: Medicare Other | Source: Ambulatory Visit | Attending: Gastroenterology | Admitting: Gastroenterology

## 2019-07-08 ENCOUNTER — Other Ambulatory Visit: Payer: Self-pay

## 2019-07-08 DIAGNOSIS — G9340 Encephalopathy, unspecified: Secondary | ICD-10-CM

## 2019-07-08 MED ORDER — GADOBUTROL 1 MMOL/ML IV SOLN
5.0000 mL | Freq: Once | INTRAVENOUS | Status: AC | PRN
  Administered 2019-07-08: 11:00:00 5 mL via INTRAVENOUS

## 2019-07-10 ENCOUNTER — Other Ambulatory Visit: Payer: Self-pay

## 2019-07-10 ENCOUNTER — Ambulatory Visit (INDEPENDENT_AMBULATORY_CARE_PROVIDER_SITE_OTHER): Payer: Medicare Other | Admitting: Neurology

## 2019-07-10 ENCOUNTER — Encounter: Payer: Self-pay | Admitting: Neurology

## 2019-07-10 VITALS — BP 116/72 | HR 68 | Temp 97.8°F | Ht 63.0 in | Wt 115.5 lb

## 2019-07-10 DIAGNOSIS — R110 Nausea: Secondary | ICD-10-CM | POA: Insufficient documentation

## 2019-07-10 NOTE — Progress Notes (Signed)
PATIENT: Shelly Stein DOB: [birth year]  Chief Complaint  Patient presents with  . Abnormal CT/MRI    Reports increased nausea, headaches, tinnitus and unsteady gait.  She would like to discuss her brain MRI findings today.  . Gastroenterology    Otis Brace, MD (referrring MD)  . PCP    Kathyrn Drown, MD     HISTORICAL  Shelly Stein is a 73 years old female, seen in request by GI specialist Dr.Brahmbhatt, Parag, and her  primary care physician Dr. Wolfgang Phoenix, Nicki Reaper A, for evaluation of MRI scans, initial evaluation was on July 10, 2019.  I have reviewed and summarized the referring note from the referring physician.  She had past medical history of hypothyroidism, severe osteoporosis, history of ischemic colitis, frequent nausea, EGD in February 2019 showed abnormal motility of esophagus, small hiatal hernia, gastritis. Her symptoms have improved with PPI  Laboratory evaluations LDL 115, BMP sodium was 133, creatinine 0.79, vitamin D 28  Looking for potential etiology of her frequent nausea, she was referred to CT head without contrast on July 04, 2019: I personally reviewed the film, there was described posterior white matter hypoattenuation, some loss of gray and white matter differentiation in the left occipital pole,  This leads to MRI of the brain on July 08, 2019, there was no acute abnormality noted  Patient denies a history of hypertension, no headaches, has occasionally lightheadedness, decreased appetite, no visual loss, no lateralized motor or sensory deficit,   REVIEW OF SYSTEMS: Full 14 system review of systems performed and notable only for as above All other review of systems were negative.  ALLERGIES: No Known Allergies  HOME MEDICATIONS: Current Outpatient Medications  Medication Sig Dispense Refill  . Ascorbic Acid (VITAMIN C) 1000 MG tablet Take 1,000 mg by mouth daily.    Marland Kitchen aspirin 81 MG tablet Take 81 mg by mouth daily.    . Fish Oil OIL  Take 1,000 mg by mouth.    Marland Kitchen lisinopril (ZESTRIL) 5 MG tablet TAKE 1/2 TABLET BY MOUTH EVERY DAY 15 tablet 0  . Multiple Vitamins-Minerals (MULTIVITAMIN WITH MINERALS) tablet Take 1 tablet by mouth daily.    . pantoprazole (PROTONIX) 40 MG tablet Take 1 tablet by mouth daily.    . sucralfate (CARAFATE) 1 g tablet Take 1 tablet by mouth 2 (two) times a day.     No current facility-administered medications for this visit.     PAST MEDICAL HISTORY: Past Medical History:  Diagnosis Date  . Abnormal MRI   . Back pain   . Benign cyst of breast, left   . Diverticulosis    mild  . Gastritis   . Headache   . Hypertension   . Nausea   . Nutcracker esophagus   . Osteoporosis   . Sciatica of left side   . Tinnitus   . Unsteady gait     PAST SURGICAL HISTORY: Past Surgical History:  Procedure Laterality Date  . ABDOMINAL HYSTERECTOMY    . BREAST BIOPSY Left 06/02/2016  . ESOPHAGEAL MANOMETRY    . FLEXIBLE SIGMOIDOSCOPY    . FOOT SURGERY    . SHOULDER SURGERY      FAMILY HISTORY: Family History  Problem Relation Age of Onset  . Breast cancer Mother   . Heart disease Mother   . Aneurysm Father   . Colon cancer Neg Hx   . Stomach cancer Neg Hx   . Rectal cancer Neg Hx   . Esophageal cancer  Neg Hx   . Liver cancer Neg Hx     SOCIAL HISTORY: Social History   Socioeconomic History  . Marital status: Widowed    Spouse name: Not on file  . Number of children: 1  . Years of education: some college  . Highest education level: Not on file  Occupational History  . Occupation: part time  [employer]  . Financial resource strain: Not on file  . Food insecurity    Worry: Not on file    Inability: Not on file  . Transportation needs    Medical: Not on file    Non-medical: Not on file  Tobacco Use  . Smoking status: Never Smoker  . Smokeless tobacco: Never Used  Substance and Sexual Activity  . Alcohol use: No  . Drug use: No  . Sexual activity: Not  on file  Lifestyle  . Physical activity    Days per week: Not on file    Minutes per session: Not on file  . Stress: Not on file  Relationships  . Social Herbalist on phone: Not on file    Gets together: Not on file    Attends religious service: Not on file    Active member of club or organization: Not on file    Attends meetings of clubs or organizations: Not on file    Relationship status: Not on file  . Intimate partner violence    Fear of current or ex partner: Not on file    Emotionally abused: Not on file    Physically abused: Not on file    Forced sexual activity: Not on file  Other Topics Concern  . Not on file  Social History Narrative   Lives at home alone.   Right-handed.   Occasional caffeine.     PHYSICAL EXAM   Vitals:   07/10/19 1458  BP: 116/72  Pulse: 68  Temp: 97.8 F (36.6 C)  Weight: 115 lb 8 oz (52.4 kg)  Height: 5\' 3"  (1.6 m)    Not recorded      Body mass index is 20.46 kg/m.  PHYSICAL EXAMNIATION:  Gen: NAD, conversant, well nourised, obese, well groomed                     Cardiovascular: Regular rate rhythm, no peripheral edema, warm, nontender. Eyes: Conjunctivae clear without exudates or hemorrhage Neck: Supple, no carotid bruits. Pulmonary: Clear to auscultation bilaterally   NEUROLOGICAL EXAM:  MENTAL STATUS: Speech:    Speech is normal; fluent and spontaneous with normal comprehension.  Cognition:     Orientation to time, place and person     Normal recent and remote memory     Normal Attention span and concentration     Normal Language, naming, repeating,spontaneous speech     Fund of knowledge   CRANIAL NERVES: CN II: Visual fields are full to confrontation. Pupils are round equal and briskly reactive to light. CN III, IV, VI: extraocular movement are normal. No ptosis. CN V: Facial sensation is intact to pinprick in all 3 divisions bilaterally. Corneal responses are intact.  CN VII: Face is symmetric  with normal eye closure and smile. CN VIII: Hearing is normal to rubbing fingers CN IX, X: Palate elevates symmetrically. Phonation is normal. CN XI: Head turning and shoulder shrug are intact CN XII: Tongue is midline with normal movements and no atrophy.  MOTOR: There is no pronator drift of out-stretched  arms. Muscle bulk and tone are normal. Muscle strength is normal.  REFLEXES: Reflexes are 2+ and symmetric at the biceps, triceps, knees, and ankles. Plantar responses are flexor.  SENSORY: Intact to light touch, pinprick, positional sensation and vibratory sensation are intact in fingers and toes.  COORDINATION: Rapid alternating movements and fine finger movements are intact. There is no dysmetria on finger-to-nose and heel-knee-shin.    GAIT/STANCE: Posture is normal. Gait is steady with normal steps, base, arm swing, and turning. Heel and toe walking are normal. Tandem gait is normal.  Romberg is absent.   DIAGNOSTIC DATA (LABS, IMAGING, TESTING) - I reviewed patient records, labs, notes, testing and imaging myself where available.   ASSESSMENT AND PLAN  Shelly Stein is a 73 y.o. female   Frequent nausea  Continue GI evaluations,  Personally reviewed MRI of the brain on July 08, 2019, there was no significant abnormality.   Marcial Pacas, M.D. Ph.D.  [facility] [address], [place], [place] [address] Ph: [phone] Fax: [phone]  CC: Kathyrn Drown, MD

## 2019-07-15 ENCOUNTER — Ambulatory Visit: Payer: Medicare Other | Admitting: Diagnostic Neuroimaging

## 2019-07-16 ENCOUNTER — Other Ambulatory Visit: Payer: Self-pay | Admitting: Family Medicine

## 2019-07-22 DIAGNOSIS — K224 Dyskinesia of esophagus: Secondary | ICD-10-CM | POA: Diagnosis not present

## 2019-07-22 DIAGNOSIS — R110 Nausea: Secondary | ICD-10-CM | POA: Diagnosis not present

## 2019-07-22 DIAGNOSIS — K2970 Gastritis, unspecified, without bleeding: Secondary | ICD-10-CM | POA: Diagnosis not present

## 2019-07-31 ENCOUNTER — Other Ambulatory Visit: Payer: Self-pay

## 2019-07-31 ENCOUNTER — Encounter: Payer: Self-pay | Admitting: Family Medicine

## 2019-07-31 ENCOUNTER — Ambulatory Visit (INDEPENDENT_AMBULATORY_CARE_PROVIDER_SITE_OTHER): Payer: Medicare Other | Admitting: Family Medicine

## 2019-07-31 VITALS — BP 108/72 | Temp 98.3°F | Ht 63.0 in | Wt 115.0 lb

## 2019-07-31 DIAGNOSIS — M810 Age-related osteoporosis without current pathological fracture: Secondary | ICD-10-CM | POA: Diagnosis not present

## 2019-07-31 DIAGNOSIS — Z13220 Encounter for screening for lipoid disorders: Secondary | ICD-10-CM

## 2019-07-31 DIAGNOSIS — I10 Essential (primary) hypertension: Secondary | ICD-10-CM | POA: Diagnosis not present

## 2019-07-31 DIAGNOSIS — R634 Abnormal weight loss: Secondary | ICD-10-CM | POA: Diagnosis not present

## 2019-07-31 MED ORDER — ZOSTER VAC RECOMB ADJUVANTED 50 MCG/0.5ML IM SUSR: 0.5000 mL | Freq: Once | INTRAMUSCULAR | 1 refills | Status: DC

## 2019-07-31 MED ORDER — LISINOPRIL 5 MG PO TABS: 2.5000 mg | ORAL_TABLET | Freq: Every day | ORAL | 3 refills | Status: DC

## 2019-07-31 NOTE — Progress Notes (Signed)
Subjective:    Patient ID: Shelly Stein, female    DOB: 1946/06/12, 73 y.o.   MRN: [id number]  Hypertension This is a chronic problem. Pertinent negatives include no chest pain or shortness of breath. Treatments tried: lisinopril 2.5mg . There are no compliance problems (takes meds every day, does not eat much, does drink boost, walks for exercise).    Sees GI for weight loss and nausea that has been going on for over one and a half years.  The patient is being followed by [facility] gastroenterology They have done an extensive amount of tests have not done an EGD at this point  Patient also saw neurology for further evaluation of a possible open encephalopathy issue apparently that can come and go but currently right now doing okay  She relates she has been gradually losing weight she has persistent nausea that comes and goes along with some intermittent abdominal pain at times she feels lightheaded and woozy at times dizzy but other times she seems to do okay she states she just cannot eat much other times she does not feel like eating much her gastroenterologist apparently will be seeing her again in the fall possible EGD at that time Pt states no concerns today.    Review of Systems  Constitutional: Negative for activity change, appetite change and fatigue.  HENT: Negative for congestion and rhinorrhea.   Respiratory: Negative for cough and shortness of breath.   Cardiovascular: Negative for chest pain and leg swelling.  Gastrointestinal: Positive for nausea. Negative for abdominal pain and diarrhea.  Endocrine: Negative for polydipsia and polyphagia.  Skin: Negative for color change.  Neurological: Negative for dizziness and weakness.  Psychiatric/Behavioral: Negative for behavioral problems and confusion.       Objective:   Physical Exam Vitals signs reviewed.  Constitutional:      General: She is not in acute distress. HENT:     Head: Normocephalic and atraumatic.  Eyes:      General:        Right eye: No discharge.        Left eye: No discharge.  Neck:     Trachea: No tracheal deviation.  Cardiovascular:     Rate and Rhythm: Normal rate and regular rhythm.     Heart sounds: Normal heart sounds. No murmur.  Pulmonary:     Effort: Pulmonary effort is normal. No respiratory distress.     Breath sounds: Normal breath sounds.  Lymphadenopathy:     Cervical: No cervical adenopathy.  Skin:    General: Skin is warm and dry.  Neurological:     Mental Status: She is alert.     Coordination: Coordination normal.  Psychiatric:        Behavior: Behavior normal.           Assessment & Plan:  Weight loss It is concerning that there could be an underlying issue possibly developing a issue related to the adrenal glands.  We will check some lab work but I believe because of her weight loss nausea intermittent abdominal pains and occasional dizziness it is reasonable to see the endocrinologist for further evaluation to make sure adrenal insufficiency/Addison's is not present  As for blood pressure under good control today continue current measures refills given  Lab work ordered follow-up in spring follow-up sooner if any problems warnings discussed  15 minutes was spent with patient today discussing healthcare issues which they came.  More than 50% of this visit-total duration of visit-was spent in  counseling and coordination of care.  Please see diagnosis regarding the focus of this coordination and care  Shin Grix vaccine recommended

## 2019-08-01 ENCOUNTER — Other Ambulatory Visit: Payer: Medicare Other

## 2019-08-01 DIAGNOSIS — M810 Age-related osteoporosis without current pathological fracture: Secondary | ICD-10-CM | POA: Diagnosis not present

## 2019-08-01 DIAGNOSIS — Z13220 Encounter for screening for lipoid disorders: Secondary | ICD-10-CM | POA: Diagnosis not present

## 2019-08-01 DIAGNOSIS — I10 Essential (primary) hypertension: Secondary | ICD-10-CM | POA: Diagnosis not present

## 2019-08-02 LAB — LIPID PANEL
Chol/HDL Ratio: 2.9 ratio (ref 0.0–4.4)
Cholesterol, Total: 202 mg/dL — ABNORMAL HIGH (ref 100–199)
HDL: 69 mg/dL (ref >39)
LDL Calculated: 119 mg/dL — ABNORMAL HIGH (ref 0–99)
Triglycerides: 68 mg/dL (ref 0–149)
VLDL Cholesterol Cal: 14 mg/dL (ref 5–40)

## 2019-08-02 LAB — BASIC METABOLIC PANEL
BUN/Creatinine Ratio: 16 (ref 12–28)
BUN: 14 mg/dL (ref 8–27)
CO2: 25 mmol/L (ref 20–29)
Calcium: 9.8 mg/dL (ref 8.7–10.3)
Chloride: 104 mmol/L (ref 96–106)
Creatinine, Ser: 0.85 mg/dL (ref 0.57–1.00)
GFR calc Af Amer: 79 mL/min/{1.73_m2} (ref >59)
GFR calc non Af Amer: 68 mL/min/{1.73_m2} (ref >59)
Glucose: 97 mg/dL (ref 65–99)
Potassium: 4.1 mmol/L (ref 3.5–5.2)
Sodium: 142 mmol/L (ref 134–144)

## 2019-08-02 LAB — VITAMIN D 25 HYDROXY (VIT D DEFICIENCY, FRACTURES): Vit D, 25-Hydroxy: 45.3 ng/mL (ref 30.0–100.0)

## 2019-08-06 ENCOUNTER — Encounter: Payer: Self-pay | Admitting: Family Medicine

## 2019-08-11 DIAGNOSIS — Z7189 Other specified counseling: Secondary | ICD-10-CM | POA: Diagnosis not present

## 2019-08-11 DIAGNOSIS — R110 Nausea: Secondary | ICD-10-CM | POA: Diagnosis not present

## 2019-08-11 DIAGNOSIS — Z6821 Body mass index (BMI) 21.0-21.9, adult: Secondary | ICD-10-CM | POA: Diagnosis not present

## 2019-08-11 DIAGNOSIS — R634 Abnormal weight loss: Secondary | ICD-10-CM | POA: Diagnosis not present

## 2019-08-14 ENCOUNTER — Other Ambulatory Visit: Payer: Self-pay | Admitting: Family Medicine

## 2019-08-20 ENCOUNTER — Other Ambulatory Visit (HOSPITAL_COMMUNITY): Payer: Self-pay

## 2019-08-21 ENCOUNTER — Ambulatory Visit (HOSPITAL_COMMUNITY)
Admission: RE | Admit: 2019-08-21 | Discharge: 2019-08-21 | Disposition: A | Discharge status: Home / Self Care | Payer: Medicare Other | Source: Ambulatory Visit | Attending: Internal Medicine | Admitting: Internal Medicine

## 2019-08-21 ENCOUNTER — Other Ambulatory Visit: Payer: Self-pay

## 2019-08-21 DIAGNOSIS — R634 Abnormal weight loss: Secondary | ICD-10-CM | POA: Insufficient documentation

## 2019-08-21 LAB — ACTH STIMULATION, 3 TIME POINTS
Cortisol, 30 Min: 22.2 ug/dL
Cortisol, 60 Min: 24.9 ug/dL
Cortisol, Base: 9.1 ug/dL

## 2019-08-21 MED ORDER — COSYNTROPIN 0.25 MG IJ SOLR
0.2500 mg | Freq: Once | INTRAMUSCULAR | Status: AC
  Administered 2019-08-21: 09:00:00 0.25 mg via INTRAVENOUS

## 2019-08-21 MED ORDER — COSYNTROPIN 0.25 MG IJ SOLR
INTRAMUSCULAR | Status: AC
  Administered 2019-08-21: 09:00:00 0.25 mg via INTRAVENOUS
  Filled 2019-08-21: qty 0.25

## 2019-08-23 LAB — ACTH: C206 ACTH: 15.5 pg/mL (ref 7.2–63.3)

## 2019-09-04 DIAGNOSIS — R634 Abnormal weight loss: Secondary | ICD-10-CM | POA: Diagnosis not present

## 2019-09-04 DIAGNOSIS — R110 Nausea: Secondary | ICD-10-CM | POA: Diagnosis not present

## 2019-09-04 DIAGNOSIS — M810 Age-related osteoporosis without current pathological fracture: Secondary | ICD-10-CM | POA: Diagnosis not present

## 2019-09-04 DIAGNOSIS — Z7189 Other specified counseling: Secondary | ICD-10-CM | POA: Diagnosis not present

## 2019-10-01 ENCOUNTER — Other Ambulatory Visit: Payer: Self-pay

## 2019-10-01 DIAGNOSIS — Z20822 Contact with and (suspected) exposure to covid-19: Secondary | ICD-10-CM

## 2019-10-01 DIAGNOSIS — Z20828 Contact with and (suspected) exposure to other viral communicable diseases: Secondary | ICD-10-CM | POA: Diagnosis not present

## 2019-10-02 LAB — NOVEL CORONAVIRUS, NAA: SARS-CoV-2, NAA: NOT DETECTED

## 2019-10-29 ENCOUNTER — Encounter: Payer: Self-pay | Admitting: Orthopaedic Surgery

## 2019-10-29 ENCOUNTER — Ambulatory Visit (INDEPENDENT_AMBULATORY_CARE_PROVIDER_SITE_OTHER): Payer: Medicare Other | Admitting: Orthopaedic Surgery

## 2019-10-29 VITALS — BP 110/74 | HR 73 | Ht 64.0 in | Wt 120.0 lb

## 2019-10-29 DIAGNOSIS — M25511 Pain in right shoulder: Secondary | ICD-10-CM

## 2019-10-29 DIAGNOSIS — M25512 Pain in left shoulder: Secondary | ICD-10-CM | POA: Diagnosis not present

## 2019-10-29 DIAGNOSIS — G8929 Other chronic pain: Secondary | ICD-10-CM

## 2019-10-29 MED ORDER — METHYLPREDNISOLONE ACETATE 40 MG/ML IJ SUSP
80.0000 mg | INTRAMUSCULAR | Status: AC | PRN
  Administered 2019-10-29: 80 mg via INTRA_ARTICULAR

## 2019-10-29 MED ORDER — BUPIVACAINE HCL 0.5 % IJ SOLN
2.0000 mL | INTRAMUSCULAR | Status: AC | PRN
  Administered 2019-10-29: 2 mL via INTRA_ARTICULAR

## 2019-10-29 MED ORDER — LIDOCAINE HCL 2 % IJ SOLN
2.0000 mL | INTRAMUSCULAR | Status: AC | PRN
  Administered 2019-10-29: 2 mL

## 2019-10-29 NOTE — Progress Notes (Signed)
Office Visit Note   Patient: Shelly Stein           Date of Birth: Mar 25, 1946           MRN: [id number] Visit Date: 10/29/2019              Requested by: Kathyrn Drown, MD [place],  [place] [address] PCP: Kathyrn Drown, MD   Assessment & Plan: Visit Diagnoses:  1. Chronic left shoulder pain   2. Chronic right shoulder pain     Plan: Recurrent symptoms of impingement left shoulder.  Will inject the subacromial space.  Suggested MRI scan for diagnostic purposes.  Mrs. Mallicoat would like to "wait".  Follow-Up Instructions: Return if symptoms worsen or fail to improve.   Orders:  Orders Placed This Encounter  Procedures  . Large Joint Inj: L subacromial bursa   No orders of the defined types were placed in this encounter.     Procedures: Large Joint Inj: L subacromial bursa on 10/29/2019 11:21 AM Indications: pain and diagnostic evaluation Details: 25 G 1.5 in needle, anterolateral approach  Arthrogram: No  Medications: 2 mL lidocaine 2 %; 2 mL bupivacaine 0.5 %; 80 mg methylPREDNISolone acetate 40 MG/ML Consent was given by the patient. Immediately prior to procedure a time out was called to verify the correct patient, procedure, equipment, support staff and site/side marked as required. Patient was prepped and draped in the usual sterile fashion.       Clinical Data: No additional findings.   Subjective: Chief Complaint  Patient presents with  . Left Shoulder - Pain  Patient presents today for recurrent left shoulder pain. If she puts pressure on her shoulder it causes severe pain. She is unable to raise her arm completely. She states that she had an injection in April and it helped until August. She does not take anything over the counter for pain because it does not help.   HPI  Review of Systems   Objective: Vital Signs: BP 110/74   Pulse 73   Ht 5\' 4"  (1.626 m)   Wt 120 lb (54.4 kg)   BMI 20.60 kg/m   Physical Exam  Constitutional:      Appearance: She is well-developed.  Eyes:     Pupils: Pupils are equal, round, and reactive to light.  Pulmonary:     Effort: Pulmonary effort is normal.  Skin:    General: Skin is warm and dry.  Neurological:     Mental Status: She is alert and oriented to person, place, and time.  Psychiatric:        Behavior: Behavior normal.     Ortho Exam awake alert and oriented x3.  Comfortable sitting.  Positive impingement and empty can testing left shoulder.  Good strength.  Painful overhead arc of motion but relatively mild.  Biceps intact.  Good grip and release.  Some pain over the anterior subacromial region but not at the [facility] joint  Specialty Comments:  No specialty comments available.  Imaging: No results found.   PMFS History: Patient Active Problem List   Diagnosis Date Noted  . Pain in left shoulder 10/29/2019  . Nauseous 07/10/2019  . Essential hypertension 07/23/2018  . Abdominal pain, left lower quadrant 03/07/2017  . Nausea and vomiting in adult 03/07/2017  . Rectal bleeding 03/07/2017  . Sciatica of left side 12/12/2013  . Osteoporosis 06/28/2011   Past Medical History:  Diagnosis Date  . Abnormal MRI   .  Back pain   . Benign cyst of breast, left   . Diverticulosis    mild  . Gastritis   . Headache   . Hypertension   . Nausea   . Nutcracker esophagus   . Osteoporosis   . Sciatica of left side   . Tinnitus   . Unsteady gait     Family History  Problem Relation Age of Onset  . Breast cancer Mother   . Heart disease Mother   . Aneurysm Father   . Colon cancer Neg Hx   . Stomach cancer Neg Hx   . Rectal cancer Neg Hx   . Esophageal cancer Neg Hx   . Liver cancer Neg Hx     Past Surgical History:  Procedure Laterality Date  . ABDOMINAL HYSTERECTOMY    . BREAST BIOPSY Left 06/02/2016  . ESOPHAGEAL MANOMETRY    . FLEXIBLE SIGMOIDOSCOPY    . FOOT SURGERY    . SHOULDER SURGERY     Social History   Occupational History  .  Occupation: part time  [employer] Use  . Smoking status: Never Smoker  . Smokeless tobacco: Never Used  Substance and Sexual Activity  . Alcohol use: No  . Drug use: No  . Sexual activity: Not on file

## 2019-11-10 DIAGNOSIS — M542 Cervicalgia: Secondary | ICD-10-CM | POA: Diagnosis not present

## 2019-11-10 DIAGNOSIS — M810 Age-related osteoporosis without current pathological fracture: Secondary | ICD-10-CM | POA: Diagnosis not present

## 2019-11-10 DIAGNOSIS — R110 Nausea: Secondary | ICD-10-CM | POA: Diagnosis not present

## 2019-11-10 DIAGNOSIS — K219 Gastro-esophageal reflux disease without esophagitis: Secondary | ICD-10-CM | POA: Diagnosis not present

## 2019-11-10 DIAGNOSIS — K259 Gastric ulcer, unspecified as acute or chronic, without hemorrhage or perforation: Secondary | ICD-10-CM | POA: Diagnosis not present

## 2019-11-10 DIAGNOSIS — M1990 Unspecified osteoarthritis, unspecified site: Secondary | ICD-10-CM | POA: Diagnosis not present

## 2019-11-10 DIAGNOSIS — Z79899 Other long term (current) drug therapy: Secondary | ICD-10-CM | POA: Diagnosis not present

## 2019-11-18 DIAGNOSIS — H35372 Puckering of macula, left eye: Secondary | ICD-10-CM | POA: Diagnosis not present

## 2019-11-18 DIAGNOSIS — H2513 Age-related nuclear cataract, bilateral: Secondary | ICD-10-CM | POA: Diagnosis not present

## 2020-04-06 DIAGNOSIS — L03115 Cellulitis of right lower limb: Secondary | ICD-10-CM | POA: Diagnosis not present

## 2020-04-06 DIAGNOSIS — A6920 Lyme disease, unspecified: Secondary | ICD-10-CM | POA: Diagnosis not present

## 2020-04-27 DIAGNOSIS — L03115 Cellulitis of right lower limb: Secondary | ICD-10-CM | POA: Diagnosis not present

## 2020-04-27 DIAGNOSIS — A6920 Lyme disease, unspecified: Secondary | ICD-10-CM | POA: Diagnosis not present

## 2020-05-03 ENCOUNTER — Other Ambulatory Visit: Payer: Self-pay | Admitting: Obstetrics and Gynecology

## 2020-05-03 DIAGNOSIS — Z1231 Encounter for screening mammogram for malignant neoplasm of breast: Secondary | ICD-10-CM

## 2020-05-06 DIAGNOSIS — M25519 Pain in unspecified shoulder: Secondary | ICD-10-CM | POA: Diagnosis not present

## 2020-05-06 DIAGNOSIS — M25422 Effusion, left elbow: Secondary | ICD-10-CM | POA: Diagnosis not present

## 2020-05-06 DIAGNOSIS — M19011 Primary osteoarthritis, right shoulder: Secondary | ICD-10-CM | POA: Diagnosis not present

## 2020-05-06 DIAGNOSIS — M79643 Pain in unspecified hand: Secondary | ICD-10-CM | POA: Diagnosis not present

## 2020-05-06 DIAGNOSIS — M19041 Primary osteoarthritis, right hand: Secondary | ICD-10-CM | POA: Diagnosis not present

## 2020-05-06 DIAGNOSIS — K219 Gastro-esophageal reflux disease without esophagitis: Secondary | ICD-10-CM | POA: Diagnosis not present

## 2020-05-06 DIAGNOSIS — M1990 Unspecified osteoarthritis, unspecified site: Secondary | ICD-10-CM | POA: Diagnosis not present

## 2020-05-06 DIAGNOSIS — M19042 Primary osteoarthritis, left hand: Secondary | ICD-10-CM | POA: Diagnosis not present

## 2020-05-06 DIAGNOSIS — M25511 Pain in right shoulder: Secondary | ICD-10-CM | POA: Diagnosis not present

## 2020-05-06 DIAGNOSIS — M25512 Pain in left shoulder: Secondary | ICD-10-CM | POA: Diagnosis not present

## 2020-05-06 DIAGNOSIS — M19012 Primary osteoarthritis, left shoulder: Secondary | ICD-10-CM | POA: Diagnosis not present

## 2020-05-06 DIAGNOSIS — M810 Age-related osteoporosis without current pathological fracture: Secondary | ICD-10-CM | POA: Diagnosis not present

## 2020-05-06 DIAGNOSIS — M25522 Pain in left elbow: Secondary | ICD-10-CM | POA: Diagnosis not present

## 2020-05-06 DIAGNOSIS — Z79899 Other long term (current) drug therapy: Secondary | ICD-10-CM | POA: Diagnosis not present

## 2020-05-06 DIAGNOSIS — M79642 Pain in left hand: Secondary | ICD-10-CM | POA: Diagnosis not present

## 2020-05-06 DIAGNOSIS — M79641 Pain in right hand: Secondary | ICD-10-CM | POA: Diagnosis not present

## 2020-05-06 DIAGNOSIS — M542 Cervicalgia: Secondary | ICD-10-CM | POA: Diagnosis not present

## 2020-05-06 DIAGNOSIS — M47812 Spondylosis without myelopathy or radiculopathy, cervical region: Secondary | ICD-10-CM | POA: Diagnosis not present

## 2020-05-14 DIAGNOSIS — R1032 Left lower quadrant pain: Secondary | ICD-10-CM | POA: Diagnosis not present

## 2020-05-14 DIAGNOSIS — R197 Diarrhea, unspecified: Secondary | ICD-10-CM | POA: Diagnosis not present

## 2020-05-14 DIAGNOSIS — K224 Dyskinesia of esophagus: Secondary | ICD-10-CM | POA: Diagnosis not present

## 2020-05-14 DIAGNOSIS — R110 Nausea: Secondary | ICD-10-CM | POA: Diagnosis not present

## 2020-05-18 ENCOUNTER — Other Ambulatory Visit: Payer: Self-pay | Admitting: Internal Medicine

## 2020-05-18 ENCOUNTER — Other Ambulatory Visit: Payer: Self-pay | Admitting: Gastroenterology

## 2020-05-18 DIAGNOSIS — R1032 Left lower quadrant pain: Secondary | ICD-10-CM

## 2020-05-18 DIAGNOSIS — R110 Nausea: Secondary | ICD-10-CM

## 2020-05-18 DIAGNOSIS — R197 Diarrhea, unspecified: Secondary | ICD-10-CM

## 2020-05-20 DIAGNOSIS — E790 Hyperuricemia without signs of inflammatory arthritis and tophaceous disease: Secondary | ICD-10-CM | POA: Diagnosis not present

## 2020-05-20 DIAGNOSIS — R768 Other specified abnormal immunological findings in serum: Secondary | ICD-10-CM | POA: Diagnosis not present

## 2020-05-20 DIAGNOSIS — M542 Cervicalgia: Secondary | ICD-10-CM | POA: Diagnosis not present

## 2020-05-20 DIAGNOSIS — M79643 Pain in unspecified hand: Secondary | ICD-10-CM | POA: Diagnosis not present

## 2020-05-20 DIAGNOSIS — M1990 Unspecified osteoarthritis, unspecified site: Secondary | ICD-10-CM | POA: Diagnosis not present

## 2020-05-20 DIAGNOSIS — Z79899 Other long term (current) drug therapy: Secondary | ICD-10-CM | POA: Diagnosis not present

## 2020-05-20 DIAGNOSIS — K219 Gastro-esophageal reflux disease without esophagitis: Secondary | ICD-10-CM | POA: Diagnosis not present

## 2020-05-20 DIAGNOSIS — M25519 Pain in unspecified shoulder: Secondary | ICD-10-CM | POA: Diagnosis not present

## 2020-05-20 DIAGNOSIS — M810 Age-related osteoporosis without current pathological fracture: Secondary | ICD-10-CM | POA: Diagnosis not present

## 2020-05-20 DIAGNOSIS — M109 Gout, unspecified: Secondary | ICD-10-CM | POA: Diagnosis not present

## 2020-05-20 DIAGNOSIS — M25422 Effusion, left elbow: Secondary | ICD-10-CM | POA: Diagnosis not present

## 2020-05-24 ENCOUNTER — Ambulatory Visit
Admission: RE | Admit: 2020-05-24 | Discharge: 2020-05-24 | Disposition: A | Discharge status: Home / Self Care | Payer: Medicare Other | Source: Ambulatory Visit | Attending: Gastroenterology | Admitting: Gastroenterology

## 2020-05-24 DIAGNOSIS — K5730 Diverticulosis of large intestine without perforation or abscess without bleeding: Secondary | ICD-10-CM | POA: Diagnosis not present

## 2020-05-24 DIAGNOSIS — R197 Diarrhea, unspecified: Secondary | ICD-10-CM

## 2020-05-24 DIAGNOSIS — R110 Nausea: Secondary | ICD-10-CM

## 2020-05-24 DIAGNOSIS — R1032 Left lower quadrant pain: Secondary | ICD-10-CM

## 2020-05-24 MED ORDER — IOPAMIDOL (ISOVUE-300) INJECTION 61%
100.0000 mL | Freq: Once | INTRAVENOUS | Status: AC | PRN
  Administered 2020-05-24: 100 mL via INTRAVENOUS

## 2020-06-07 ENCOUNTER — Other Ambulatory Visit: Payer: Self-pay

## 2020-06-07 ENCOUNTER — Ambulatory Visit
Admission: RE | Admit: 2020-06-07 | Discharge: 2020-06-07 | Disposition: A | Discharge status: Home / Self Care | Payer: Medicare Other | Source: Ambulatory Visit | Attending: Obstetrics and Gynecology | Admitting: Obstetrics and Gynecology

## 2020-06-07 DIAGNOSIS — Z1231 Encounter for screening mammogram for malignant neoplasm of breast: Secondary | ICD-10-CM

## 2020-07-21 DIAGNOSIS — R3121 Asymptomatic microscopic hematuria: Secondary | ICD-10-CM | POA: Diagnosis not present

## 2020-07-28 ENCOUNTER — Encounter: Payer: Self-pay | Admitting: Family Medicine

## 2020-07-28 DIAGNOSIS — R3129 Other microscopic hematuria: Secondary | ICD-10-CM | POA: Insufficient documentation

## 2020-07-28 HISTORY — DX: Other microscopic hematuria: R31.29

## 2020-07-29 ENCOUNTER — Other Ambulatory Visit: Payer: Medicare Other

## 2020-07-29 ENCOUNTER — Other Ambulatory Visit: Payer: Self-pay

## 2020-07-29 DIAGNOSIS — Z20822 Contact with and (suspected) exposure to covid-19: Secondary | ICD-10-CM

## 2020-07-30 LAB — SARS-COV-2, NAA 2 DAY TAT

## 2020-07-30 LAB — NOVEL CORONAVIRUS, NAA: SARS-CoV-2, NAA: NOT DETECTED

## 2020-08-05 DIAGNOSIS — M25422 Effusion, left elbow: Secondary | ICD-10-CM | POA: Diagnosis not present

## 2020-08-05 DIAGNOSIS — M0579 Rheumatoid arthritis with rheumatoid factor of multiple sites without organ or systems involvement: Secondary | ICD-10-CM | POA: Diagnosis not present

## 2020-08-05 DIAGNOSIS — R768 Other specified abnormal immunological findings in serum: Secondary | ICD-10-CM | POA: Diagnosis not present

## 2020-08-05 DIAGNOSIS — M1990 Unspecified osteoarthritis, unspecified site: Secondary | ICD-10-CM | POA: Diagnosis not present

## 2020-08-05 DIAGNOSIS — M810 Age-related osteoporosis without current pathological fracture: Secondary | ICD-10-CM | POA: Diagnosis not present

## 2020-08-05 DIAGNOSIS — M79643 Pain in unspecified hand: Secondary | ICD-10-CM | POA: Diagnosis not present

## 2020-08-05 DIAGNOSIS — M109 Gout, unspecified: Secondary | ICD-10-CM | POA: Diagnosis not present

## 2020-08-05 DIAGNOSIS — M542 Cervicalgia: Secondary | ICD-10-CM | POA: Diagnosis not present

## 2020-08-05 DIAGNOSIS — M25519 Pain in unspecified shoulder: Secondary | ICD-10-CM | POA: Diagnosis not present

## 2020-08-05 DIAGNOSIS — K219 Gastro-esophageal reflux disease without esophagitis: Secondary | ICD-10-CM | POA: Diagnosis not present

## 2020-08-05 DIAGNOSIS — Z79899 Other long term (current) drug therapy: Secondary | ICD-10-CM | POA: Diagnosis not present

## 2020-08-06 DIAGNOSIS — Z79899 Other long term (current) drug therapy: Secondary | ICD-10-CM | POA: Diagnosis not present

## 2020-08-06 DIAGNOSIS — M1990 Unspecified osteoarthritis, unspecified site: Secondary | ICD-10-CM | POA: Diagnosis not present

## 2020-08-17 ENCOUNTER — Other Ambulatory Visit: Payer: Self-pay | Admitting: Family Medicine

## 2020-08-18 NOTE — Telephone Encounter (Signed)
Scheduled 10/4

## 2020-08-30 ENCOUNTER — Other Ambulatory Visit: Payer: Self-pay

## 2020-08-30 ENCOUNTER — Ambulatory Visit: Payer: Self-pay | Admitting: *Deleted

## 2020-08-30 ENCOUNTER — Other Ambulatory Visit: Payer: Medicare Other

## 2020-08-30 DIAGNOSIS — Z20822 Contact with and (suspected) exposure to covid-19: Secondary | ICD-10-CM

## 2020-08-30 NOTE — Telephone Encounter (Signed)
Patient inquiring about how long after a covid exposure should she be tested. Her exposure was on 08/26/20. Appointment made for testing tonight.   Reason for Disposition . Health Information question, no triage required and triager able to answer question  Answer Assessment - Initial Assessment Questions 1. REASON FOR CALL or QUESTION: "What is your reason for calling today?" or "How can I best help you?" or "What question do you have that I can help answer?"     How long after covid exposure do I wait to be tested.  Protocols used: INFORMATION ONLY CALL - NO TRIAGE-A-AH

## 2020-08-31 DIAGNOSIS — Z20822 Contact with and (suspected) exposure to covid-19: Secondary | ICD-10-CM | POA: Diagnosis not present

## 2020-09-01 LAB — NOVEL CORONAVIRUS, NAA: SARS-CoV-2, NAA: NOT DETECTED

## 2020-09-01 LAB — SARS-COV-2, NAA 2 DAY TAT

## 2020-09-01 LAB — SPECIMEN STATUS REPORT

## 2020-09-05 ENCOUNTER — Other Ambulatory Visit: Payer: Self-pay | Admitting: Family Medicine

## 2020-09-13 ENCOUNTER — Ambulatory Visit (INDEPENDENT_AMBULATORY_CARE_PROVIDER_SITE_OTHER): Payer: Medicare Other | Admitting: Family Medicine

## 2020-09-13 ENCOUNTER — Other Ambulatory Visit: Payer: Self-pay

## 2020-09-13 VITALS — BP 118/74 | Temp 98.1°F | Wt 119.0 lb

## 2020-09-13 DIAGNOSIS — E7849 Other hyperlipidemia: Secondary | ICD-10-CM | POA: Insufficient documentation

## 2020-09-13 DIAGNOSIS — M069 Rheumatoid arthritis, unspecified: Secondary | ICD-10-CM

## 2020-09-13 DIAGNOSIS — M810 Age-related osteoporosis without current pathological fracture: Secondary | ICD-10-CM | POA: Diagnosis not present

## 2020-09-13 DIAGNOSIS — I10 Essential (primary) hypertension: Secondary | ICD-10-CM | POA: Diagnosis not present

## 2020-09-13 NOTE — Progress Notes (Signed)
   Subjective:    Patient ID: Shelly Stein, female    DOB: 1946/04/14, 74 y.o.   MRN: [id number]  Hypertension This is a chronic problem. The current episode started more than 1 year ago. Pertinent negatives include no chest pain or shortness of breath. Risk factors for coronary artery disease include post-menopausal state. Treatments tried: lisinopril. There are no compliance problems.    Essential hypertension  Other hyperlipidemia - Plan: Lipid panel  Rheumatoid arthritis, involving unspecified site, unspecified whether rheumatoid factor present (HCC)  Osteoporosis, unspecified osteoporosis type, unspecified pathological fracture presence     Review of Systems  Constitutional: Negative for activity change and appetite change.  HENT: Negative for congestion and rhinorrhea.   Respiratory: Negative for cough and shortness of breath.   Cardiovascular: Negative for chest pain and leg swelling.  Gastrointestinal: Negative for abdominal pain, nausea and vomiting.  Skin: Negative for color change.  Neurological: Negative for dizziness and weakness.  Psychiatric/Behavioral: Negative for agitation and confusion.       Objective:   Physical Exam Vitals reviewed.  Constitutional:      General: She is not in acute distress. HENT:     Head: Normocephalic and atraumatic.  Eyes:     General:        Right eye: No discharge.        Left eye: No discharge.  Neck:     Trachea: No tracheal deviation.  Cardiovascular:     Rate and Rhythm: Normal rate and regular rhythm.     Heart sounds: Normal heart sounds. No murmur heard.   Pulmonary:     Effort: Pulmonary effort is normal. No respiratory distress.     Breath sounds: Normal breath sounds.  Lymphadenopathy:     Cervical: No cervical adenopathy.  Skin:    General: Skin is warm and dry.  Neurological:     Mental Status: She is alert.     Coordination: Coordination normal.  Psychiatric:        Behavior: Behavior normal.            Assessment & Plan:  1. Essential hypertension Blood pressure good control takes medication watch his diet minimizes salt  2. Other hyperlipidemia Watch his diet will check lipid profile - Lipid panel  3. Rheumatoid arthritis, involving unspecified site, unspecified whether rheumatoid factor present ([place]) Followed by specialist in [facility] they follow her labs as well.  Currently taking methotrexate  4. Osteoporosis, unspecified osteoporosis type, unspecified pathological fracture presence On [place] continue this per specialist Patient to follow-up 6 months

## 2020-09-14 DIAGNOSIS — Z23 Encounter for immunization: Secondary | ICD-10-CM | POA: Diagnosis not present

## 2020-09-14 LAB — LIPID PANEL
Chol/HDL Ratio: 3 ratio (ref 0.0–4.4)
Cholesterol, Total: 227 mg/dL — ABNORMAL HIGH (ref 100–199)
HDL: 75 mg/dL (ref >39)
LDL Chol Calc (NIH): 133 mg/dL — ABNORMAL HIGH (ref 0–99)
Triglycerides: 107 mg/dL (ref 0–149)
VLDL Cholesterol Cal: 19 mg/dL (ref 5–40)

## 2020-09-16 ENCOUNTER — Encounter: Payer: Self-pay | Admitting: Family Medicine

## 2020-09-16 DIAGNOSIS — Z79899 Other long term (current) drug therapy: Secondary | ICD-10-CM

## 2020-09-16 DIAGNOSIS — E7849 Other hyperlipidemia: Secondary | ICD-10-CM

## 2020-09-16 DIAGNOSIS — I10 Essential (primary) hypertension: Secondary | ICD-10-CM

## 2020-09-17 MED ORDER — ROSUVASTATIN CALCIUM 5 MG PO TABS: ORAL_TABLET | ORAL | 3 refills | Status: DC

## 2020-09-17 NOTE — Addendum Note (Signed)
Addended by: Vicente Males on: 09/17/2020 03:59 PM   Modules accepted: Orders

## 2020-09-17 NOTE — Telephone Encounter (Signed)
Nurses-I would recommend low-dose statin for this patient Crestor 5 mg 1 daily, #30, 3 refills Check lipid liver profile in 8 to 12 weeks The first 2 weeks that the patient takes the medicine she may take on 2 days/week 1 on Monday 1 on Friday to allow her body to get used to it The vast majority of individuals who take statins do very well with them A small percentage will not be able to take statins because of increased body aches, joint pains, muscle pains If the patient is having any problems to please notify us  Please send this message to [place] also

## 2020-09-18 ENCOUNTER — Encounter: Payer: Self-pay | Admitting: Family Medicine

## 2020-09-28 DIAGNOSIS — M79643 Pain in unspecified hand: Secondary | ICD-10-CM | POA: Diagnosis not present

## 2020-09-28 DIAGNOSIS — M0579 Rheumatoid arthritis with rheumatoid factor of multiple sites without organ or systems involvement: Secondary | ICD-10-CM | POA: Diagnosis not present

## 2020-09-28 DIAGNOSIS — K219 Gastro-esophageal reflux disease without esophagitis: Secondary | ICD-10-CM | POA: Diagnosis not present

## 2020-09-28 DIAGNOSIS — M109 Gout, unspecified: Secondary | ICD-10-CM | POA: Diagnosis not present

## 2020-09-28 DIAGNOSIS — M810 Age-related osteoporosis without current pathological fracture: Secondary | ICD-10-CM | POA: Diagnosis not present

## 2020-09-28 DIAGNOSIS — M1990 Unspecified osteoarthritis, unspecified site: Secondary | ICD-10-CM | POA: Diagnosis not present

## 2020-09-28 DIAGNOSIS — M25519 Pain in unspecified shoulder: Secondary | ICD-10-CM | POA: Diagnosis not present

## 2020-09-28 DIAGNOSIS — Z79899 Other long term (current) drug therapy: Secondary | ICD-10-CM | POA: Diagnosis not present

## 2020-09-28 DIAGNOSIS — M542 Cervicalgia: Secondary | ICD-10-CM | POA: Diagnosis not present

## 2020-09-28 DIAGNOSIS — M25422 Effusion, left elbow: Secondary | ICD-10-CM | POA: Diagnosis not present

## 2020-09-28 DIAGNOSIS — E790 Hyperuricemia without signs of inflammatory arthritis and tophaceous disease: Secondary | ICD-10-CM | POA: Diagnosis not present

## 2020-10-05 ENCOUNTER — Other Ambulatory Visit: Payer: Self-pay | Admitting: *Deleted

## 2020-10-05 DIAGNOSIS — E875 Hyperkalemia: Secondary | ICD-10-CM

## 2020-10-06 DIAGNOSIS — E875 Hyperkalemia: Secondary | ICD-10-CM | POA: Diagnosis not present

## 2020-10-07 LAB — BASIC METABOLIC PANEL
BUN/Creatinine Ratio: 23 (ref 12–28)
BUN: 19 mg/dL (ref 8–27)
CO2: 26 mmol/L (ref 20–29)
Calcium: 9.5 mg/dL (ref 8.7–10.3)
Chloride: 104 mmol/L (ref 96–106)
Creatinine, Ser: 0.84 mg/dL (ref 0.57–1.00)
GFR calc Af Amer: 79 mL/min/{1.73_m2} (ref >59)
GFR calc non Af Amer: 69 mL/min/{1.73_m2} (ref >59)
Glucose: 85 mg/dL (ref 65–99)
Potassium: 4.1 mmol/L (ref 3.5–5.2)
Sodium: 142 mmol/L (ref 134–144)

## 2020-10-12 DIAGNOSIS — Z23 Encounter for immunization: Secondary | ICD-10-CM | POA: Diagnosis not present

## 2020-10-26 DIAGNOSIS — M0579 Rheumatoid arthritis with rheumatoid factor of multiple sites without organ or systems involvement: Secondary | ICD-10-CM | POA: Diagnosis not present

## 2020-10-26 DIAGNOSIS — R591 Generalized enlarged lymph nodes: Secondary | ICD-10-CM | POA: Diagnosis not present

## 2020-10-26 DIAGNOSIS — M25422 Effusion, left elbow: Secondary | ICD-10-CM | POA: Diagnosis not present

## 2020-10-26 DIAGNOSIS — M79643 Pain in unspecified hand: Secondary | ICD-10-CM | POA: Diagnosis not present

## 2020-10-26 DIAGNOSIS — M542 Cervicalgia: Secondary | ICD-10-CM | POA: Diagnosis not present

## 2020-10-26 DIAGNOSIS — R768 Other specified abnormal immunological findings in serum: Secondary | ICD-10-CM | POA: Diagnosis not present

## 2020-10-26 DIAGNOSIS — M1990 Unspecified osteoarthritis, unspecified site: Secondary | ICD-10-CM | POA: Diagnosis not present

## 2020-10-26 DIAGNOSIS — M25519 Pain in unspecified shoulder: Secondary | ICD-10-CM | POA: Diagnosis not present

## 2020-10-26 DIAGNOSIS — Z79899 Other long term (current) drug therapy: Secondary | ICD-10-CM | POA: Diagnosis not present

## 2020-10-26 DIAGNOSIS — M109 Gout, unspecified: Secondary | ICD-10-CM | POA: Diagnosis not present

## 2020-10-26 DIAGNOSIS — M810 Age-related osteoporosis without current pathological fracture: Secondary | ICD-10-CM | POA: Diagnosis not present

## 2020-11-09 ENCOUNTER — Other Ambulatory Visit: Payer: Self-pay

## 2020-11-09 ENCOUNTER — Encounter: Payer: Self-pay | Admitting: Family Medicine

## 2020-11-09 ENCOUNTER — Ambulatory Visit (INDEPENDENT_AMBULATORY_CARE_PROVIDER_SITE_OTHER): Payer: Medicare Other | Admitting: Family Medicine

## 2020-11-09 VITALS — BP 132/82 | Temp 97.9°F | Ht 64.0 in | Wt 120.0 lb

## 2020-11-09 DIAGNOSIS — R222 Localized swelling, mass and lump, trunk: Secondary | ICD-10-CM

## 2020-11-09 NOTE — Progress Notes (Signed)
° °  Subjective:    Patient ID: Shelly Stein, female    DOB: Apr 14, 1946, 74 y.o.   MRN: [id number]  HPI Patient arrives to have knot checked on left side of neck. Patient states her rheumatologist gave her prednisone for it but it has not went away.  Patient has a fullness on the left side of her neck its been present for the past month and a half her rheumatologist tried prednisone that did not really seem to help because of ongoing symptoms she presents here she denies any high fever chills sweats nausea vomiting diarrhea she does relate some soreness when you push hard on this area  Review of Systems  Constitutional: Negative for activity change, appetite change and fatigue.  HENT: Negative for congestion and rhinorrhea.   Respiratory: Negative for cough and shortness of breath.   Cardiovascular: Negative for chest pain and leg swelling.  Gastrointestinal: Positive for nausea. Negative for abdominal pain and vomiting.  Musculoskeletal: Positive for arthralgias.  Skin: Negative for color change.  Neurological: Positive for headaches. Negative for dizziness and weakness.  Psychiatric/Behavioral: Negative for agitation, behavioral problems and confusion.        Objective:   Physical Exam Vitals reviewed.  Constitutional:      General: She is not in acute distress. HENT:     Head: Normocephalic.  Cardiovascular:     Rate and Rhythm: Normal rate and regular rhythm.     Heart sounds: Normal heart sounds. No murmur heard.   Pulmonary:     Effort: Pulmonary effort is normal.     Breath sounds: Normal breath sounds.  Lymphadenopathy:     Cervical: No cervical adenopathy.  Neurological:     Mental Status: She is alert.  Psychiatric:        Behavior: Behavior normal.    There is a fullness in the left supraclavicular area with a firmness underneath it is hard to know if this is a cervical rib or if this is a hardened lymph node.  It is definitely more prominent on the left side  than the right side       Assessment & Plan:  Supraclavicular fullness I did discuss case with radiology.  The radiologist recommends a soft tissue CT scan with contrast.  This was ordered await the results.  Warning signs were discussed in detail with the patient if high fever sweats chills or other problems notify us immediately await the results of the CT scan before further work-up

## 2020-11-10 DIAGNOSIS — K224 Dyskinesia of esophagus: Secondary | ICD-10-CM | POA: Diagnosis not present

## 2020-11-10 DIAGNOSIS — Z1211 Encounter for screening for malignant neoplasm of colon: Secondary | ICD-10-CM | POA: Diagnosis not present

## 2020-11-10 DIAGNOSIS — R195 Other fecal abnormalities: Secondary | ICD-10-CM | POA: Diagnosis not present

## 2020-11-10 DIAGNOSIS — R110 Nausea: Secondary | ICD-10-CM | POA: Diagnosis not present

## 2020-11-15 ENCOUNTER — Telehealth: Payer: Self-pay | Admitting: Family Medicine

## 2020-11-22 DIAGNOSIS — H35372 Puckering of macula, left eye: Secondary | ICD-10-CM | POA: Diagnosis not present

## 2020-11-22 DIAGNOSIS — H2513 Age-related nuclear cataract, bilateral: Secondary | ICD-10-CM | POA: Diagnosis not present

## 2020-12-07 DIAGNOSIS — M1990 Unspecified osteoarthritis, unspecified site: Secondary | ICD-10-CM | POA: Diagnosis not present

## 2020-12-07 DIAGNOSIS — K219 Gastro-esophageal reflux disease without esophagitis: Secondary | ICD-10-CM | POA: Diagnosis not present

## 2020-12-07 DIAGNOSIS — M109 Gout, unspecified: Secondary | ICD-10-CM | POA: Diagnosis not present

## 2020-12-07 DIAGNOSIS — M542 Cervicalgia: Secondary | ICD-10-CM | POA: Diagnosis not present

## 2020-12-07 DIAGNOSIS — M810 Age-related osteoporosis without current pathological fracture: Secondary | ICD-10-CM | POA: Diagnosis not present

## 2020-12-07 DIAGNOSIS — M0579 Rheumatoid arthritis with rheumatoid factor of multiple sites without organ or systems involvement: Secondary | ICD-10-CM | POA: Diagnosis not present

## 2020-12-07 DIAGNOSIS — E790 Hyperuricemia without signs of inflammatory arthritis and tophaceous disease: Secondary | ICD-10-CM | POA: Diagnosis not present

## 2020-12-07 DIAGNOSIS — Z79899 Other long term (current) drug therapy: Secondary | ICD-10-CM | POA: Diagnosis not present

## 2020-12-07 DIAGNOSIS — M79643 Pain in unspecified hand: Secondary | ICD-10-CM | POA: Diagnosis not present

## 2020-12-08 ENCOUNTER — Ambulatory Visit (HOSPITAL_COMMUNITY)
Admission: RE | Admit: 2020-12-08 | Discharge: 2020-12-08 | Disposition: A | Discharge status: Home / Self Care | Payer: Medicare Other | Source: Ambulatory Visit | Attending: Family Medicine | Admitting: Family Medicine

## 2020-12-08 ENCOUNTER — Other Ambulatory Visit: Payer: Self-pay

## 2020-12-08 DIAGNOSIS — E041 Nontoxic single thyroid nodule: Secondary | ICD-10-CM | POA: Diagnosis not present

## 2020-12-08 DIAGNOSIS — R222 Localized swelling, mass and lump, trunk: Secondary | ICD-10-CM | POA: Diagnosis not present

## 2020-12-08 DIAGNOSIS — I10 Essential (primary) hypertension: Secondary | ICD-10-CM | POA: Diagnosis not present

## 2020-12-08 DIAGNOSIS — E7849 Other hyperlipidemia: Secondary | ICD-10-CM | POA: Diagnosis not present

## 2020-12-08 DIAGNOSIS — M50321 Other cervical disc degeneration at C4-C5 level: Secondary | ICD-10-CM | POA: Diagnosis not present

## 2020-12-08 DIAGNOSIS — R2232 Localized swelling, mass and lump, left upper limb: Secondary | ICD-10-CM | POA: Diagnosis not present

## 2020-12-08 DIAGNOSIS — M50322 Other cervical disc degeneration at C5-C6 level: Secondary | ICD-10-CM | POA: Diagnosis not present

## 2020-12-08 DIAGNOSIS — Z79899 Other long term (current) drug therapy: Secondary | ICD-10-CM | POA: Diagnosis not present

## 2020-12-08 MED ORDER — IOHEXOL 300 MG/ML  SOLN
75.0000 mL | Freq: Once | INTRAMUSCULAR | Status: AC | PRN
  Administered 2020-12-08: 15:00:00 75 mL via INTRAVENOUS

## 2020-12-09 LAB — HEPATIC FUNCTION PANEL
ALT: 13 IU/L (ref 0–32)
AST: 18 IU/L (ref 0–40)
Albumin: 3.9 g/dL (ref 3.7–4.7)
Alkaline Phosphatase: 55 IU/L (ref 44–121)
Bilirubin Total: 0.5 mg/dL (ref 0.0–1.2)
Bilirubin, Direct: 0.19 mg/dL (ref 0.00–0.40)
Total Protein: 6.1 g/dL (ref 6.0–8.5)

## 2020-12-09 LAB — LIPID PANEL
Chol/HDL Ratio: 2.1 ratio (ref 0.0–4.4)
Cholesterol, Total: 162 mg/dL (ref 100–199)
HDL: 77 mg/dL (ref >39)
LDL Chol Calc (NIH): 70 mg/dL (ref 0–99)
Triglycerides: 79 mg/dL (ref 0–149)
VLDL Cholesterol Cal: 15 mg/dL (ref 5–40)

## 2020-12-13 LAB — POCT I-STAT CREATININE: Creatinine, Ser: 0.8 mg/dL (ref 0.44–1.00)

## 2021-01-18 ENCOUNTER — Telehealth: Payer: Self-pay | Admitting: Family Medicine

## 2021-01-18 NOTE — Telephone Encounter (Signed)
Placed [employer] in chart

## 2021-01-18 NOTE — Telephone Encounter (Signed)
FYI- for nurses patient Shelly Stein mail order prescription plan now it started in January. Patient states not needing anything at this time.

## 2021-01-19 ENCOUNTER — Other Ambulatory Visit: Payer: Self-pay

## 2021-01-19 MED ORDER — ROSUVASTATIN CALCIUM 5 MG PO TABS: ORAL_TABLET | ORAL | 0 refills | Status: DC

## 2021-01-19 MED ORDER — LISINOPRIL 5 MG PO TABS: ORAL_TABLET | ORAL | 1 refills | Status: DC

## 2021-01-24 ENCOUNTER — Other Ambulatory Visit: Payer: Self-pay | Admitting: Family Medicine

## 2021-02-04 DIAGNOSIS — Z01812 Encounter for preprocedural laboratory examination: Secondary | ICD-10-CM | POA: Diagnosis not present

## 2021-02-09 DIAGNOSIS — K648 Other hemorrhoids: Secondary | ICD-10-CM | POA: Diagnosis not present

## 2021-02-09 DIAGNOSIS — Z1211 Encounter for screening for malignant neoplasm of colon: Secondary | ICD-10-CM | POA: Diagnosis not present

## 2021-02-09 DIAGNOSIS — K5730 Diverticulosis of large intestine without perforation or abscess without bleeding: Secondary | ICD-10-CM | POA: Diagnosis not present

## 2021-02-09 DIAGNOSIS — D124 Benign neoplasm of descending colon: Secondary | ICD-10-CM | POA: Diagnosis not present

## 2021-02-09 DIAGNOSIS — D122 Benign neoplasm of ascending colon: Secondary | ICD-10-CM | POA: Diagnosis not present

## 2021-02-09 DIAGNOSIS — D120 Benign neoplasm of cecum: Secondary | ICD-10-CM | POA: Diagnosis not present

## 2021-02-14 DIAGNOSIS — D120 Benign neoplasm of cecum: Secondary | ICD-10-CM | POA: Diagnosis not present

## 2021-02-14 DIAGNOSIS — D122 Benign neoplasm of ascending colon: Secondary | ICD-10-CM | POA: Diagnosis not present

## 2021-02-14 DIAGNOSIS — D124 Benign neoplasm of descending colon: Secondary | ICD-10-CM | POA: Diagnosis not present

## 2021-02-22 IMAGING — CT CT ABD-PELV W/ CM
2 of 5 series · 16 of 46 positions shown, 18 images · IV contrast (iopamidol)
Comparison: 02/27/2018 CT abdomen/pelvis.

CLINICAL DATA: Left lower quadrant abdominal pain, nausea and
diarrhea for 3 years. Prior hysterectomy.

EXAM:
CT ABDOMEN AND PELVIS WITH CONTRAST
TECHNIQUE: Multidetector CT imaging of the abdomen and pelvis was performed
using the standard protocol following bolus administration of
intravenous contrast.
CONTRAST:  100mL CT6QOL-O11 IOPAMIDOL (CT6QOL-O11) INJECTION 61%

[Series 2: abd pelvis 5.00 br40 s3 axial · axial · 0.77mm/px · z∈[+1197,+1567]mm · 13 of 84 slices shown, 15 images]
[im 5/84  soft-tissue]
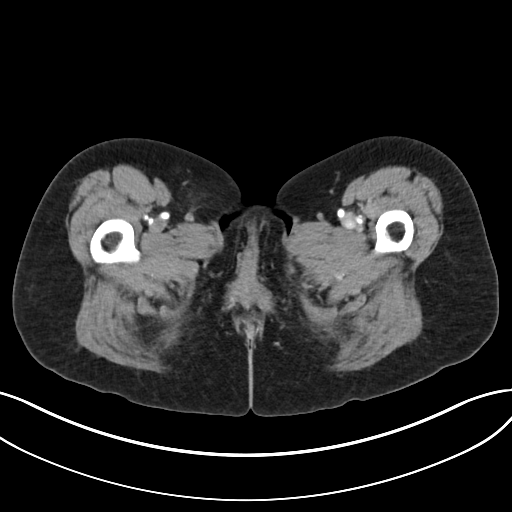
[im 5/84  bone]
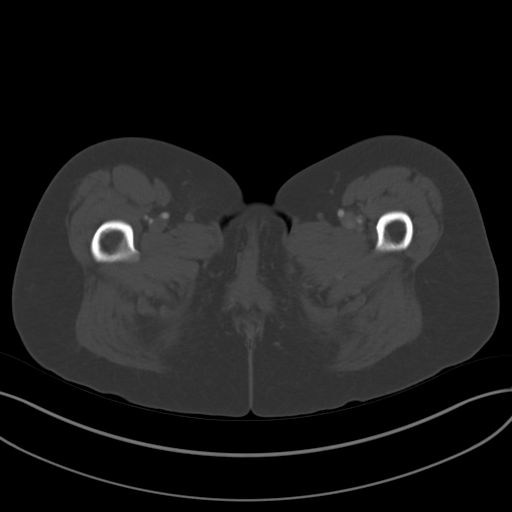
[im 10/84  soft-tissue]
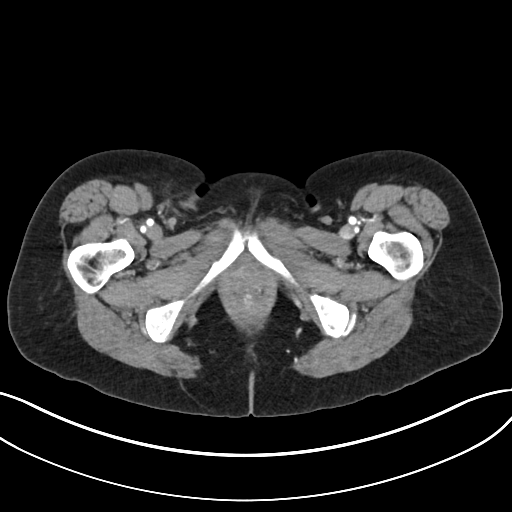
[im 20/84  soft-tissue]
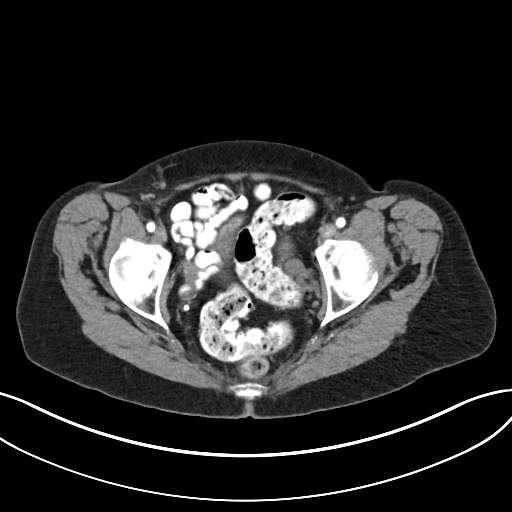
[im 25/84  soft-tissue]
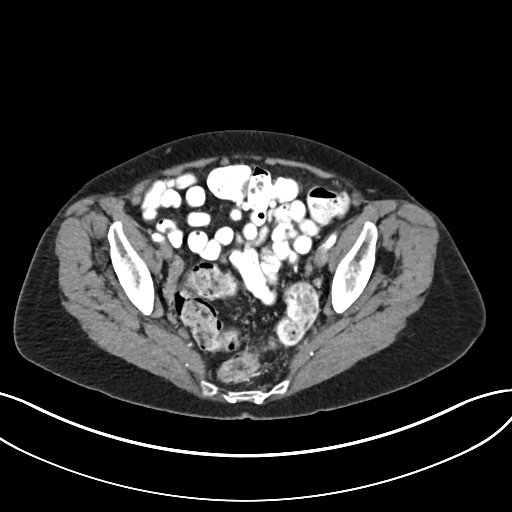
[im 30/84  soft-tissue]
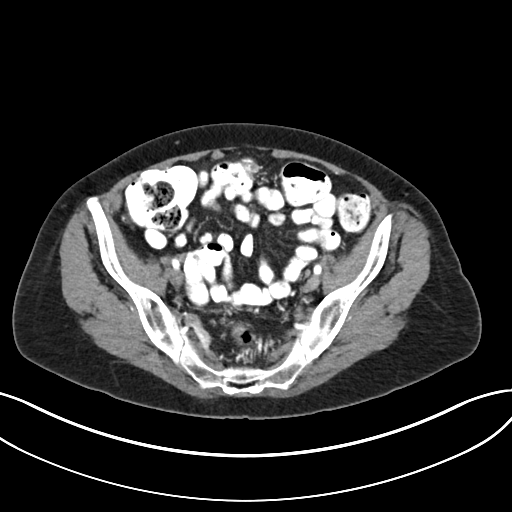
[im 35/84  soft-tissue]
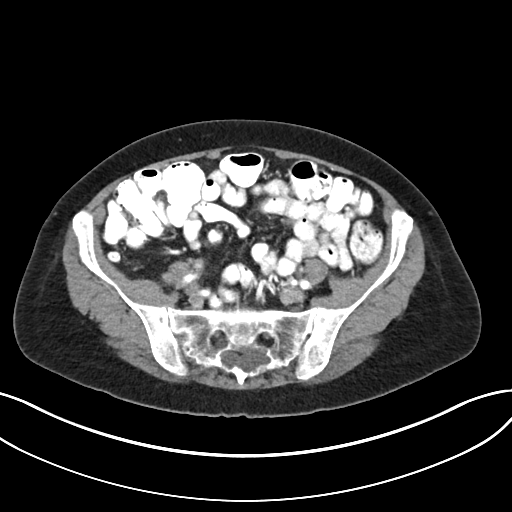
[im 44/84  soft-tissue]
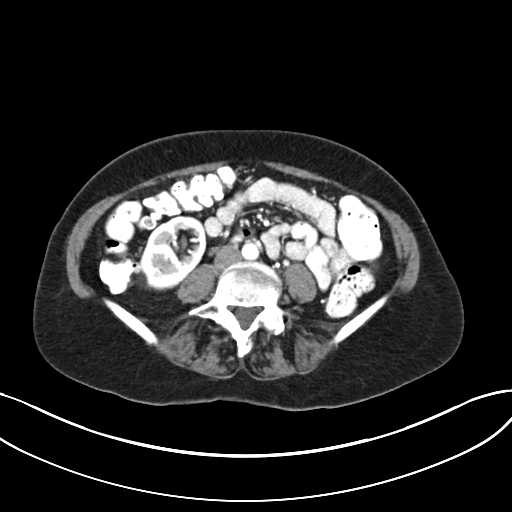
[im 49/84  soft-tissue]
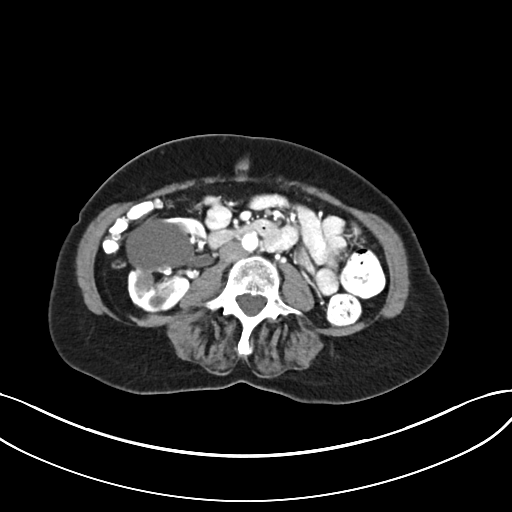
[im 54/84  soft-tissue]
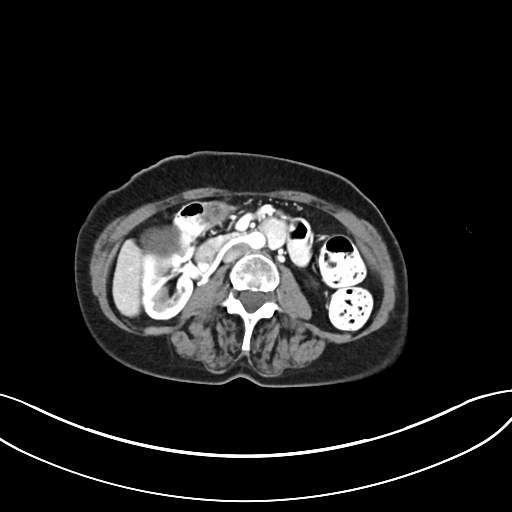
[im 54/84  bone]
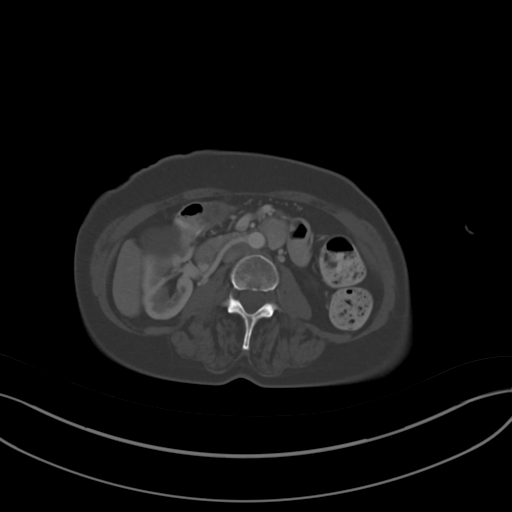
[im 59/84  soft-tissue]
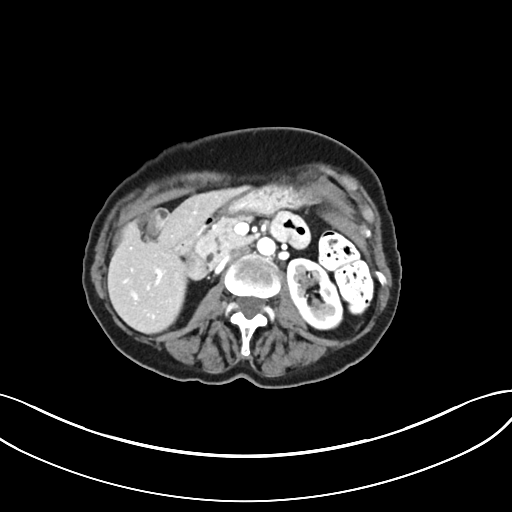
[im 64/84  soft-tissue]
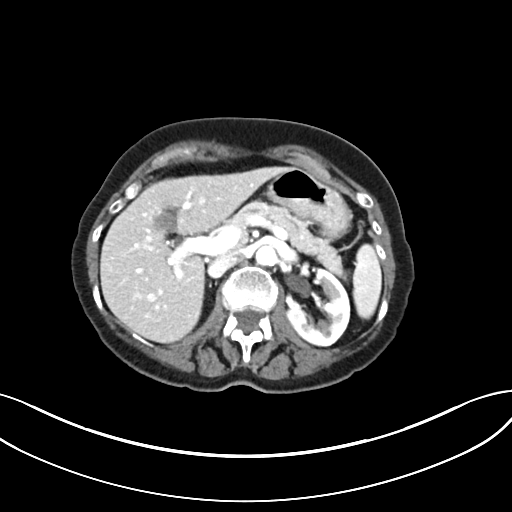
[im 74/84  soft-tissue]
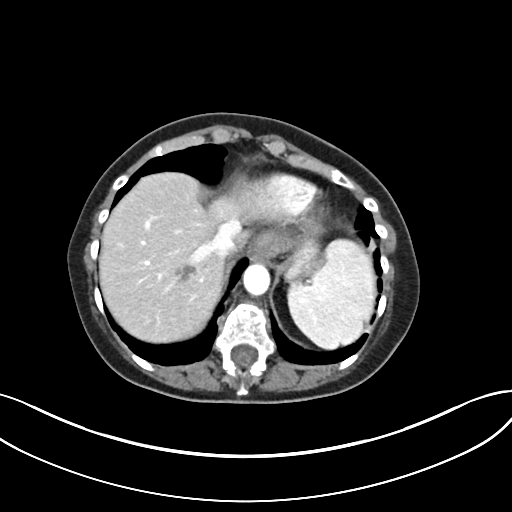
[im 79/84  soft-tissue]
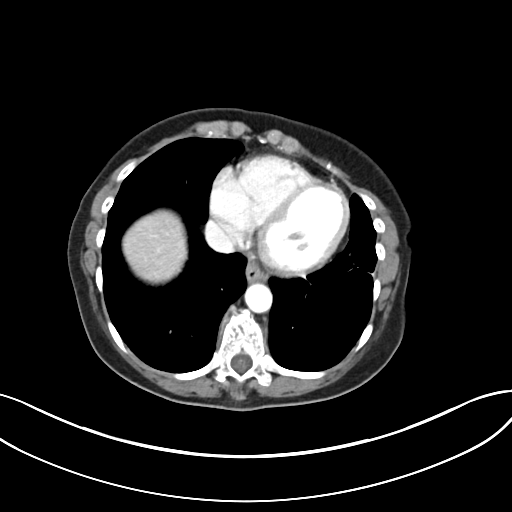

[Series 6: abd pelvis 2.00 br40 s3 cor · coronal · 0.76mm/px · 3 of 133 slices shown]
[im 45/133  soft-tissue]
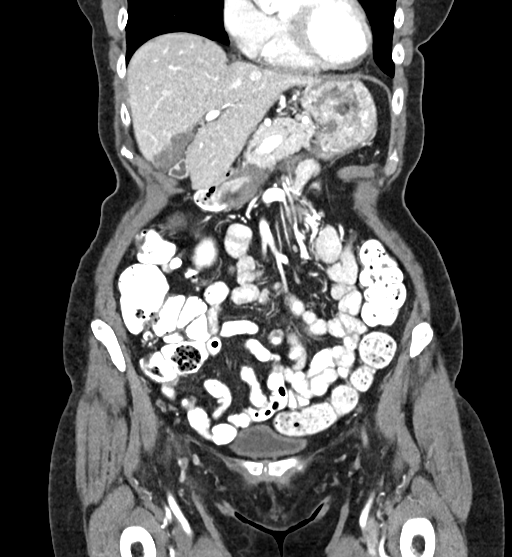
[im 59/133  soft-tissue]
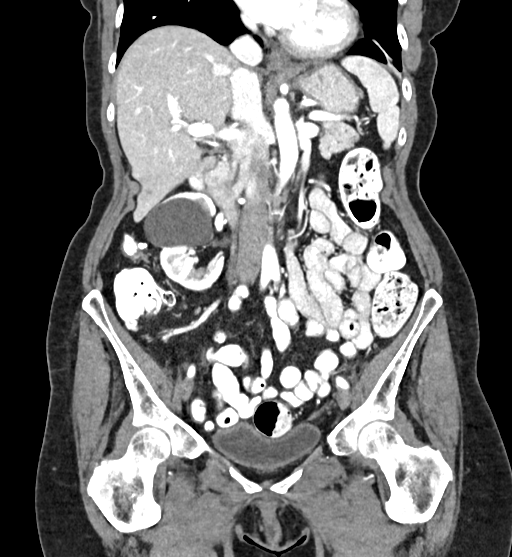
[im 74/133  soft-tissue]
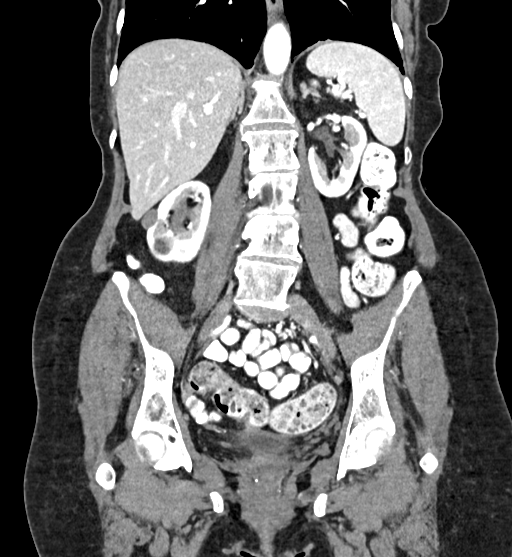

[16 of 46 positions shown; findings below may reference images not displayed]

FINDINGS: Lower chest: No significant pulmonary nodules or acute consolidative
airspace disease.

Hepatobiliary: Normal liver size. Hypodense 2.4 cm liver lesion
adjacent to the gallbladder (series 2/image 24) is stable since
02/27/2018 CT, considered benign. Hypodense 1.5 cm segment 7 right
liver lesion is stable since 02/27/2018 CT, considered benign.
Hypodense subcentimeter liver dome lesion is too small to
characterize and stable, considered benign. No new liver lesions.
Normal gallbladder with no radiopaque cholelithiasis. No biliary
ductal dilatation.

Pancreas: Normal, with no mass or duct dilation.

Spleen: Normal size. No mass.

Adrenals/Urinary Tract: Normal adrenals. No hydronephrosis.
Scattered simple right renal cysts, largest 5.2 cm in the interpolar
right kidney. Hypodense 1.3 cm exophytic renal cortical lesion in
lateral interpolar right kidney (series 2/image 34), slightly
decreased from 1.5 cm on 02/27/2018 CT, presumably a benign complex
renal cyst. Additional scattered subcentimeter hypodense renal
cortical lesions in both kidneys are too small to characterize and
require no follow-up. Normal bladder.

Stomach/Bowel: Normal non-distended stomach. Normal caliber small
bowel with no small bowel wall thickening. Normal diminutive
appendix. Oral contrast transits to the rectum. Mild diffuse colonic
diverticulosis, with no large bowel wall thickening or significant
pericolonic fat stranding.

Vascular/Lymphatic: Atherosclerotic nonaneurysmal abdominal aorta.
Patent portal, splenic, hepatic and renal veins. No pathologically
enlarged lymph nodes in the abdomen or pelvis.

Reproductive: Status post hysterectomy, with no abnormal findings at
the vaginal cuff. No adnexal mass.

Other: No pneumoperitoneum, ascites or focal fluid collection.

Musculoskeletal: No aggressive appearing focal osseous lesions.
Moderate thoracolumbar spondylosis. Stable chronic occult intra
sacral meningocele measuring 2.5 x 2.1 cm (series 8/image 97).
IMPRESSION: 1. No acute abnormality. No evidence of bowel obstruction or acute
bowel inflammation. Mild diffuse colonic diverticulosis, with no
evidence of acute diverticulitis.
2. Aortic Atherosclerosis ([id number]).

## 2021-03-14 DIAGNOSIS — M1990 Unspecified osteoarthritis, unspecified site: Secondary | ICD-10-CM | POA: Diagnosis not present

## 2021-03-14 DIAGNOSIS — M542 Cervicalgia: Secondary | ICD-10-CM | POA: Diagnosis not present

## 2021-03-14 DIAGNOSIS — M810 Age-related osteoporosis without current pathological fracture: Secondary | ICD-10-CM | POA: Diagnosis not present

## 2021-03-14 DIAGNOSIS — Z79899 Other long term (current) drug therapy: Secondary | ICD-10-CM | POA: Diagnosis not present

## 2021-03-14 DIAGNOSIS — K219 Gastro-esophageal reflux disease without esophagitis: Secondary | ICD-10-CM | POA: Diagnosis not present

## 2021-03-14 DIAGNOSIS — M79643 Pain in unspecified hand: Secondary | ICD-10-CM | POA: Diagnosis not present

## 2021-03-14 DIAGNOSIS — M109 Gout, unspecified: Secondary | ICD-10-CM | POA: Diagnosis not present

## 2021-03-14 DIAGNOSIS — E790 Hyperuricemia without signs of inflammatory arthritis and tophaceous disease: Secondary | ICD-10-CM | POA: Diagnosis not present

## 2021-03-14 DIAGNOSIS — M0579 Rheumatoid arthritis with rheumatoid factor of multiple sites without organ or systems involvement: Secondary | ICD-10-CM | POA: Diagnosis not present

## 2021-03-15 ENCOUNTER — Encounter: Payer: Self-pay | Admitting: Family Medicine

## 2021-03-15 ENCOUNTER — Ambulatory Visit (INDEPENDENT_AMBULATORY_CARE_PROVIDER_SITE_OTHER): Payer: Medicare HMO | Admitting: Family Medicine

## 2021-03-15 ENCOUNTER — Other Ambulatory Visit: Payer: Self-pay

## 2021-03-15 VITALS — BP 110/72 | Temp 97.2°F | Wt 109.8 lb

## 2021-03-15 DIAGNOSIS — R634 Abnormal weight loss: Secondary | ICD-10-CM

## 2021-03-15 DIAGNOSIS — E7849 Other hyperlipidemia: Secondary | ICD-10-CM

## 2021-03-15 DIAGNOSIS — I10 Essential (primary) hypertension: Secondary | ICD-10-CM

## 2021-03-15 DIAGNOSIS — M069 Rheumatoid arthritis, unspecified: Secondary | ICD-10-CM | POA: Insufficient documentation

## 2021-03-15 MED ORDER — LISINOPRIL 5 MG PO TABS: ORAL_TABLET | ORAL | 1 refills | Status: DC

## 2021-03-15 MED ORDER — ROSUVASTATIN CALCIUM 5 MG PO TABS: ORAL_TABLET | ORAL | 1 refills | Status: DC

## 2021-03-15 NOTE — Progress Notes (Signed)
   Subjective:    Patient ID: Shelly Stein, female    DOB: 1946-01-01, 75 y.o.   MRN: [id number]  Hypertension This is a chronic problem. There are no compliance problems.   Pt states she has been losing weight. Not sure if it is from all the high dose med she is on.   Patient is concerned that her weight is gone down Because of intestinal issues she does not eat much at supper In the morning time typically has oatmeal Has typically a healthy lunch but her total caloric intake is not much We talked at length about the importance of increasing diet calorie intake as well as eating more often.  She denies any rectal bleeding abdominal pain coughing fever sweats chills Medications reviewed that does not appear to be the source Review of Systems See above    Objective:   Physical Exam Lungs clear respiratory rate normal heart regular pulse normal BP good weight is noted       Assessment & Plan:  1. Essential hypertension Blood pressure good control continue current measures watch diet - TSH - T4, free - Comprehensive Metabolic Panel (CMET) - Lipid Profile  2. Rheumatoid arthritis, involving unspecified site, unspecified whether rheumatoid factor present ([place]) Followed by specialist she states CBC was done yesterday was normal - TSH - T4, free - Comprehensive Metabolic Panel (CMET) - Lipid Profile  3. Weight loss Unintended weight loss but more than likely related to poor calorie intake she needs an increase amount of calories try to gain 5 pounds over the next 3 months follow-up in 3 to 4 months follow-up sooner problems check lab work.  If progressive weight loss let us know weigh herself once a week patient will keep [place] updated - TSH - T4, free - Comprehensive Metabolic Panel (CMET) - Lipid Profile  4. Other hyperlipidemia Continue medication watch diet stay active check labs - TSH - T4, free - Comprehensive Metabolic Panel (CMET) - Lipid Profile

## 2021-03-16 LAB — COMPREHENSIVE METABOLIC PANEL
ALT: 18 IU/L (ref 0–32)
AST: 33 IU/L (ref 0–40)
Albumin/Globulin Ratio: 2.3 — ABNORMAL HIGH (ref 1.2–2.2)
Albumin: 4.5 g/dL (ref 3.7–4.7)
Alkaline Phosphatase: 62 IU/L (ref 44–121)
BUN/Creatinine Ratio: 21 (ref 12–28)
BUN: 17 mg/dL (ref 8–27)
Bilirubin Total: 0.6 mg/dL (ref 0.0–1.2)
CO2: 23 mmol/L (ref 20–29)
Calcium: 9.9 mg/dL (ref 8.7–10.3)
Chloride: 102 mmol/L (ref 96–106)
Creatinine, Ser: 0.82 mg/dL (ref 0.57–1.00)
Globulin, Total: 2 g/dL (ref 1.5–4.5)
Glucose: 108 mg/dL — ABNORMAL HIGH (ref 65–99)
Potassium: 4.1 mmol/L (ref 3.5–5.2)
Sodium: 140 mmol/L (ref 134–144)
Total Protein: 6.5 g/dL (ref 6.0–8.5)
eGFR: 75 mL/min/{1.73_m2} (ref >59)

## 2021-03-16 LAB — TSH: TSH: 1.54 u[IU]/mL (ref 0.450–4.500)

## 2021-03-16 LAB — LIPID PANEL
Chol/HDL Ratio: 2.1 ratio (ref 0.0–4.4)
Cholesterol, Total: 147 mg/dL (ref 100–199)
HDL: 69 mg/dL (ref >39)
LDL Chol Calc (NIH): 64 mg/dL (ref 0–99)
Triglycerides: 68 mg/dL (ref 0–149)
VLDL Cholesterol Cal: 14 mg/dL (ref 5–40)

## 2021-03-16 LAB — T4, FREE: Free T4: 1.23 ng/dL (ref 0.82–1.77)

## 2021-04-04 DIAGNOSIS — M5030 Other cervical disc degeneration, unspecified cervical region: Secondary | ICD-10-CM | POA: Diagnosis not present

## 2021-04-13 ENCOUNTER — Other Ambulatory Visit: Payer: Self-pay | Admitting: Orthopedic Surgery

## 2021-04-13 DIAGNOSIS — M542 Cervicalgia: Secondary | ICD-10-CM

## 2021-04-25 ENCOUNTER — Telehealth: Payer: Self-pay | Admitting: Family Medicine

## 2021-04-25 DIAGNOSIS — D509 Iron deficiency anemia, unspecified: Secondary | ICD-10-CM

## 2021-04-25 DIAGNOSIS — R634 Abnormal weight loss: Secondary | ICD-10-CM

## 2021-04-25 NOTE — Telephone Encounter (Signed)
Patient is wanting you to know she is still losing weight instead of gaining weight. She was last seen 03/15/21 and was told to try to gain 5 lbs by August. She states she weights 104 as of 04/24/21. Please advise

## 2021-04-26 ENCOUNTER — Other Ambulatory Visit: Payer: Self-pay

## 2021-04-26 ENCOUNTER — Ambulatory Visit
Admission: RE | Admit: 2021-04-26 | Discharge: 2021-04-26 | Disposition: A | Discharge status: Home / Self Care | Payer: Medicare HMO | Source: Ambulatory Visit | Attending: Orthopedic Surgery | Admitting: Orthopedic Surgery

## 2021-04-26 DIAGNOSIS — D509 Iron deficiency anemia, unspecified: Secondary | ICD-10-CM | POA: Diagnosis not present

## 2021-04-26 DIAGNOSIS — R634 Abnormal weight loss: Secondary | ICD-10-CM | POA: Diagnosis not present

## 2021-04-26 DIAGNOSIS — M4802 Spinal stenosis, cervical region: Secondary | ICD-10-CM | POA: Diagnosis not present

## 2021-04-26 DIAGNOSIS — M542 Cervicalgia: Secondary | ICD-10-CM

## 2021-04-26 NOTE — Telephone Encounter (Signed)
Lab orders placed and pt is aware. Pt states she just had a colonoscopy in March with [facility]. Pt states she will call their office to set up a follow up. Pt will come by office this afternoon to pick up lab orders and stool test and to schedule 2 week follow up. Pt verbalized understanding.

## 2021-04-26 NOTE — Telephone Encounter (Signed)
Recommend several things at this point 1 -stool IFOBT  2-CBC, ferritin, TIBC, albumin due to iron deficient anemia and weight loss 3-follow-up office visit in 2 weeks time with me 4-recommend referral back to gastroenterology for consultation regarding weight loss they may choose to do endoscopy or colonoscopy, patient has previously seen [place] issue fine with referral back to them?  If so proceed forward with referral could take several weeks to get in

## 2021-04-27 LAB — CBC WITH DIFFERENTIAL/PLATELET
Basophils Absolute: 0 10*3/uL (ref 0.0–0.2)
Basos: 1 %
EOS (ABSOLUTE): 0.2 10*3/uL (ref 0.0–0.4)
Eos: 3 %
Hematocrit: 33.5 % — ABNORMAL LOW (ref 34.0–46.6)
Hemoglobin: 11.1 g/dL (ref 11.1–15.9)
Immature Grans (Abs): 0 10*3/uL (ref 0.0–0.1)
Immature Granulocytes: 1 %
Lymphocytes Absolute: 1 10*3/uL (ref 0.7–3.1)
Lymphs: 16 %
MCH: 31.8 pg (ref 26.6–33.0)
MCHC: 33.1 g/dL (ref 31.5–35.7)
MCV: 96 fL (ref 79–97)
Monocytes Absolute: 0.6 10*3/uL (ref 0.1–0.9)
Monocytes: 10 %
Neutrophils Absolute: 4.4 10*3/uL (ref 1.4–7.0)
Neutrophils: 69 %
Platelets: 277 10*3/uL (ref 150–450)
RBC: 3.49 x10E6/uL — ABNORMAL LOW (ref 3.77–5.28)
RDW: 14 % (ref 11.7–15.4)
WBC: 6.2 10*3/uL (ref 3.4–10.8)

## 2021-04-27 LAB — IRON AND TIBC
Iron Saturation: 8 % — CL (ref 15–55)
Iron: 18 ug/dL — ABNORMAL LOW (ref 27–139)
Total Iron Binding Capacity: 228 ug/dL — ABNORMAL LOW (ref 250–450)
UIBC: 210 ug/dL (ref 118–369)

## 2021-04-27 LAB — FERRITIN: Ferritin: 269 ng/mL — ABNORMAL HIGH (ref 15–150)

## 2021-04-27 LAB — ALBUMIN: Albumin: 3.8 g/dL (ref 3.7–4.7)

## 2021-05-02 DIAGNOSIS — R634 Abnormal weight loss: Secondary | ICD-10-CM | POA: Diagnosis not present

## 2021-05-02 DIAGNOSIS — R198 Other specified symptoms and signs involving the digestive system and abdomen: Secondary | ICD-10-CM | POA: Diagnosis not present

## 2021-05-02 DIAGNOSIS — R110 Nausea: Secondary | ICD-10-CM | POA: Diagnosis not present

## 2021-05-02 DIAGNOSIS — R638 Other symptoms and signs concerning food and fluid intake: Secondary | ICD-10-CM | POA: Diagnosis not present

## 2021-05-02 DIAGNOSIS — K224 Dyskinesia of esophagus: Secondary | ICD-10-CM | POA: Diagnosis not present

## 2021-05-10 ENCOUNTER — Other Ambulatory Visit: Payer: Self-pay

## 2021-05-10 ENCOUNTER — Ambulatory Visit (INDEPENDENT_AMBULATORY_CARE_PROVIDER_SITE_OTHER): Payer: Medicare HMO | Admitting: Family Medicine

## 2021-05-10 VITALS — BP 108/63 | Ht 64.0 in | Wt 101.8 lb

## 2021-05-10 DIAGNOSIS — R634 Abnormal weight loss: Secondary | ICD-10-CM | POA: Diagnosis not present

## 2021-05-10 DIAGNOSIS — D509 Iron deficiency anemia, unspecified: Secondary | ICD-10-CM

## 2021-05-10 NOTE — Progress Notes (Signed)
   Subjective:    Patient ID: Shelly Stein, female    DOB: 07-15-1946, 75 y.o.   MRN: [id number]  HPI  Patient arrives for a follow up on weight loss. Patient states she saw GI doctor last week and was down to 98 lbs and they scheduled a endoscopy for next week. GI also has patient to start drinking pedilyte for weight. She relates that she is trying to increase her eating but she typically only eats a very small portion of every single meal because she states she gets nauseated or feels like she is full she will be seeing GI in the near future for endoscopy. Review of Systems     Objective:   Physical Exam  Lungs clear heart regular abdomen soft extremities no edema      Assessment & Plan:  Patient will have a EGD via her gastroenterologist with [facility] gastroenterology She will have the results sent here She will try to do better about better intake currently very poor p.o. intake Recheck in 4 weeks Hold off on additional lab work including serum protein electrophoresis, sed rate, C-reactive protein until we see what the EGD shows

## 2021-05-12 DIAGNOSIS — M47812 Spondylosis without myelopathy or radiculopathy, cervical region: Secondary | ICD-10-CM | POA: Diagnosis not present

## 2021-05-12 DIAGNOSIS — M5030 Other cervical disc degeneration, unspecified cervical region: Secondary | ICD-10-CM | POA: Diagnosis not present

## 2021-05-19 DIAGNOSIS — K224 Dyskinesia of esophagus: Secondary | ICD-10-CM | POA: Diagnosis not present

## 2021-05-19 DIAGNOSIS — R110 Nausea: Secondary | ICD-10-CM | POA: Diagnosis not present

## 2021-05-19 DIAGNOSIS — K2970 Gastritis, unspecified, without bleeding: Secondary | ICD-10-CM | POA: Diagnosis not present

## 2021-05-19 DIAGNOSIS — R634 Abnormal weight loss: Secondary | ICD-10-CM | POA: Diagnosis not present

## 2021-05-23 ENCOUNTER — Other Ambulatory Visit: Payer: Self-pay | Admitting: Obstetrics and Gynecology

## 2021-05-23 DIAGNOSIS — Z1231 Encounter for screening mammogram for malignant neoplasm of breast: Secondary | ICD-10-CM

## 2021-05-24 DIAGNOSIS — R634 Abnormal weight loss: Secondary | ICD-10-CM | POA: Diagnosis not present

## 2021-05-24 DIAGNOSIS — R110 Nausea: Secondary | ICD-10-CM | POA: Diagnosis not present

## 2021-05-24 DIAGNOSIS — K224 Dyskinesia of esophagus: Secondary | ICD-10-CM | POA: Diagnosis not present

## 2021-05-24 DIAGNOSIS — K2970 Gastritis, unspecified, without bleeding: Secondary | ICD-10-CM | POA: Diagnosis not present

## 2021-06-01 DIAGNOSIS — M47812 Spondylosis without myelopathy or radiculopathy, cervical region: Secondary | ICD-10-CM | POA: Diagnosis not present

## 2021-06-07 DIAGNOSIS — L03114 Cellulitis of left upper limb: Secondary | ICD-10-CM | POA: Diagnosis not present

## 2021-06-08 ENCOUNTER — Telehealth (INDEPENDENT_AMBULATORY_CARE_PROVIDER_SITE_OTHER): Payer: Medicare HMO | Admitting: *Deleted

## 2021-06-08 DIAGNOSIS — R634 Abnormal weight loss: Secondary | ICD-10-CM | POA: Diagnosis not present

## 2021-06-08 DIAGNOSIS — D509 Iron deficiency anemia, unspecified: Secondary | ICD-10-CM

## 2021-06-08 LAB — IFOBT (OCCULT BLOOD): IFOBT: POSITIVE

## 2021-06-08 NOTE — Telephone Encounter (Signed)
Will discuss with patient when she has her office visit tomorrow

## 2021-06-08 NOTE — Telephone Encounter (Signed)
       Results for orders placed or performed in visit on 06/08/21  IFOBT POC (occult bld, rslt in office)  Result Value Ref Range   IFOBT Positive

## 2021-06-09 ENCOUNTER — Other Ambulatory Visit: Payer: Self-pay

## 2021-06-09 ENCOUNTER — Ambulatory Visit (INDEPENDENT_AMBULATORY_CARE_PROVIDER_SITE_OTHER): Payer: Medicare HMO | Admitting: Family Medicine

## 2021-06-09 ENCOUNTER — Ambulatory Visit
Admission: RE | Admit: 2021-06-09 | Discharge: 2021-06-09 | Disposition: A | Discharge status: Home / Self Care | Payer: Medicare HMO | Source: Ambulatory Visit | Attending: Obstetrics and Gynecology | Admitting: Obstetrics and Gynecology

## 2021-06-09 VITALS — BP 119/70 | HR 63 | Temp 97.2°F | Ht 64.0 in | Wt 104.0 lb

## 2021-06-09 DIAGNOSIS — Z1231 Encounter for screening mammogram for malignant neoplasm of breast: Secondary | ICD-10-CM | POA: Diagnosis not present

## 2021-06-09 DIAGNOSIS — R634 Abnormal weight loss: Secondary | ICD-10-CM

## 2021-06-09 DIAGNOSIS — R195 Other fecal abnormalities: Secondary | ICD-10-CM

## 2021-06-09 DIAGNOSIS — D509 Iron deficiency anemia, unspecified: Secondary | ICD-10-CM | POA: Diagnosis not present

## 2021-06-09 NOTE — Progress Notes (Signed)
   Subjective:    Patient ID: Shelly Stein, female    DOB: 07-21-1946, 75 y.o.   MRN: [id number]  HPI  Weight loss check her weight has gone up a couple pounds but what is concerning she had heme positive stool.  She is up-to-date on colonoscopy had a EGD that did not have specific findings but has not had a capsule test.  Bumps on left hand and r leg these are recently appearing under the care of dermatology they placed her on minocycline.  1 on the left hand and one behind the right knee no known bites  Review of Systems     Objective:   Physical Exam   General-in no acute distress Eyes-no discharge Lungs-respiratory rate normal, CTA CV-no murmurs,RRR Extremities skin warm dry no edema Neuro grossly normal Behavior normal, alert  Inflamed area on the finger noted.  Appears to be a bite area behind right leg noted as well    Assessment & Plan:  Bug bites versus local infection follow through with dermatology team next week send [place] an update  Weight loss stable currently  Heme positive stool neck step would be CT scan abdomen and pelvis with and without contrast patient needs further evaluation to make sure that there is not underlying tumor triggering into this area  Follow-up in 2 months  When the CAT scan is completed we will connect with gastroenterology if the CAT scan does not show the reason for her anemia neck step gastroenterology to consider capsule study  Iron tablet daily as directed

## 2021-06-14 ENCOUNTER — Encounter: Payer: Self-pay | Admitting: Family Medicine

## 2021-06-14 DIAGNOSIS — E790 Hyperuricemia without signs of inflammatory arthritis and tophaceous disease: Secondary | ICD-10-CM | POA: Diagnosis not present

## 2021-06-14 DIAGNOSIS — Z79899 Other long term (current) drug therapy: Secondary | ICD-10-CM | POA: Diagnosis not present

## 2021-06-14 DIAGNOSIS — M109 Gout, unspecified: Secondary | ICD-10-CM | POA: Diagnosis not present

## 2021-06-14 DIAGNOSIS — M810 Age-related osteoporosis without current pathological fracture: Secondary | ICD-10-CM | POA: Diagnosis not present

## 2021-06-14 DIAGNOSIS — M79643 Pain in unspecified hand: Secondary | ICD-10-CM | POA: Diagnosis not present

## 2021-06-14 DIAGNOSIS — K219 Gastro-esophageal reflux disease without esophagitis: Secondary | ICD-10-CM | POA: Diagnosis not present

## 2021-06-14 DIAGNOSIS — M1990 Unspecified osteoarthritis, unspecified site: Secondary | ICD-10-CM | POA: Diagnosis not present

## 2021-06-14 DIAGNOSIS — M542 Cervicalgia: Secondary | ICD-10-CM | POA: Diagnosis not present

## 2021-06-14 DIAGNOSIS — M0579 Rheumatoid arthritis with rheumatoid factor of multiple sites without organ or systems involvement: Secondary | ICD-10-CM | POA: Diagnosis not present

## 2021-06-14 NOTE — Telephone Encounter (Signed)
Nurses  Please connect with [facility]  It is possible that the medication could be causing her to have the loose stools as well as it can make the stool dark  As for the testing I recommend we move forward with a CAT scan this can look for any obvious growths.  The pill study is still something that can be beneficial but the CAT scan is the first step.  Please make sure that this CAT scan is being scheduled I would like for it to be completed this week or no later than next week.  Also it would be wise for her to go ahead and set up a follow-up visit with her gastroenterologist-May go ahead with that referral just in case his office requires a referral

## 2021-06-14 NOTE — Telephone Encounter (Signed)
Pt contacted and verbalized understanding. CT has been ordered but not scheduled. Message sent to [facility] to check on CT scan.

## 2021-06-15 DIAGNOSIS — L309 Dermatitis, unspecified: Secondary | ICD-10-CM | POA: Diagnosis not present

## 2021-06-15 DIAGNOSIS — D485 Neoplasm of uncertain behavior of skin: Secondary | ICD-10-CM | POA: Diagnosis not present

## 2021-06-15 DIAGNOSIS — W57XXXA Bitten or stung by nonvenomous insect and other nonvenomous arthropods, initial encounter: Secondary | ICD-10-CM | POA: Diagnosis not present

## 2021-06-15 DIAGNOSIS — L03114 Cellulitis of left upper limb: Secondary | ICD-10-CM | POA: Diagnosis not present

## 2021-06-16 DIAGNOSIS — M542 Cervicalgia: Secondary | ICD-10-CM | POA: Diagnosis not present

## 2021-06-30 ENCOUNTER — Other Ambulatory Visit: Payer: Self-pay

## 2021-06-30 ENCOUNTER — Ambulatory Visit (HOSPITAL_COMMUNITY)
Admission: RE | Admit: 2021-06-30 | Discharge: 2021-06-30 | Disposition: A | Discharge status: Home / Self Care | Payer: Medicare HMO | Source: Ambulatory Visit | Attending: Family Medicine | Admitting: Family Medicine

## 2021-06-30 DIAGNOSIS — R634 Abnormal weight loss: Secondary | ICD-10-CM | POA: Diagnosis not present

## 2021-06-30 DIAGNOSIS — D509 Iron deficiency anemia, unspecified: Secondary | ICD-10-CM | POA: Diagnosis not present

## 2021-06-30 DIAGNOSIS — R195 Other fecal abnormalities: Secondary | ICD-10-CM | POA: Insufficient documentation

## 2021-06-30 DIAGNOSIS — K7689 Other specified diseases of liver: Secondary | ICD-10-CM | POA: Diagnosis not present

## 2021-06-30 DIAGNOSIS — M419 Scoliosis, unspecified: Secondary | ICD-10-CM | POA: Diagnosis not present

## 2021-06-30 LAB — POCT I-STAT CREATININE: Creatinine, Ser: 0.8 mg/dL (ref 0.44–1.00)

## 2021-06-30 MED ORDER — IOHEXOL 300 MG/ML  SOLN
100.0000 mL | Freq: Once | INTRAMUSCULAR | Status: AC | PRN
  Administered 2021-06-30: 75 mL via INTRAVENOUS

## 2021-07-26 DIAGNOSIS — R198 Other specified symptoms and signs involving the digestive system and abdomen: Secondary | ICD-10-CM | POA: Diagnosis not present

## 2021-07-26 DIAGNOSIS — R634 Abnormal weight loss: Secondary | ICD-10-CM | POA: Diagnosis not present

## 2021-07-26 DIAGNOSIS — R110 Nausea: Secondary | ICD-10-CM | POA: Diagnosis not present

## 2021-07-26 DIAGNOSIS — R5383 Other fatigue: Secondary | ICD-10-CM | POA: Diagnosis not present

## 2021-07-26 DIAGNOSIS — K224 Dyskinesia of esophagus: Secondary | ICD-10-CM | POA: Diagnosis not present

## 2021-07-27 DIAGNOSIS — Z01419 Encounter for gynecological examination (general) (routine) without abnormal findings: Secondary | ICD-10-CM | POA: Diagnosis not present

## 2021-07-27 DIAGNOSIS — Z01411 Encounter for gynecological examination (general) (routine) with abnormal findings: Secondary | ICD-10-CM | POA: Diagnosis not present

## 2021-07-27 DIAGNOSIS — Z79899 Other long term (current) drug therapy: Secondary | ICD-10-CM | POA: Diagnosis not present

## 2021-07-27 DIAGNOSIS — R634 Abnormal weight loss: Secondary | ICD-10-CM | POA: Diagnosis not present

## 2021-08-09 ENCOUNTER — Other Ambulatory Visit: Payer: Self-pay

## 2021-08-09 ENCOUNTER — Ambulatory Visit (INDEPENDENT_AMBULATORY_CARE_PROVIDER_SITE_OTHER): Payer: Medicare HMO | Admitting: Family Medicine

## 2021-08-09 ENCOUNTER — Encounter: Payer: Self-pay | Admitting: Family Medicine

## 2021-08-09 VITALS — BP 104/63 | Temp 97.4°F | Wt 105.6 lb

## 2021-08-09 DIAGNOSIS — D509 Iron deficiency anemia, unspecified: Secondary | ICD-10-CM | POA: Diagnosis not present

## 2021-08-09 DIAGNOSIS — R195 Other fecal abnormalities: Secondary | ICD-10-CM | POA: Diagnosis not present

## 2021-08-09 DIAGNOSIS — R634 Abnormal weight loss: Secondary | ICD-10-CM | POA: Diagnosis not present

## 2021-08-09 NOTE — Progress Notes (Signed)
   Subjective:    Patient ID: Shelly Stein, female    DOB: [birth year], 75 y.o.   MRN: [id number]  HPI Pt here for follow up on weight loss. Pt seen GI on 07/27/21 and they did lab work. Not sure why she is still not gaining weight. GI wants her to see a GI at [facility].  Patient understandably is frustrated She saw her GI specialist in [place] they evaluated her and set her up in [place] she states an appointment will more likely be toward the end of the year She is concerned about her weight loss although her weight has been stable recently She has been taking iron supplement She states her bowel movements tend to be thin and dark She denies vomiting States her appetite is good Denies abdominal pain denies fevers chills Did have a CT scan please see this   Review of Systems     Objective:   Physical Exam  Her lungs are clear heart regular pulse normal abdomen soft no guarding or rebound patient does not appear toxic      Assessment & Plan:   1. Weight loss Weight loss unfortunate but seems to have stabilized.  Continue to try to take in adequate calories.  We will check tissue transglutaminase this was checked a few years ago.  Should be noted that the patient had a normal gallbladder ejection fraction a couple years ago also had normal gastric emptying study Colonoscopy did not show any tumor Recent CAT scan did not show any growths  - Tissue transglutaminase, IgA - Ferritin - Iron Binding Cap (TIBC)([facility])  2. Iron deficiency anemia, unspecified iron deficiency anemia type GI in [name] unable to figure out why heme positive stool referring her to [facility] for further gastroenterology  If still having significant iron saturation issues consider hematology referral for iron infusions while she undergoes further GI work-up - Tissue transglutaminase, IgA - Ferritin - Iron Binding Cap (TIBC)([facility])  3. Heme positive  stool See discussion above - Tissue transglutaminase, IgA - Ferritin - Iron Binding Cap (TIBC)([facility])

## 2021-08-12 LAB — FERRITIN: Ferritin: 125 ng/mL (ref 15–150)

## 2021-08-12 LAB — IRON AND TIBC
Iron Saturation: 26 % (ref 15–55)
Iron: 75 ug/dL (ref 27–139)
Total Iron Binding Capacity: 292 ug/dL (ref 250–450)
UIBC: 217 ug/dL (ref 118–369)

## 2021-08-12 LAB — TISSUE TRANSGLUTAMINASE, IGA: Transglutaminase IgA: <2 U/mL (ref 0–3)

## 2021-08-22 ENCOUNTER — Other Ambulatory Visit: Payer: Self-pay | Admitting: Family Medicine

## 2021-09-02 ENCOUNTER — Telehealth: Payer: Self-pay | Admitting: Nurse Practitioner

## 2021-09-02 ENCOUNTER — Other Ambulatory Visit: Payer: Self-pay | Admitting: Nurse Practitioner

## 2021-09-02 MED ORDER — NIRMATRELVIR/RITONAVIR (PAXLOVID)TABLET: 3.0000 | ORAL_TABLET | Freq: Two times a day (BID) | ORAL | 0 refills | Status: AC

## 2021-09-02 NOTE — Telephone Encounter (Signed)
Pt tested positive for COVID today. Pt had sore throat only since Tuesday morning. Pt decided to get tested. Tested positive at [facility]. Pt not having any trouble breathing or shortness of breath. Able to keep fluids down. No fever. Reviewed over [facility] warnings and pt verbalized understanding. Med will need to be called into [facility].  Please advise. Thank you

## 2021-09-02 NOTE — Progress Notes (Signed)
See nurse's note/phone call from today.

## 2021-09-05 ENCOUNTER — Telehealth: Payer: Self-pay | Admitting: Family Medicine

## 2021-09-05 NOTE — Telephone Encounter (Signed)
FYI-Patient was told to call back from Friday 9/23 if any side effects from Covid medication that was prescribe. She state had some diarrhea and nausea but is feeling better now and half way through medication.

## 2021-09-05 NOTE — Telephone Encounter (Signed)
FYI

## 2021-09-05 NOTE — Telephone Encounter (Signed)
So these are common side effects hopefully will gradually get better if having ongoing troubles or worse notify us

## 2021-09-06 NOTE — Telephone Encounter (Signed)
Pt contacted and verbalized understanding. Pt states she is feeling better and has one dose this afternoon and the doses for tomorrow.

## 2021-09-16 DIAGNOSIS — M1990 Unspecified osteoarthritis, unspecified site: Secondary | ICD-10-CM | POA: Diagnosis not present

## 2021-09-16 DIAGNOSIS — E790 Hyperuricemia without signs of inflammatory arthritis and tophaceous disease: Secondary | ICD-10-CM | POA: Diagnosis not present

## 2021-09-16 DIAGNOSIS — M0579 Rheumatoid arthritis with rheumatoid factor of multiple sites without organ or systems involvement: Secondary | ICD-10-CM | POA: Diagnosis not present

## 2021-09-16 DIAGNOSIS — Z79899 Other long term (current) drug therapy: Secondary | ICD-10-CM | POA: Diagnosis not present

## 2021-09-16 DIAGNOSIS — M109 Gout, unspecified: Secondary | ICD-10-CM | POA: Diagnosis not present

## 2021-09-16 DIAGNOSIS — M810 Age-related osteoporosis without current pathological fracture: Secondary | ICD-10-CM | POA: Diagnosis not present

## 2021-09-16 DIAGNOSIS — M542 Cervicalgia: Secondary | ICD-10-CM | POA: Diagnosis not present

## 2021-09-16 DIAGNOSIS — K219 Gastro-esophageal reflux disease without esophagitis: Secondary | ICD-10-CM | POA: Diagnosis not present

## 2021-09-16 DIAGNOSIS — M79643 Pain in unspecified hand: Secondary | ICD-10-CM | POA: Diagnosis not present

## 2021-09-20 ENCOUNTER — Encounter: Payer: Self-pay | Admitting: Family Medicine

## 2021-09-20 ENCOUNTER — Ambulatory Visit (INDEPENDENT_AMBULATORY_CARE_PROVIDER_SITE_OTHER): Payer: Medicare HMO

## 2021-09-20 ENCOUNTER — Other Ambulatory Visit: Payer: Self-pay

## 2021-09-20 VITALS — BP 102/64 | HR 71 | Temp 97.9°F | Ht 63.0 in | Wt 105.4 lb

## 2021-09-20 DIAGNOSIS — Z0000 Encounter for general adult medical examination without abnormal findings: Secondary | ICD-10-CM

## 2021-09-20 DIAGNOSIS — D369 Benign neoplasm, unspecified site: Secondary | ICD-10-CM

## 2021-09-20 HISTORY — DX: Benign neoplasm, unspecified site: D36.9

## 2021-09-20 NOTE — Progress Notes (Signed)
Subjective:   Shelly Stein is a 75 y.o. female who presents for an Initial Medicare Annual Wellness Visit.  Review of Systems     Cardiac Risk Factors include: advanced age (>50men, >62 women);sedentary lifestyle;hypertension     Objective:    Today's Vitals   09/20/21 1341 09/20/21 1354  BP: 102/64   Pulse: 71   Temp: 97.9 F (36.6 C)   SpO2: 98%   Height: 5\' 3"  (1.6 m)   PainSc:  5    Body mass index is 18.71 kg/m.  Advanced Directives 09/20/2021 08/06/2018 07/25/2018 07/02/2018 06/25/2018 06/20/2018 06/14/2018  Does Patient Have a Medical Advance Directive? Yes Yes Yes Yes - Yes Yes  Type of Advance Directive [place];Living will [place];Living will [place];Living will [place];Living will [place];Living will [place];Living will [place];Living will  Does patient want to make changes to medical advance directive? - - - - - - -  Copy of [place] in Chart? No - copy requested - - - - - -    Current Medications (verified) Outpatient Encounter Medications as of 09/20/2021  Medication Sig   allopurinol (ZYLOPRIM) 100 MG tablet 2 (two) times daily.    Ascorbic Acid (VITAMIN C) 1000 MG tablet Take 1,000 mg by mouth daily.   Cholecalciferol (VITAMIN D3) 50 MCG (2000 UT) capsule    ferrous sulfate 325 (65 FE) MG tablet Take 325 mg by mouth daily with breakfast.   Fish Oil OIL Take 1,000 mg by mouth.   folic acid (FOLVITE) 1 MG tablet    FORTEO 600 MCG/2.4ML SOPN    Homeopathic Products (ARNICARE) GEL See admin instructions.   lisinopril (ZESTRIL) 5 MG tablet TAKE 1/2 TABLET (2.5 MG) BY MOUTH DAILY   Methotrexate 2.5 MG/ML SOLN    MULTIPLE VITAMIN-FOLIC ACID PO 1 tablet   OVER THE COUNTER MEDICATION Calcium and vit d3   pantoprazole (PROTONIX) 40 MG tablet Take 1 tablet by mouth daily.   rosuvastatin (CRESTOR) 5  MG tablet TAKE 1 TABLET EVERY DAY   SHINGRIX injection INJECT 0.5 ML INTO MUSCLE ONCE FOR 1 DOSE   [DISCONTINUED] methotrexate 2.5 MG tablet Take 20 mg by mouth once a week. (Eight tablets once a week)   [DISCONTINUED] Multiple Vitamins-Minerals (MULTIVITAMIN WITH MINERALS) tablet Take 1 tablet by mouth daily.   No facility-administered encounter medications on file as of 09/20/2021.    Allergies (verified) Patient has no known allergies.   History: Past Medical History:  Diagnosis Date   Abnormal MRI    Back pain    Benign cyst of breast, left    Diverticulosis    mild   Gastritis    Headache    Hypertension    Microscopic hematuria 07/28/2020   Worked up by [facility] urology.  Cystoscope negative.  August 2021.  CAT scan 2018 negative   Nausea    Nutcracker esophagus    Osteoporosis    Sciatica of left side    Tinnitus    Unsteady gait    Past Surgical History:  Procedure Laterality Date   ABDOMINAL HYSTERECTOMY     BREAST BIOPSY Left 06/02/2016   ESOPHAGEAL MANOMETRY     FLEXIBLE SIGMOIDOSCOPY     FOOT SURGERY     SHOULDER SURGERY     Family History  Problem Relation Age of Onset   Breast cancer Mother    Heart disease Mother  Aneurysm Father    Colon cancer Neg Hx    Stomach cancer Neg Hx    Rectal cancer Neg Hx    Esophageal cancer Neg Hx    Liver cancer Neg Hx    Social History   Socioeconomic History   Marital status: Widowed    Spouse name: Not on file   Number of children: 1   Years of education: some college   Highest education level: Not on file  Occupational History   Occupation: part time  [place] Use   Smoking status: Never   Smokeless tobacco: Never  Vaping Use   Vaping Use: Never used  Substance and Sexual Activity   Alcohol use: No   Drug use: No   Sexual activity: Not on file  Other Topics Concern   Not on file  Social History Narrative   Lives at home alone. Widowed at age 31.    Right-handed.    Occasional caffeine.   Social Determinants of Health   Financial Resource Strain: Low Risk    Difficulty of Paying Living Expenses: Not hard at all  Food Insecurity: No Food Insecurity   Worried About Charity fundraiser in the Last Year: Never true   [place] in the Last Year: Never true  Transportation Needs: No Transportation Needs   Lack of Transportation (Medical): No   Lack of Transportation (Non-Medical): No  Physical Activity: Sufficiently Active   Days of Exercise per Week: 5 days   Minutes of Exercise per Session: 30 min  Stress: No Stress Concern Present   Feeling of Stress : Only a little  Social Connections: Moderately Integrated   Frequency of Communication with Friends and Family: More than three times a week   Frequency of Social Gatherings with Friends and Family: More than three times a week   Attends Religious Services: More than 4 times per year   Active Member of [employer] or Organizations: Yes   Attends Archivist Meetings: More than 4 times per year   Marital Status: Widowed    Tobacco Counseling Counseling given: Not Answered   Clinical Intake:  Pre-visit preparation completed: Yes  Pain : 0-10 Pain Score: 5  Pain Type: Chronic pain Pain Location: Back Pain Descriptors / Indicators: Aching Pain Onset: More than a month ago Pain Frequency: Intermittent     Nutritional Risks: None Diabetes: No  How often do you need to have someone help you when you read instructions, pamphlets, or other written materials from your doctor or pharmacy?: 1 - Never  Diabetic?no  Interpreter Needed?: No  Information entered by :: MJ Teliyah Royal, LPN   Activities of Daily Living In your present state of health, do you have any difficulty performing the following activities: 09/20/2021  Hearing? N  Vision? N  Difficulty concentrating or making decisions? Y  Walking or climbing stairs? N  Dressing or bathing? N  Doing errands, shopping? N   Preparing Food and eating ? N  Using the Toilet? N  In the past six months, have you accidently leaked urine? N  Do you have problems with loss of bowel control? N  Managing your Medications? N  Managing your Finances? N  Housekeeping or managing your Housekeeping? N  Some recent data might be hidden    Patient Care Team: Kathyrn Drown, MD as PCP - General (Family Medicine) Otis Brace, MD as Consulting Physician (Gastroenterology)  Indicate any recent Medical Services you may have received  from other than [facility] providers in the past year (date may be approximate).     Assessment:   This is a routine wellness examination for [place].  Hearing/Vision screen Hearing Screening - Comments:: No hearing issues.  Vision Screening - Comments:: Glasses. Dr. Marco Collie In [place]. 11/2021.  Dietary issues and exercise activities discussed: Current Exercise Habits: Home exercise routine, Type of exercise: walking, Time (Minutes): 30, Frequency (Times/Week): 5, Weekly Exercise (Minutes/Week): 150, Intensity: Mild, Exercise limited by: orthopedic condition(s);cardiac condition(s)   Goals Addressed             This Visit's Progress    Have 3 meals a day       Increase calorie intake to gain weight.       Depression Screen PHQ 2/9 Scores 09/20/2021 08/09/2021 06/09/2021 05/10/2021 03/15/2021 11/09/2020 10/29/2017  PHQ - 2 Score 0 0 0 0 0 0 0    Fall Risk Fall Risk  09/20/2021 08/09/2021 06/09/2021 05/10/2021 11/09/2020  Falls in the past year? 0 0 0 0 0  Comment - - - - -  Number falls in past yr: 0 0 0 - -  Comment - - - - -  Injury with Fall? 0 0 0 - -  Risk for fall due to : Impaired vision No Fall Risks - No Fall Risks -  Follow up Falls prevention discussed Falls evaluation completed Falls evaluation completed Falls evaluation completed Falls evaluation completed    [place]:  Any stairs in or around the home? Yes  If so, are there  any without handrails? No  Home free of loose throw rugs in walkways, pet beds, electrical cords, etc? Yes  Adequate lighting in your home to reduce risk of falls? Yes   ASSISTIVE DEVICES UTILIZED TO PREVENT FALLS:  Life alert? No  Use of a cane, walker or w/c? No  Grab bars in the bathroom? Yes  Shower chair or bench in shower? Yes  Elevated toilet seat or a handicapped toilet? Yes   TIMED UP AND GO:  Was the test performed? Yes .  Length of time to ambulate 10 feet: 7 sec.   Gait steady and fast without use of assistive device  Cognitive Function:     6CIT Screen 09/20/2021  What Year? 0 points  What month? 0 points  What time? 0 points  Count back from 20 0 points  Months in reverse 0 points  Repeat phrase 0 points  Total Score 0    Immunizations Immunization History  Administered Date(s) Administered   Moderna Sars-Covid-2 Vaccination 09/15/2020, 09/28/2020, 10/13/2020, 01/11/2021   Pneumococcal Conjugate-13 10/23/2014   Pneumococcal Polysaccharide-23 08/12/2011   Zoster Recombinat (Shingrix) 08/14/2019    TDAP status: Due, Education has been provided regarding the importance of this vaccine. Advised may receive this vaccine at local pharmacy or Health Dept. Aware to provide a copy of the vaccination record if obtained from local pharmacy or Health Dept. Verbalized acceptance and understanding.  Flu Vaccine status: Declined, Education has been provided regarding the importance of this vaccine but patient still declined. Advised may receive this vaccine at local pharmacy or Health Dept. Aware to provide a copy of the vaccination record if obtained from local pharmacy or Health Dept. Verbalized acceptance and understanding.  Pneumococcal vaccine status: Up to date  Covid-19 vaccine status: Completed vaccines  Qualifies for Shingles Vaccine? Yes   Zostavax completed Yes   Shingrix Completed?: No.    Education has been provided regarding the  importance of this  vaccine. Patient has been advised to call insurance company to determine out of pocket expense if they have not yet received this vaccine. Advised may also receive vaccine at local pharmacy or Health Dept. Verbalized acceptance and understanding.  Screening Tests Health Maintenance  Topic Date Due   Hepatitis C Screening  Never done   TETANUS/TDAP  Never done   Zoster Vaccines- Shingrix (2 of 2) 10/09/2019   COVID-19 Vaccine (4 - Booster) 04/05/2021   COLONOSCOPY (Pts 45-31yrs Insurance coverage will need to be confirmed)  02/11/2024   DEXA SCAN  Completed   HPV VACCINES  Aged Out   INFLUENZA VACCINE  Discontinued    Health Maintenance  Health Maintenance Due  Topic Date Due   Hepatitis C Screening  Never done   TETANUS/TDAP  Never done   Zoster Vaccines- Shingrix (2 of 2) 10/09/2019   COVID-19 Vaccine (4 - Booster) 04/05/2021    Colorectal cancer screening: Type of screening: Colonoscopy. Completed 02/09/2021. Repeat every 3 years  Mammogram status: Completed 06/09/2021. Repeat every year  Bone Density status: Completed 06/14/2018. Results reflect: Bone density results: OSTEOPOROSIS. Repeat every 2 years.  Lung Cancer Screening: (Low Dose CT Chest recommended if Age 40-80 years, 30 pack-year currently smoking OR have quit w/in 15years.) does not qualify.     Additional Screening:  Hepatitis C Screening: does qualify; Completed Not done.  Vision Screening: Recommended annual ophthalmology exams for early detection of glaucoma and other disorders of the eye. Is the patient up to date with their annual eye exam?  Yes  Who is the provider or what is the name of the office in which the patient attends annual eye exams? [facility] If pt is not established with a provider, would they like to be referred to a provider to establish care? No .   Dental Screening: Recommended annual dental exams for proper oral hygiene  Community Resource Referral / Chronic Care Management: CRR  required this visit?  No   CCM required this visit?  No      Plan:     I have personally reviewed and noted the following in the patient's chart:   Medical and social history Use of alcohol, tobacco or illicit drugs  Current medications and supplements including opioid prescriptions. Patient is not currently taking opioid prescriptions. Functional ability and status Nutritional status Physical activity Advanced directives List of other physicians Hospitalizations, surgeries, and ER visits in previous 12 months Vitals Screenings to include cognitive, depression, and falls Referrals and appointments  In addition, I have reviewed and discussed with patient certain preventive protocols, quality metrics, and best practice recommendations. A written personalized care plan for preventive services as well as general preventive health recommendations were provided to patient.     Chriss Driver, LPN   [id number]   Nurse Notes: Pt states she is doing well other than chronic back pain. Rates at 5 today. Up to date on Health Maintenance. Pt states her rheumatologist requests copy of Bone density. Pt obtained copy at visit today. Declined flu vaccine today.

## 2021-09-20 NOTE — Patient Instructions (Signed)
Shelly Stein , Thank you for taking time to come for your Medicare Wellness Visit. I appreciate your ongoing commitment to your health goals. Please review the following plan we discussed and let me know if I can assist you in the future.   Screening recommendations/referrals: Colonoscopy: Done 02/09/2021 Repeat in 3 years  Mammogram: Done 06/09/2021 Repeat annually  Bone Density: Done 06/14/2018 Repeat every 2 years  Recommended yearly ophthalmology/optometry visit for glaucoma screening and checkup Recommended yearly dental visit for hygiene and checkup  Vaccinations: Influenza vaccine: Due. Repeat annually  Pneumococcal vaccine: Done 08/12/2011 and 10/23/2014 Tdap vaccine: Repeat in 10 years  Shingles vaccine: Shingrix discussed. Please contact your pharmacy for coverage information.     Covid-19:Done 09/15/20, 09/25/20, 10/23/20, 01/11/21  Advanced directives: Please bring a copy of your health care power of attorney and living will to the office to be added to your chart at your convenience.   Conditions/risks identified: Aim for 30 minutes of exercise or walking each day, drink 6-8 glasses of water and eat lots of fruits and vegetables.   Next appointment: Follow up in one year for your annual wellness visit 2023.   Preventive Care 75 Years and Older, Female Preventive care refers to lifestyle choices and visits with your health care provider that can promote health and wellness. What does preventive care include? A yearly physical exam. This is also called an annual well check. Dental exams once or twice a year. Routine eye exams. Ask your health care provider how often you should have your eyes checked. Personal lifestyle choices, including: Daily care of your teeth and gums. Regular physical activity. Eating a healthy diet. Avoiding tobacco and drug use. Limiting alcohol use. Practicing safe sex. Taking low-dose aspirin every day. Taking vitamin and mineral supplements as  recommended by your health care provider. What happens during an annual well check? The services and screenings done by your health care provider during your annual well check will depend on your age, overall health, lifestyle risk factors, and family history of disease. Counseling  Your health care provider may ask you questions about your: Alcohol use. Tobacco use. Drug use. Emotional well-being. Home and relationship well-being. Sexual activity. Eating habits. History of falls. Memory and ability to understand (cognition). Work and work Statistician. Reproductive health. Screening  You may have the following tests or measurements: Height, weight, and BMI. Blood pressure. Lipid and cholesterol levels. These may be checked every 5 years, or more frequently if you are over 47 years old. Skin check. Lung cancer screening. You may have this screening every year starting at age 24 if you have a 30-pack-year history of smoking and currently smoke or have quit within the past 15 years. Fecal occult blood test (FOBT) of the stool. You may have this test every year starting at age 20. Flexible sigmoidoscopy or colonoscopy. You may have a sigmoidoscopy every 5 years or a colonoscopy every 10 years starting at age 82. Hepatitis C blood test. Hepatitis B blood test. Sexually transmitted disease (STD) testing. Diabetes screening. This is done by checking your blood sugar (glucose) after you have not eaten for a while (fasting). You may have this done every 1-3 years. Bone density scan. This is done to screen for osteoporosis. You may have this done starting at age 39. Mammogram. This may be done every 1-2 years. Talk to your health care provider about how often you should have regular mammograms. Talk with your health care provider about your test results, treatment options,  and if necessary, the need for more tests. Vaccines  Your health care provider may recommend certain vaccines, such  as: Influenza vaccine. This is recommended every year. Tetanus, diphtheria, and acellular pertussis (Tdap, Td) vaccine. You may need a Td booster every 10 years. Zoster vaccine. You may need this after age 75. Pneumococcal 13-valent conjugate (PCV13) vaccine. One dose is recommended after age 98. Pneumococcal polysaccharide (PPSV23) vaccine. One dose is recommended after age 75. Talk to your health care provider about which screenings and vaccines you need and how often you need them. This information is not intended to replace advice given to you by your health care provider. Make sure you discuss any questions you have with your health care provider. Document Released: 12/24/2015 Document Revised: 08/16/2016 Document Reviewed: 09/28/2015 Elsevier Interactive Patient Education  2017 [place] Prevention in the Home Falls can cause injuries. They can happen to people of all ages. There are many things you can do to make your home safe and to help prevent falls. What can I do on the outside of my home? Regularly fix the edges of walkways and driveways and fix any cracks. Remove anything that might make you trip as you walk through a door, such as a raised step or threshold. Trim any bushes or trees on the path to your home. Use bright outdoor lighting. Clear any walking paths of anything that might make someone trip, such as rocks or tools. Regularly check to see if handrails are loose or broken. Make sure that both sides of any steps have handrails. Any raised decks and porches should have guardrails on the edges. Have any leaves, snow, or ice cleared regularly. Use sand or salt on walking paths during winter. Clean up any spills in your garage right away. This includes oil or grease spills. What can I do in the bathroom? Use night lights. Install grab bars by the toilet and in the tub and shower. Do not use towel bars as grab bars. Use non-skid mats or decals in the tub or  shower. If you need to sit down in the shower, use a plastic, non-slip stool. Keep the floor dry. Clean up any water that spills on the floor as soon as it happens. Remove soap buildup in the tub or shower regularly. Attach bath mats securely with double-sided non-slip rug tape. Do not have throw rugs and other things on the floor that can make you trip. What can I do in the bedroom? Use night lights. Make sure that you have a light by your bed that is easy to reach. Do not use any sheets or blankets that are too big for your bed. They should not hang down onto the floor. Have a firm chair that has side arms. You can use this for support while you get dressed. Do not have throw rugs and other things on the floor that can make you trip. What can I do in the kitchen? Clean up any spills right away. Avoid walking on wet floors. Keep items that you use a lot in easy-to-reach places. If you need to reach something above you, use a strong step stool that has a grab bar. Keep electrical cords out of the way. Do not use floor polish or wax that makes floors slippery. If you must use wax, use non-skid floor wax. Do not have throw rugs and other things on the floor that can make you trip. What can I do with my stairs? Do not leave  any items on the stairs. Make sure that there are handrails on both sides of the stairs and use them. Fix handrails that are broken or loose. Make sure that handrails are as long as the stairways. Check any carpeting to make sure that it is firmly attached to the stairs. Fix any carpet that is loose or worn. Avoid having throw rugs at the top or bottom of the stairs. If you do have throw rugs, attach them to the floor with carpet tape. Make sure that you have a light switch at the top of the stairs and the bottom of the stairs. If you do not have them, ask someone to add them for you. What else can I do to help prevent falls? Wear shoes that: Do not have high heels. Have  rubber bottoms. Are comfortable and fit you well. Are closed at the toe. Do not wear sandals. If you use a stepladder: Make sure that it is fully opened. Do not climb a closed stepladder. Make sure that both sides of the stepladder are locked into place. Ask someone to hold it for you, if possible. Clearly mark and make sure that you can see: Any grab bars or handrails. First and last steps. Where the edge of each step is. Use tools that help you move around (mobility aids) if they are needed. These include: Canes. Walkers. Scooters. Crutches. Turn on the lights when you go into a dark area. Replace any light bulbs as soon as they burn out. Set up your furniture so you have a clear path. Avoid moving your furniture around. If any of your floors are uneven, fix them. If there are any pets around you, be aware of where they are. Review your medicines with your doctor. Some medicines can make you feel dizzy. This can increase your chance of falling. Ask your doctor what other things that you can do to help prevent falls. This information is not intended to replace advice given to you by your health care provider. Make sure you discuss any questions you have with your health care provider. Document Released: 09/23/2009 Document Revised: 05/04/2016 Document Reviewed: 01/01/2015 Elsevier Interactive Patient Education  2017 [employer].

## 2021-10-10 DIAGNOSIS — D1801 Hemangioma of skin and subcutaneous tissue: Secondary | ICD-10-CM | POA: Diagnosis not present

## 2021-10-10 DIAGNOSIS — L309 Dermatitis, unspecified: Secondary | ICD-10-CM | POA: Diagnosis not present

## 2021-10-10 DIAGNOSIS — L905 Scar conditions and fibrosis of skin: Secondary | ICD-10-CM | POA: Diagnosis not present

## 2021-10-10 DIAGNOSIS — L812 Freckles: Secondary | ICD-10-CM | POA: Diagnosis not present

## 2021-10-10 DIAGNOSIS — S80869D Insect bite (nonvenomous), unspecified lower leg, subsequent encounter: Secondary | ICD-10-CM | POA: Diagnosis not present

## 2021-11-01 ENCOUNTER — Telehealth: Payer: Self-pay | Admitting: Family Medicine

## 2021-11-01 NOTE — Telephone Encounter (Signed)
FYI  Patient normally has 3 to 4 month follow up visits and her December appointment was cancelled per provider. Stated that Dr. Nicki Reaper wanted to see her right after seeing a specialist and is not able to see specialist until 2/8. She has appointment 2/9 but if Dr. Nicki Reaper needs to see her sooner, please call.    CB#:  [phone]

## 2021-11-01 NOTE — Telephone Encounter (Signed)
So I am fine with the appointment being in February as long as the patient monitors how well she is doing with her eating and activity.  If she feels that she is going backwards rather than progressing forwards please let us know and we will be happy to work her in sooner

## 2021-11-10 ENCOUNTER — Telehealth: Payer: Self-pay | Admitting: *Deleted

## 2021-11-10 NOTE — Chronic Care Management (AMB) (Signed)
  Chronic Care Management   Note  11/10/2021 Name: Shelly Stein MRN: [id number] DOB: 25-Nov-1946  Shelly Stein is a 75 y.o. year old female who is a primary care patient of Luking, Elayne Snare, MD. I reached out to Vincente Liberty by phone today in response to a referral sent by Ms. Shelly Critchley Levengood's PCP.  [name] was given information about Chronic Care Management services today including:  CCM service includes personalized support from designated clinical staff supervised by her physician, including individualized plan of care and coordination with other care providers 24/7 contact phone numbers for assistance for urgent and routine care needs. Service will only be billed when office clinical staff spend 20 minutes or more in a month to coordinate care. Only one practitioner may furnish and bill the service in a calendar month. The patient may stop CCM services at any time (effective at the end of the month) by phone call to the office staff. The patient is responsible for co-pay (up to 20% after annual deductible is met) if co-pay is required by the individual health plan.   Patient agreed to services and verbal consent obtained.   Follow up plan: Telephone appointment with care management team member scheduled for:11/30/21  [facility]  Direct Dial: [phone]

## 2021-11-29 ENCOUNTER — Other Ambulatory Visit: Payer: Self-pay | Admitting: Family Medicine

## 2021-11-30 ENCOUNTER — Ambulatory Visit (INDEPENDENT_AMBULATORY_CARE_PROVIDER_SITE_OTHER): Payer: Medicare HMO | Admitting: *Deleted

## 2021-11-30 DIAGNOSIS — E7849 Other hyperlipidemia: Secondary | ICD-10-CM

## 2021-11-30 DIAGNOSIS — I10 Essential (primary) hypertension: Secondary | ICD-10-CM

## 2021-11-30 NOTE — Chronic Care Management (AMB) (Signed)
Chronic Care Management   CCM RN Visit Note  11/30/2021 Name: Shelly Stein MRN: [id number] DOB: 05-29-1946  Subjective: Shelly Stein is a 75 y.o. year old female who is a primary care patient of Luking, Elayne Snare, MD. The care management team was consulted for assistance with disease management and care coordination needs.    Engaged with patient by telephone for initial visit in response to provider referral for case management and/or care coordination services.   Consent to Services:  The patient was given the following information about Chronic Care Management services today, agreed to services, and gave verbal consent: 1. CCM service includes personalized support from designated clinical staff supervised by the primary care provider, including individualized plan of care and coordination with other care providers 2. 24/7 contact phone numbers for assistance for urgent and routine care needs. 3. Service will only be billed when office clinical staff spend 20 minutes or more in a month to coordinate care. 4. Only one practitioner may furnish and bill the service in a calendar month. 5.The patient may stop CCM services at any time (effective at the end of the month) by phone call to the office staff. 6. The patient will be responsible for cost sharing (co-pay) of up to 20% of the service fee (after annual deductible is met). Patient agreed to services and consent obtained.  Patient agreed to services and verbal consent obtained.   Assessment: Review of patient past medical history, allergies, medications, health status, including review of consultants reports, laboratory and other test data, was performed as part of comprehensive evaluation and provision of chronic care management services.   SDOH (Social Determinants of Health) assessments and interventions performed:  SDOH Interventions    Flowsheet Row Most Recent Value  SDOH Interventions   Food Insecurity Interventions  Intervention Not Indicated  Transportation Interventions Intervention Not Indicated        CCM Care Plan  No Known Allergies  Outpatient Encounter Medications as of 11/30/2021  Medication Sig   allopurinol (ZYLOPRIM) 100 MG tablet 2 (two) times daily.    Ascorbic Acid (VITAMIN C) 1000 MG tablet Take 1,000 mg by mouth daily.   Cholecalciferol (VITAMIN D3) 50 MCG (2000 UT) capsule    ferrous sulfate 325 (65 FE) MG tablet Take 325 mg by mouth daily with breakfast.   Fish Oil OIL Take 1,000 mg by mouth.   folic acid (FOLVITE) 1 MG tablet    Homeopathic Products ([facility]) GEL See admin instructions.   lisinopril (ZESTRIL) 5 MG tablet TAKE 1/2 TABLET(2.5 MG) BY MOUTH DAILY   Methotrexate 2.5 MG/ML SOLN    MULTIPLE VITAMIN-FOLIC ACID PO 1 tablet   OVER THE COUNTER MEDICATION Calcium and vit d3   rosuvastatin (CRESTOR) 5 MG tablet TAKE 1 TABLET EVERY DAY   FORTEO 600 MCG/2.4ML SOPN  (Patient not taking: Reported on 11/30/2021)   pantoprazole (PROTONIX) 40 MG tablet Take 1 tablet by mouth daily. (Patient not taking: Reported on 11/30/2021)   SHINGRIX injection INJECT 0.5 ML INTO MUSCLE ONCE FOR 1 DOSE (Patient not taking: Reported on 11/30/2021)   No facility-administered encounter medications on file as of 11/30/2021.    Patient Active Problem List   Diagnosis Date Noted   Tubular adenoma 09/20/2021   Rheumatoid arthritis, involving unspecified site, unspecified whether rheumatoid factor present ([place]) 03/15/2021   Other hyperlipidemia 09/13/2020   Microscopic hematuria 07/28/2020   Pain in left shoulder 10/29/2019   Nauseous 07/10/2019   Essential hypertension 07/23/2018   Abdominal  pain, left lower quadrant 03/07/2017   Nausea and vomiting in adult 03/07/2017   Rectal bleeding 03/07/2017   Sciatica of left side 12/12/2013   Osteoporosis 06/28/2011    Conditions to be addressed/monitored:HTN and HLD  Care Plan : [place] of Care  Updates made by [name],  RN since 11/30/2021 12:00 AM     Problem: No plan of care established for management of chronic disease states  (HTN, HLD, chronic pain, chronic nausea)   Priority: High     Long-Range Goal: Development of plan of care for chronic disease management  (HTN, HLD, chronic pain, chronic nausea)   Start Date: 11/30/2021  Expected End Date: 05/29/2022  Priority: High  Note:   Current Barriers:  Knowledge Deficits related to plan of care for management of HTN and HLD  Patient reports she lives alone, has adult son that can assist pt if needed, is independent with all aspects of her care, continues to drive, checks blood pressure maybe once weekly, tries to adhere to high calorie diet, drinks Boost plus and pedialyte and a lot of water due to chronic nausea (which is not as severe as it was) and had weight loss over the past few months and has gained almost all of the weight back.  Pt is trying to maintain her weight and not lose.  Pt to follow up with GI specialist on 01/18/22 for further tests and evaluation of chronic nausea.  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  Pt just finished forteo injections for osteoporosis.  RNCM Clinical Goal(s):  Patient will verbalize understanding of plan for management of HTN and HLD as evidenced by patient report, review of EHR and  through collaboration with RN Care manager, provider, and care team.   Interventions: 1:1 collaboration with primary care provider regarding development and update of comprehensive plan of care as evidenced by provider attestation and co-signature Inter-disciplinary care team collaboration (see longitudinal plan of care) Evaluation of current treatment plan related to  self management and patient's adherence to plan as established by provider   Hyperlipidemia:  (Status: New goal.) Long Term Goal  Lab Results  Component Value Date   CHOL 147 03/15/2021   HDL 69 03/15/2021   [place] 64 03/15/2021    TRIG 68 03/15/2021   CHOLHDL 2.1 03/15/2021     Medication review performed; medication list updated in electronic medical record.  Counseled on importance of regular laboratory monitoring as prescribed; Provided HLD educational materials; Reviewed importance of limiting foods high in cholesterol; Reviewed exercise goals and target of 150 minutes per week; Screening for signs and symptoms of depression related to chronic disease state;  Assessed social determinant of health barriers;  Education sent via My Chart- heart healthy diet Pain assessment completed Reviewed strategies for alleviating nausea (pt has taken meclizine in the past) and importance of discussing with GI doctor  Hypertension Interventions:  (Status:  New goal.) Long Term Goal Last practice recorded BP readings:  BP Readings from Last 3 Encounters:  09/20/21 102/64  08/09/21 104/63  06/09/21 119/70  Most recent eGFR/CrCl:  Lab Results  Component Value Date   EGFR 75 03/15/2021    No components found for: CRCL  Evaluation of current treatment plan related to hypertension self management and patient's adherence to plan as established by provider Reviewed medications with patient and discussed importance of compliance Discussed plans with patient for ongoing care management follow up and provided patient  with direct contact information for care management team Advised patient, providing education and rationale, to monitor blood pressure daily and record, calling PCP for findings outside established parameters Discussed complications of poorly controlled blood pressure such as heart disease, stroke, circulatory complications, vision complications, kidney impairment, sexual dysfunction Screening for signs and symptoms of depression related to chronic disease state  Assessed social determinant of health barriers  Education sent via My Chart- low sodium diet  Patient Goals/Self-Care Activities: Take medications as  prescribed   Attend all scheduled provider appointments Perform all self care activities independently  Perform IADL's (shopping, preparing meals, housekeeping, managing finances) independently Call provider office for new concerns or questions  check blood pressure 3 times per week write blood pressure results in a log or diary keep a blood pressure log take blood pressure log to all doctor appointments take medications for blood pressure exactly as prescribed - take all medications exactly as prescribed - call doctor when you experience any new symptoms - adhere to prescribed diet: heart healthy Continue drinking Boost plus as needed Look over education sent via My Chart- low sodium diet, heart healthy diet Practice relaxation and keep stress to a minimum       Plan:Telephone follow up appointment with care management team member scheduled for:  01/25/2022  Jacqlyn Larsen [facility], BSN RN Case Manager [place] [phone]

## 2021-11-30 NOTE — Patient Instructions (Signed)
Visit Information   Thank you for taking time to visit with me today. Please don't hesitate to contact me if I can be of assistance to you before our next scheduled telephone appointment.  Following are the goals we discussed today:  Take medications as prescribed   Attend all scheduled provider appointments Perform all self care activities independently  Perform IADL's (shopping, preparing meals, housekeeping, managing finances) independently Call provider office for new concerns or questions  check blood pressure 3 times per week write blood pressure results in a log or diary keep a blood pressure log take blood pressure log to all doctor appointments take medications for blood pressure exactly as prescribed take all medications exactly as prescribed call doctor when you experience any new symptoms  Heart-Healthy Eating Plan Many factors influence your heart (coronary) health, including eating and exercise habits. Coronary risk increases with abnormal blood fat (lipid) levels. Heart-healthy meal planning includes limiting unhealthy fats, increasing healthy fats, and making other diet and lifestyle changes. What is my plan? Your health care provider may recommend that you: Limit your fat intake to _________% or less of your total calories each day. Limit your saturated fat intake to _________% or less of your total calories each day. Limit the amount of cholesterol in your diet to less than _________ mg per day. What are tips for following this plan? Cooking Cook foods using methods other than frying. Baking, boiling, grilling, and broiling are all good options. Other ways to reduce fat include: Removing the skin from poultry. Removing all visible fats from meats. Steaming vegetables in water or broth. Meal planning  At meals, imagine dividing your plate into fourths: Fill one-half of your plate with vegetables and green salads. Fill one-fourth of your plate with whole  grains. Fill one-fourth of your plate with lean protein foods. Eat 4-5 servings of vegetables per day. One serving equals 1 cup raw or cooked vegetable, or 2 cups raw leafy greens. Eat 4-5 servings of fruit per day. One serving equals 1 medium whole fruit,  cup dried fruit,  cup fresh, frozen, or canned fruit, or  cup 100% fruit juice. Eat more foods that contain soluble fiber. Examples include apples, broccoli, carrots, beans, peas, and barley. Aim to get 25-30 g of fiber per day. Increase your consumption of legumes, nuts, and seeds to 4-5 servings per week. One serving of dried beans or legumes equals  cup cooked, 1 serving of nuts is  cup, and 1 serving of seeds equals 1 tablespoon. Fats Choose healthy fats more often. Choose monounsaturated and polyunsaturated fats, such as olive and canola oils, flaxseeds, walnuts, almonds, and seeds. Eat more omega-3 fats. Choose salmon, mackerel, sardines, tuna, flaxseed oil, and ground flaxseeds. Aim to eat fish at least 2 times each week. Check food labels carefully to identify foods with trans fats or high amounts of saturated fat. Limit saturated fats. These are found in animal products, such as meats, butter, and cream. Plant sources of saturated fats include palm oil, palm kernel oil, and coconut oil. Avoid foods with partially hydrogenated oils in them. These contain trans fats. Examples are stick margarine, some tub margarines, cookies, crackers, and other baked goods. Avoid fried foods. General information Eat more home-cooked food and less restaurant, buffet, and fast food. Limit or avoid alcohol. Limit foods that are high in starch and sugar. Lose weight if you are overweight. Losing just 5-10% of your body weight can help your overall health and prevent diseases such as diabetes  and heart disease. Monitor your salt (sodium) intake, especially if you have high blood pressure. Talk with your health care provider about your sodium intake. Try  to incorporate more vegetarian meals weekly. What foods can I eat? Fruits All fresh, canned (in natural juice), or frozen fruits. Vegetables Fresh or frozen vegetables (raw, steamed, roasted, or grilled). Green salads. Grains Most grains. Choose whole wheat and whole grains most of the time. Rice and pasta, including brown rice and pastas made with whole wheat. Meats and other proteins Lean, well-trimmed beef, veal, pork, and lamb. Chicken and [place] without skin. All fish and shellfish. Wild duck, rabbit, pheasant, and venison. Egg whites or low-cholesterol egg substitutes. Dried beans, peas, lentils, and tofu. Seeds and most nuts. Dairy Low-fat or nonfat cheeses, including ricotta and mozzarella. Skim or 1% milk (liquid, powdered, or evaporated). Buttermilk made with low-fat milk. Nonfat or low-fat yogurt. Fats and oils Non-hydrogenated (trans-free) margarines. Vegetable oils, including soybean, sesame, sunflower, olive, peanut, safflower, corn, canola, and cottonseed. Salad dressings or mayonnaise made with a vegetable oil. Beverages Water (mineral or sparkling). Coffee and tea. Diet carbonated beverages. Sweets and desserts Sherbet, gelatin, and fruit ice. Small amounts of dark chocolate. Limit all sweets and desserts. Seasonings and condiments All seasonings and condiments. The items listed above may not be a complete list of foods and beverages you can eat. Contact a dietitian for more options. What foods are not recommended? Fruits Canned fruit in heavy syrup. Fruit in cream or butter sauce. Fried fruit. Limit coconut. Vegetables Vegetables cooked in cheese, cream, or butter sauce. Fried vegetables. Grains Breads made with saturated or trans fats, oils, or whole milk. Croissants. Sweet rolls. Donuts. High-fat crackers, such as cheese crackers. Meats and other proteins Fatty meats, such as hot dogs, ribs, sausage, bacon, rib-eye roast or steak. High-fat deli meats, such as salami  and bologna. Caviar. Domestic duck and goose. Organ meats, such as liver. Dairy Cream, sour cream, cream cheese, and creamed cottage cheese. Whole-milk cheeses. Whole or 2% milk (liquid, evaporated, or condensed). Whole buttermilk. Cream sauce or high-fat cheese sauce. Whole-milk yogurt. Fats and oils Meat fat, or shortening. Cocoa butter, hydrogenated oils, palm oil, coconut oil, palm kernel oil. Solid fats and shortenings, including bacon fat, salt pork, lard, and butter. Nondairy cream substitutes. Salad dressings with cheese or sour cream. Beverages Regular sodas and any drinks with added sugar. Sweets and desserts Frosting. Pudding. Cookies. Cakes. Pies. Milk chocolate or white chocolate. Buttered syrups. Full-fat ice cream or ice cream drinks. The items listed above may not be a complete list of foods and beverages to avoid. Contact a dietitian for more information. Summary Heart-healthy meal planning includes limiting unhealthy fats, increasing healthy fats, and making other diet and lifestyle changes. Lose weight if you are overweight. Losing just 5-10% of your body weight can help your overall health and prevent diseases such as diabetes and heart disease. Focus on eating a balance of foods, including fruits and vegetables, low-fat or nonfat dairy, lean protein, nuts and legumes, whole grains, and heart-healthy oils and fats. This information is not intended to replace advice given to you by your health care provider. Make sure you discuss any questions you have with your health care provider. Document Revised: 04/07/2021 Document Reviewed: 04/07/2021 Elsevier Patient Education  2022 [place]. Low-Sodium Eating Plan Sodium, which is an element that makes up salt, helps you maintain a healthy balance of fluids in your body. Too much sodium can increase your blood pressure and cause  fluid and waste to be held in your body. Your health care provider or dietitian may recommend following  this plan if you have high blood pressure (hypertension), kidney disease, liver disease, or heart failure. Eating less sodium can help lower your blood pressure, reduce swelling, and protect your heart, liver, and kidneys. What are tips for following this plan? Reading food labels The Nutrition Facts label lists the amount of sodium in one serving of the food. If you eat more than one serving, you must multiply the listed amount of sodium by the number of servings. Choose foods with less than 140 mg of sodium per serving. Avoid foods with 300 mg of sodium or more per serving. Shopping  Look for lower-sodium products, often labeled as "low-sodium" or "no salt added." Always check the sodium content, even if foods are labeled as "unsalted" or "no salt added." Buy fresh foods. Avoid canned foods and pre-made or frozen meals. Avoid canned, cured, or processed meats. Buy breads that have less than 80 mg of sodium per slice. Cooking  Eat more home-cooked food and less restaurant, buffet, and fast food. Avoid adding salt when cooking. Use salt-free seasonings or herbs instead of table salt or sea salt. Check with your health care provider or pharmacist before using salt substitutes. Cook with plant-based oils, such as canola, sunflower, or olive oil. Meal planning When eating at a restaurant, ask that your food be prepared with less salt or no salt, if possible. Avoid dishes labeled as brined, pickled, cured, smoked, or made with soy sauce, miso, or teriyaki sauce. Avoid foods that contain MSG (monosodium glutamate). MSG is sometimes added to [place] food, bouillon, and some canned foods. Make meals that can be grilled, baked, poached, roasted, or steamed. These are generally made with less sodium. General information Most people on this plan should limit their sodium intake to 1,500-2,000 mg (milligrams) of sodium each day. What foods should I eat? Fruits Fresh, frozen, or canned fruit. Fruit  juice. Vegetables Fresh or frozen vegetables. "No salt added" canned vegetables. "No salt added" tomato sauce and paste. Low-sodium or reduced-sodium tomato and vegetable juice. Grains Low-sodium cereals, including oats, puffed wheat and rice, and shredded wheat. Low-sodium crackers. Unsalted rice. Unsalted pasta. Low-sodium bread. Whole-grain breads and whole-grain pasta. Meats and other proteins Fresh or frozen (no salt added) meat, poultry, seafood, and fish. Low-sodium canned tuna and salmon. Unsalted nuts. Dried peas, beans, and lentils without added salt. Unsalted canned beans. Eggs. Unsalted nut butters. Dairy Milk. Soy milk. Cheese that is naturally low in sodium, such as ricotta cheese, fresh mozzarella, or Swiss cheese. Low-sodium or reduced-sodium cheese. Cream cheese. Yogurt. Seasonings and condiments Fresh and dried herbs and spices. Salt-free seasonings. Low-sodium mustard and ketchup. Sodium-free salad dressing. Sodium-free light mayonnaise. Fresh or refrigerated horseradish. Lemon juice. Vinegar. Other foods Homemade, reduced-sodium, or low-sodium soups. Unsalted popcorn and pretzels. Low-salt or salt-free chips. The items listed above may not be a complete list of foods and beverages you can eat. Contact a dietitian for more information. What foods should I avoid? Vegetables Sauerkraut, pickled vegetables, and relishes. Olives. [place] fries. Onion rings. Regular canned vegetables (not low-sodium or reduced-sodium). Regular canned tomato sauce and paste (not low-sodium or reduced-sodium). Regular tomato and vegetable juice (not low-sodium or reduced-sodium). Frozen vegetables in sauces. Grains Instant hot cereals. Bread stuffing, pancake, and biscuit mixes. Croutons. Seasoned rice or pasta mixes. Noodle soup cups. Boxed or frozen macaroni and cheese. Regular salted crackers. Self-rising flour. Meats and other proteins  Meat or fish that is salted, canned, smoked, spiced, or  pickled. Precooked or cured meat, such as sausages or meat loaves. Berniece Salines. Ham. Pepperoni. Hot dogs. Corned beef. Chipped beef. Salt pork. Jerky. Pickled herring. Anchovies and sardines. Regular canned tuna. Salted nuts. Dairy Processed cheese and cheese spreads. Hard cheeses. Cheese curds. Blue cheese. Feta cheese. String cheese. Regular cottage cheese. Buttermilk. Canned milk. Fats and oils Salted butter. Regular margarine. Ghee. Bacon fat. Seasonings and condiments Onion salt, garlic salt, seasoned salt, table salt, and sea salt. Canned and packaged gravies. Worcestershire sauce. Tartar sauce. Barbecue sauce. Teriyaki sauce. Soy sauce, including reduced-sodium. Steak sauce. Fish sauce. Oyster sauce. Cocktail sauce. Horseradish that you find on the shelf. Regular ketchup and mustard. Meat flavorings and tenderizers. Bouillon cubes. Hot sauce. Pre-made or packaged marinades. Pre-made or packaged taco seasonings. Relishes. Regular salad dressings. Salsa. Other foods Salted popcorn and pretzels. Corn chips and puffs. Potato and tortilla chips. Canned or dried soups. Pizza. Frozen entrees and pot pies. The items listed above may not be a complete list of foods and beverages you should avoid. Contact a dietitian for more information. Summary Eating less sodium can help lower your blood pressure, reduce swelling, and protect your heart, liver, and kidneys. Most people on this plan should limit their sodium intake to 1,500-2,000 mg (milligrams) of sodium each day. Canned, boxed, and frozen foods are high in sodium. Restaurant foods, fast foods, and pizza are also very high in sodium. You also get sodium by adding salt to food. Try to cook at home, eat more fresh fruits and vegetables, and eat less fast food and canned, processed, or prepared foods. This information is not intended to replace advice given to you by your health care provider. Make sure you discuss any questions you have with your health care  provider. Document Revised: 01/02/2020 Document Reviewed: 10/29/2019 Elsevier Patient Education  2022 [place]. adhere to prescribed diet: heart healthy Continue drinking Boost plus as needed Look over education sent via My Chart- low sodium diet, heart healthy diet Practice relaxation and keep stress to a minimum  Our next appointment is by telephone on 01/25/2022 at 3 pm  Please call the care guide team at [phone] if you need to cancel or reschedule your appointment.   If you are experiencing a Mental Health or [place] or need someone to talk to, please call the [place] National Suicide Prevention Lifeline: (804) 246-0251 or TTY: (860)779-5023 TTY 231-292-2289) to talk to a trained counselor call 1-800-273-TALK (toll free, 24 hour hotline) go to [facility] [place] [phone]) call the [place]: [phone] call 911   Following is a copy of your full care plan:  Care Plan : RN Care Manager Plan of Care  Updates made by [name], RN since 11/30/2021 12:00 AM     Problem: No plan of care established for management of chronic disease states  (HTN, HLD, chronic pain, chronic nausea)   Priority: High     Long-Range Goal: Development of plan of care for chronic disease management  (HTN, HLD, chronic pain, chronic nausea)   Start Date: 11/30/2021  Expected End Date: 05/29/2022  Priority: High  Note:   Current Barriers:  Knowledge Deficits related to plan of care for management of HTN and HLD  Patient reports she lives alone, has adult son that can assist pt if needed, is independent with all aspects of her care, continues to drive, checks blood  pressure maybe once weekly, tries to adhere to high calorie diet, drinks Boost plus and pedialyte and a lot of water due to chronic nausea (which is not as severe as it was) and had weight loss over the past few months and has gained almost  all of the weight back.  Pt is trying to maintain her weight and not lose.  Pt to follow up with GI specialist on 01/18/22 for further tests and evaluation of chronic nausea.  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  Pt just finished forteo injections for osteoporosis.  RNCM Clinical Goal(s):  Patient will verbalize understanding of plan for management of HTN and HLD as evidenced by patient report, review of EHR and  through collaboration with RN Care manager, provider, and care team.   Interventions: 1:1 collaboration with primary care provider regarding development and update of comprehensive plan of care as evidenced by provider attestation and co-signature Inter-disciplinary care team collaboration (see longitudinal plan of care) Evaluation of current treatment plan related to  self management and patient's adherence to plan as established by provider   Hyperlipidemia:  (Status: New goal.) Long Term Goal  Lab Results  Component Value Date   CHOL 147 03/15/2021   HDL 69 03/15/2021   [place] 64 03/15/2021   TRIG 68 03/15/2021   CHOLHDL 2.1 03/15/2021     Medication review performed; medication list updated in electronic medical record.  Counseled on importance of regular laboratory monitoring as prescribed; Provided HLD educational materials; Reviewed importance of limiting foods high in cholesterol; Reviewed exercise goals and target of 150 minutes per week; Screening for signs and symptoms of depression related to chronic disease state;  Assessed social determinant of health barriers;  Education sent via My Chart- heart healthy diet Pain assessment completed Reviewed strategies for alleviating nausea (pt has taken meclizine in the past) and importance of discussing with GI doctor  Hypertension Interventions:  (Status:  New goal.) Long Term Goal Last practice recorded BP readings:  BP Readings from Last 3 Encounters:  09/20/21 102/64  08/09/21  104/63  06/09/21 119/70  Most recent eGFR/CrCl:  Lab Results  Component Value Date   EGFR 75 03/15/2021    No components found for: CRCL  Evaluation of current treatment plan related to hypertension self management and patient's adherence to plan as established by provider Reviewed medications with patient and discussed importance of compliance Discussed plans with patient for ongoing care management follow up and provided patient with direct contact information for care management team Advised patient, providing education and rationale, to monitor blood pressure daily and record, calling PCP for findings outside established parameters Discussed complications of poorly controlled blood pressure such as heart disease, stroke, circulatory complications, vision complications, kidney impairment, sexual dysfunction Screening for signs and symptoms of depression related to chronic disease state  Assessed social determinant of health barriers  Education sent via My Chart- low sodium diet  Patient Goals/Self-Care Activities: Take medications as prescribed   Attend all scheduled provider appointments Perform all self care activities independently  Perform IADL's (shopping, preparing meals, housekeeping, managing finances) independently Call provider office for new concerns or questions  check blood pressure 3 times per week write blood pressure results in a log or diary keep a blood pressure log take blood pressure log to all doctor appointments take medications for blood pressure exactly as prescribed - take all medications exactly as prescribed - call doctor when you experience any new symptoms -  adhere to prescribed diet: heart healthy Continue drinking Boost plus as needed Look over education sent via My Chart- low sodium diet, heart healthy diet Practice relaxation and keep stress to a minimum       Consent to CCM Services: Ms. Oshields was given information about Chronic Care  Management services including:  CCM service includes personalized support from designated clinical staff supervised by her physician, including individualized plan of care and coordination with other care providers 24/7 contact phone numbers for assistance for urgent and routine care needs. Service will only be billed when office clinical staff spend 20 minutes or more in a month to coordinate care. Only one practitioner may furnish and bill the service in a calendar month. The patient may stop CCM services at any time (effective at the end of the month) by phone call to the office staff. The patient will be responsible for cost sharing (co-pay) of up to 20% of the service fee (after annual deductible is met).  Patient agreed to services and verbal consent obtained.   Patient verbalizes understanding of instructions provided today and agrees to view in [place].   Telephone follow up appointment with care management team member scheduled for:  01/25/2022

## 2021-12-08 ENCOUNTER — Ambulatory Visit: Payer: Medicare HMO | Admitting: Family Medicine

## 2021-12-10 DIAGNOSIS — E7849 Other hyperlipidemia: Secondary | ICD-10-CM

## 2021-12-10 DIAGNOSIS — I10 Essential (primary) hypertension: Secondary | ICD-10-CM

## 2022-01-19 ENCOUNTER — Ambulatory Visit: Payer: Medicare HMO | Admitting: Family Medicine

## 2022-01-20 ENCOUNTER — Ambulatory Visit (INDEPENDENT_AMBULATORY_CARE_PROVIDER_SITE_OTHER): Payer: No Typology Code available for payment source | Admitting: Family Medicine

## 2022-01-20 ENCOUNTER — Other Ambulatory Visit: Payer: Self-pay

## 2022-01-20 VITALS — BP 110/72 | HR 66 | Temp 98.1°F | Ht 63.0 in | Wt 105.0 lb

## 2022-01-20 DIAGNOSIS — I10 Essential (primary) hypertension: Secondary | ICD-10-CM

## 2022-01-20 DIAGNOSIS — R112 Nausea with vomiting, unspecified: Secondary | ICD-10-CM

## 2022-01-20 DIAGNOSIS — E7849 Other hyperlipidemia: Secondary | ICD-10-CM

## 2022-01-20 MED ORDER — LISINOPRIL 5 MG PO TABS: ORAL_TABLET | ORAL | 3 refills | Status: DC

## 2022-01-20 MED ORDER — ROSUVASTATIN CALCIUM 5 MG PO TABS: ORAL_TABLET | ORAL | 1 refills | Status: DC

## 2022-01-20 NOTE — Progress Notes (Signed)
° °  Subjective:    Patient ID: Shelly Stein, female    DOB: [birth year], 76 y.o.   MRN: [id number]  HPI  [facility] Derrill Kay from 01/18/22 Refill rosuvastatin Essential hypertension - Plan: Lipid panel, Hepatic function panel, Basic metabolic panel  Nausea and vomiting in adult - Plan: Lipid panel, Hepatic function panel, Basic metabolic panel  Other hyperlipidemia - Plan: Lipid panel, Hepatic function panel, Basic metabolic panel  Takes her blood pressure medicine watches diet stays active Persistent nausea and vomiting with some intermittent upper abdominal nausea and bloated feeling Saw gastroenterologist they are going to do extensive testing Hyperlipidemia takes her medication does need up-to-date lab work  Review of Systems     Objective:   Physical Exam  General-in no acute distress Eyes-no discharge Lungs-respiratory rate normal, CTA CV-no murmurs,RRR Extremities skin warm dry no edema Neuro grossly normal Behavior normal, alert       Assessment & Plan:   1. Essential hypertension Blood pressure good control continue current measures check lab work - Lipid panel - Hepatic function panel - Basic metabolic panel  2. Nausea and vomiting in adult Persistent nausea seen GI they did extensive work-up.  She has had significant work-up in the past including CAT scans and HIDA test.  Hopefully they can get to the bottom of what is causing her persistent nausea. - Lipid panel - Hepatic function panel - Basic metabolic panel  3. Other hyperlipidemia Refills given Labs ordered patient to follow-up in approximately 5 to 6 months - Lipid panel - Hepatic function panel - Basic metabolic panel

## 2022-01-25 ENCOUNTER — Telehealth: Payer: Medicare HMO

## 2022-02-01 ENCOUNTER — Ambulatory Visit: Payer: Medicare HMO | Admitting: *Deleted

## 2022-02-01 DIAGNOSIS — E7849 Other hyperlipidemia: Secondary | ICD-10-CM

## 2022-02-01 DIAGNOSIS — R112 Nausea with vomiting, unspecified: Secondary | ICD-10-CM

## 2022-02-01 DIAGNOSIS — I10 Essential (primary) hypertension: Secondary | ICD-10-CM

## 2022-02-01 NOTE — Chronic Care Management (AMB) (Signed)
° Care Management °  ° RN Visit Note ° °02/01/2022 °Name: Shelly Stein MRN: [id number] DOB: 03/27/1946 ° °Subjective: °Shelly Stein is a 76 y.o. year old female who is a primary care patient of Luking, Scott A, MD. The care management team was consulted for assistance with disease management and care coordination needs.   ° °Engaged with patient by telephone for follow up visit in response to provider referral for case management and/or care coordination services.  ° °Consent to Services:  ° [name] was given information about Care Management services today including:  °Care Management services includes personalized support from designated clinical staff supervised by her physician, including individualized plan of care and coordination with other care providers °24/7 contact phone numbers for assistance for urgent and routine care needs. °The patient may stop case management services at any time by phone call to the office staff. ° °Patient agreed to services and consent obtained.  ° °Assessment: Review of patient past medical history, allergies, medications, health status, including review of consultants reports, laboratory and other test data, was performed as part of comprehensive evaluation and provision of chronic care management services.  ° °SDOH (Social Determinants of Health) assessments and interventions performed:   ° °Care Plan ° °No Known Allergies ° °Outpatient Encounter Medications as of 02/01/2022  °Medication Sig  ° allopurinol (ZYLOPRIM) 100 MG tablet 2 (two) times daily.   ° Ascorbic Acid (VITAMIN C) 1000 MG tablet Take 1,000 mg by mouth daily.  ° Cholecalciferol (VITAMIN D3) 50 MCG (2000 UT) capsule   ° dicyclomine (BENTYL) 10 MG capsule Take 10 mg by mouth 3 (three) times daily.  ° ferrous sulfate 325 (65 FE) MG tablet Take 325 mg by mouth daily with breakfast.  ° Fish Oil OIL Take 1,000 mg by mouth.  ° folic acid (FOLVITE) 1 MG tablet   ° FORTEO 600 MCG/2.4ML SOPN   ° Homeopathic  Products (ARNICARE) GEL See admin instructions.  ° lisinopril (ZESTRIL) 5 MG tablet TAKE 1/2 TABLET(2.5 MG) BY MOUTH DAILY  ° Methotrexate 2.5 MG/ML SOLN   ° MULTIPLE VITAMIN-FOLIC ACID PO 1 tablet  ° OVER THE COUNTER MEDICATION Calcium and vit d3  ° pantoprazole (PROTONIX) 40 MG tablet Take 1 tablet by mouth daily.  ° RABEprazole (ACIPHEX) 20 MG tablet Take by mouth.  ° rosuvastatin (CRESTOR) 5 MG tablet TAKE 1 TABLET EVERY DAY  ° SHINGRIX injection INJECT 0.5 ML INTO MUSCLE ONCE FOR 1 DOSE (Patient not taking: Reported on 02/01/2022)  ° °No facility-administered encounter medications on file as of 02/01/2022.  ° ° °Patient Active Problem List  ° Diagnosis Date Noted  ° Tubular adenoma 09/20/2021  ° Rheumatoid arthritis, involving unspecified site, unspecified whether rheumatoid factor present (HCC) 03/15/2021  ° Other hyperlipidemia 09/13/2020  ° Microscopic hematuria 07/28/2020  ° Pain in left shoulder 10/29/2019  ° Nauseous 07/10/2019  ° Essential hypertension 07/23/2018  ° Abdominal pain, left lower quadrant 03/07/2017  ° Nausea and vomiting in adult 03/07/2017  ° Rectal bleeding 03/07/2017  ° Sciatica of left side 12/12/2013  ° Osteoporosis 06/28/2011  ° ° °Conditions to be addressed/monitored: HTN and HLD ° °Care Plan : RN Care Manager Plan of Care  °Updates made by Farmer, Julie A, RN since 02/01/2022 12:00 AM  °  ° °Problem: No plan of care established for management of chronic disease states  (HTN, HLD, chronic pain, chronic nausea)   °Priority: High  °  ° °Long-Range Goal: Development of plan of care for   for chronic disease management  (HTN, HLD, chronic pain, chronic nausea)   Start Date: 11/30/2021  Expected End Date: 05/29/2022  Priority: High  Note:   Current Barriers:  Knowledge Deficits related to plan of care for management of HTN and HLD  Patient reports she lives alone, has adult son that can assist pt if needed, is independent with all aspects of her care, continues to drive, checks blood  pressure maybe once weekly, tries to adhere to high calorie diet, drinks Boost plus and pedialyte and a lot of water due to chronic nausea (which is about the same, no worse) and had weight loss over the past few months and has gained almost all of the weight back.  Pt is trying to maintain her weight and not lose.  Pt did follow up with GI specialist Scherrie November (in [facility]) on 01/18/22 and to follow up in 9 weeks after this date for further tests and evaluation of chronic nausea, to have EGD tomorrow 02/02/22 and another test on 02/03/22 "for stomach emptying". 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  Pt just finished forteo injections for osteoporosis. Patient reports she is checking blood pressure on occasion. Patient reports she now has new insurance as of January 2023-  [employer].  RNCM Clinical Goal(s):  Patient will verbalize understanding of plan for management of HTN and HLD as evidenced by patient report, review of EHR and  through collaboration with RN Care manager, provider, and care team.   Interventions: 1:1 collaboration with primary care provider regarding development and update of comprehensive plan of care as evidenced by provider attestation and co-signature Inter-disciplinary care team collaboration (see longitudinal plan of care) Evaluation of current treatment plan related to  self management and patient's adherence to plan as established by provider   Hyperlipidemia:  (Status: New goal.) Long Term Goal  Lab Results  Component Value Date   CHOL 147 03/15/2021   HDL 69 03/15/2021   [place] 64 03/15/2021   TRIG 68 03/15/2021   CHOLHDL 2.1 03/15/2021     Medication review performed; medication list updated in electronic medical record.  Counseled on importance of regular laboratory monitoring as prescribed; Reviewed importance of limiting foods high in cholesterol; Reinforced heart healthy diet Pain assessment  completed Reinforced strategies for alleviating nausea (pt has taken meclizine in the past) and importance of discussing with GI doctor  Hypertension Interventions:  (Status:  New goal.) Long Term Goal Last practice recorded BP readings:  BP Readings from Last 3 Encounters:  09/20/21 102/64  08/09/21 104/63  06/09/21 119/70  Most recent eGFR/CrCl:  Lab Results  Component Value Date   EGFR 75 03/15/2021    No components found for: CRCL  Evaluation of current treatment plan related to hypertension self management and patient's adherence to plan as established by provider Reviewed medications with patient and discussed importance of compliance Advised patient, providing education and rationale, to monitor blood pressure daily and record, calling PCP for findings outside established parameters  Reinforced low sodium diet  Patient Goals/Self-Care Activities: Take medications as prescribed   Attend all scheduled provider appointments Perform all self care activities independently  Perform IADL's (shopping, preparing meals, housekeeping, managing finances) independently Call provider office for new concerns or questions  check blood pressure 3 times per week write blood pressure results in a log or diary keep a blood pressure log take blood pressure log to all doctor appointments take medications for blood pressure exactly  prescribed °- take all medications exactly as prescribed °- call doctor when you experience any new symptoms °- adhere to prescribed diet: heart healthy °Continue drinking Boost plus as needed °Eat small frequent meals °Practice relaxation and keep stress to a minimum °Continue following up with GI doctor in [place] for tests ° ° °  ° ° °Plan: Telephone follow up appointment with care management team member scheduled for:  03/08/22 ° °Julie Farmer RNC, BSN °RN Case Manager °[facility] °[phone] ° ° ° ° ° ° ° ° ° ° °

## 2022-02-01 NOTE — Chronic Care Management (AMB) (Deleted)
Chronic Care Management   CCM RN Visit Note  02/01/2022 Name: Shelly Stein MRN: [id number] DOB: 1946/11/28  Subjective: Shelly Stein is a 76 y.o. year old female who is a primary care patient of Luking, Elayne Snare, MD. The care management team was consulted for assistance with disease management and care coordination needs.    Engaged with patient by telephone for follow up visit in response to provider referral for case management and/or care coordination services.   Consent to Services:  The patient was given information about Chronic Care Management services, agreed to services, and gave verbal consent prior to initiation of services.  Please see initial visit note for detailed documentation.   Patient agreed to services and verbal consent obtained.   Assessment: Review of patient past medical history, allergies, medications, health status, including review of consultants reports, laboratory and other test data, was performed as part of comprehensive evaluation and provision of chronic care management services.   SDOH (Social Determinants of Health) assessments and interventions performed:    CCM Care Plan  No Known Allergies  Outpatient Encounter Medications as of 02/01/2022  Medication Sig   allopurinol (ZYLOPRIM) 100 MG tablet 2 (two) times daily.    Ascorbic Acid (VITAMIN C) 1000 MG tablet Take 1,000 mg by mouth daily.   Cholecalciferol (VITAMIN D3) 50 MCG (2000 UT) capsule    dicyclomine (BENTYL) 10 MG capsule Take 10 mg by mouth 3 (three) times daily.   ferrous sulfate 325 (65 FE) MG tablet Take 325 mg by mouth daily with breakfast.   Fish Oil OIL Take 1,000 mg by mouth.   folic acid (FOLVITE) 1 MG tablet    FORTEO 600 MCG/2.4ML SOPN    Homeopathic Products (ARNICARE) GEL See admin instructions.   lisinopril (ZESTRIL) 5 MG tablet TAKE 1/2 TABLET(2.5 MG) BY MOUTH DAILY   Methotrexate 2.5 MG/ML SOLN    MULTIPLE VITAMIN-FOLIC ACID PO 1 tablet   OVER THE COUNTER  MEDICATION Calcium and vit d3   pantoprazole (PROTONIX) 40 MG tablet Take 1 tablet by mouth daily.   RABEprazole (ACIPHEX) 20 MG tablet Take by mouth.   rosuvastatin (CRESTOR) 5 MG tablet TAKE 1 TABLET EVERY DAY   SHINGRIX injection INJECT 0.5 ML INTO MUSCLE ONCE FOR 1 DOSE (Patient not taking: Reported on 02/01/2022)   No facility-administered encounter medications on file as of 02/01/2022.    Patient Active Problem List   Diagnosis Date Noted   Tubular adenoma 09/20/2021   Rheumatoid arthritis, involving unspecified site, unspecified whether rheumatoid factor present ([place]) 03/15/2021   Other hyperlipidemia 09/13/2020   Microscopic hematuria 07/28/2020   Pain in left shoulder 10/29/2019   Nauseous 07/10/2019   Essential hypertension 07/23/2018   Abdominal pain, left lower quadrant 03/07/2017   Nausea and vomiting in adult 03/07/2017   Rectal bleeding 03/07/2017   Sciatica of left side 12/12/2013   Osteoporosis 06/28/2011    Conditions to be addressed/monitored:HTN and HLD  Care Plan : RN Care Manager Plan of Care  Updates made by [name], RN since 02/01/2022 12:00 AM     Problem: No plan of care established for management of chronic disease states  (HTN, HLD, chronic pain, chronic nausea)   Priority: High     Long-Range Goal: Development of plan of care for chronic disease management  (HTN, HLD, chronic pain, chronic nausea)   Start Date: 11/30/2021  Expected End Date: 05/29/2022  Priority: High  Note:   Current Barriers:  Knowledge Deficits related to  plan of care for management of HTN and HLD  Patient reports she lives alone, has adult son that can assist pt if needed, is independent with all aspects of her care, continues to drive, checks blood pressure maybe once weekly, tries to adhere to high calorie diet, drinks Boost plus and pedialyte and a lot of water due to chronic nausea (which is about the same, no worse) and had weight loss over the past few months and  has gained almost all of the weight back.  Pt is trying to maintain her weight and not lose.  Pt did follow up with GI specialist Scherrie November (in [facility]) on 01/18/22 and to follow up in 9 weeks after this date for further tests and evaluation of chronic nausea, to have EGD tomorrow 02/02/22 and another test on 02/03/22 "for stomach emptying". 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  Pt just finished forteo injections for osteoporosis. Patient reports she is checking blood pressure on occasion. Patient reports she now has new insurance as of January 2023-  [employer].  RNCM Clinical Goal(s):  Patient will verbalize understanding of plan for management of HTN and HLD as evidenced by patient report, review of EHR and  through collaboration with RN Care manager, provider, and care team.   Interventions: 1:1 collaboration with primary care provider regarding development and update of comprehensive plan of care as evidenced by provider attestation and co-signature Inter-disciplinary care team collaboration (see longitudinal plan of care) Evaluation of current treatment plan related to  self management and patient's adherence to plan as established by provider   Hyperlipidemia:  (Status: New goal.) Long Term Goal  Lab Results  Component Value Date   CHOL 147 03/15/2021   HDL 69 03/15/2021   [place] 64 03/15/2021   TRIG 68 03/15/2021   CHOLHDL 2.1 03/15/2021     Medication review performed; medication list updated in electronic medical record.  Counseled on importance of regular laboratory monitoring as prescribed; Reviewed importance of limiting foods high in cholesterol; Reinforced heart healthy diet Pain assessment completed Reinforced strategies for alleviating nausea (pt has taken meclizine in the past) and importance of discussing with GI doctor  Hypertension Interventions:  (Status:  New goal.) Long Term Goal Last practice recorded BP readings:   BP Readings from Last 3 Encounters:  09/20/21 102/64  08/09/21 104/63  06/09/21 119/70  Most recent eGFR/CrCl:  Lab Results  Component Value Date   EGFR 75 03/15/2021    No components found for: CRCL  Evaluation of current treatment plan related to hypertension self management and patient's adherence to plan as established by provider Reviewed medications with patient and discussed importance of compliance Advised patient, providing education and rationale, to monitor blood pressure daily and record, calling PCP for findings outside established parameters  Reinforced low sodium diet  Patient Goals/Self-Care Activities: Take medications as prescribed   Attend all scheduled provider appointments Perform all self care activities independently  Perform IADL's (shopping, preparing meals, housekeeping, managing finances) independently Call provider office for new concerns or questions  check blood pressure 3 times per week write blood pressure results in a log or diary keep a blood pressure log take blood pressure log to all doctor appointments take medications for blood pressure exactly as prescribed - take all medications exactly as prescribed - call doctor when you experience any new symptoms - adhere to prescribed diet: heart healthy Continue drinking Boost plus as needed Eat small frequent meals  Practice relaxation and keep stress to a minimum Continue following up with GI doctor in [facility] for tests       Plan:Telephone follow up appointment with care management team member scheduled for:  03/08/22  Jacqlyn Larsen [facility], BSN RN Case Manager [place] [phone]

## 2022-02-01 NOTE — Patient Instructions (Signed)
Visit Information  Thank you for taking time to visit with me today. Please don't hesitate to contact me if I can be of assistance to you before our next scheduled telephone appointment.  Following are the goals we discussed today:  Take medications as prescribed   Attend all scheduled provider appointments Perform all self care activities independently  Perform IADL's (shopping, preparing meals, housekeeping, managing finances) independently Call provider office for new concerns or questions  check blood pressure 3 times per week write blood pressure results in a log or diary keep a blood pressure log take blood pressure log to all doctor appointments take medications for blood pressure exactly as prescribed take all medications exactly as prescribed call doctor when you experience any new symptoms adhere to prescribed diet: heart healthy Continue drinking Boost plus as needed Eat small frequent meals Practice relaxation and keep stress to a minimum Continue following up with GI doctor in [place] for tests  Our next appointment is by telephone on 03/08/22 at 130 pm  Please call the care guide team at [phone] if you need to cancel or reschedule your appointment.   If you are experiencing a Mental Health or [place] or need someone to talk to, please call the [place] National Suicide Prevention Lifeline: 808 280 4500 or TTY: 4752606196 TTY 8204272002) to talk to a trained counselor call 1-800-273-TALK (toll free, 24 hour hotline) go to [facility] [address] [place] [phone]) call the [place]: [phone] call 911   Patient verbalizes understanding of instructions and care plan provided today and agrees to view in [place]. Active MyChart status confirmed with patient.    Jacqlyn Larsen RNC, BSN RN Case Manager [facility] [phone] Nausea, Adult Nausea is  feeling like you may vomit. Feeling like you may vomit is usually not serious, but it may be an early sign of a more serious medical problem. Vomiting is when stomach contents forcefully come out of your mouth. If you vomit, or if you are not able to drink enough fluids, you may not have enough water in your body (get dehydrated). If you do not have enough water in your body, you may: Feel tired. Feel thirsty. Have a dry mouth. Have cracked lips. Pee (urinate) less often. Older adults and people who have other diseases or a weak body defense system (immune system) have a higher risk of not having enough water in the body. The main goals of treating this condition are: To relieve your nausea. To ensure your nausea occurs less often. To prevent vomiting and losing too much fluid. Follow these instructions at home: Watch your symptoms for any changes. Tell your doctor about them. Eating and drinking   Take an ORS (oral rehydration solution). This is a drink that is sold at pharmacies and stores. Drink clear fluids in small amounts as you are able. These include: Water. Ice chips. Fruit juice that has water added (diluted fruit juice). Low-calorie sports drinks. Eat bland, easy-to-digest foods in small amounts as you are able, such as: Bananas. Applesauce. Rice. Low-fat (lean) meats. Toast. Crackers. Avoid drinking fluids that have a lot of sugar or caffeine in them. This includes energy drinks, sports drinks, and soda. Avoid alcohol. Avoid spicy or fatty foods. General instructions Take over-the-counter and prescription medicines only as told by your doctor. Rest at home while you get better. Drink enough fluid to keep your pee (urine) pale yellow. Take slow and deep breaths when you  feel like you may vomit. Avoid food or things that have strong smells. Wash your hands often with soap and water for at least 20 seconds. If you cannot use soap and water, use hand sanitizer. Make  sure that everyone in your home washes their hands well and often. Keep all follow-up visits. Contact a doctor if: You feel worse. You feel like you may vomit and this lasts for more than 2 days. You vomit. You are not able to drink fluids without vomiting. You have new symptoms. You have a fever. You have a headache. You have muscle cramps. You have a rash. You have pain while peeing. You feel light-headed or dizzy. Get help right away if: You have pain in your chest, neck, arm, or jaw. You feel very weak or you faint. You have vomit that is bright red or looks like coffee grounds. You have bloody or black poop (stools) or poop that looks like tar. You have a very bad headache, a stiff neck, or both. You have very bad pain, cramping, or bloating in your belly (abdomen). You have trouble breathing or you are breathing very quickly. Your heart is beating very quickly. Your skin feels cold and clammy. You feel confused. You have signs of losing too much water in your body, such as: Dark pee, very little pee, or no pee. Cracked lips. Dry mouth. Sunken eyes. Sleepiness. Weakness. These symptoms may be an emergency. Get help right away. Call 911. Do not wait to see if the symptoms will go away. Do not drive yourself to the hospital. Summary Nausea is feeling like you are about vomit. If you vomit, or if you are not able to drink enough fluids, you may not have enough water in your body (get dehydrated). Eat and drink what your doctor tells you. Take over-the-counter and prescription medicines only as told by your doctor. Contact a doctor right away if your symptoms get worse or you have new symptoms. Keep all follow-up visits. This information is not intended to replace advice given to you by your health care provider. Make sure you discuss any questions you have with your health care provider. Document Revised: 06/03/2021 Document Reviewed: 06/03/2021 Elsevier Patient Education   [place].

## 2022-02-09 LAB — LIPID PANEL
Chol/HDL Ratio: 1.8 ratio (ref 0.0–4.4)
Cholesterol, Total: 149 mg/dL (ref 100–199)
HDL: 83 mg/dL (ref >39)
LDL Chol Calc (NIH): 54 mg/dL (ref 0–99)
Triglycerides: 60 mg/dL (ref 0–149)
VLDL Cholesterol Cal: 12 mg/dL (ref 5–40)

## 2022-02-09 LAB — HEPATIC FUNCTION PANEL
ALT: 13 IU/L (ref 0–32)
AST: 26 IU/L (ref 0–40)
Albumin: 4.4 g/dL (ref 3.7–4.7)
Alkaline Phosphatase: 49 IU/L (ref 44–121)
Bilirubin Total: 0.5 mg/dL (ref 0.0–1.2)
Bilirubin, Direct: 0.15 mg/dL (ref 0.00–0.40)
Total Protein: 6.3 g/dL (ref 6.0–8.5)

## 2022-02-09 LAB — BASIC METABOLIC PANEL
BUN/Creatinine Ratio: 18 (ref 12–28)
BUN: 15 mg/dL (ref 8–27)
CO2: 27 mmol/L (ref 20–29)
Calcium: 10.2 mg/dL (ref 8.7–10.3)
Chloride: 105 mmol/L (ref 96–106)
Creatinine, Ser: 0.83 mg/dL (ref 0.57–1.00)
Glucose: 103 mg/dL — ABNORMAL HIGH (ref 70–99)
Potassium: 4.3 mmol/L (ref 3.5–5.2)
Sodium: 144 mmol/L (ref 134–144)
eGFR: 73 mL/min/{1.73_m2} (ref >59)

## 2022-02-12 ENCOUNTER — Telehealth: Payer: Self-pay | Admitting: Family Medicine

## 2022-02-12 DIAGNOSIS — I10 Essential (primary) hypertension: Secondary | ICD-10-CM

## 2022-02-12 DIAGNOSIS — E7849 Other hyperlipidemia: Secondary | ICD-10-CM

## 2022-02-12 NOTE — Telephone Encounter (Signed)
Nurses ?The patient brought in a life line screening test that she had.  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  Unfortunately it is very difficult to know what that exactly means with the screening test.  It is my recommendation to do a carotid ultrasound because of carotid artery disease so we would know a specific percentage number in regards to buildup on the left side that would help guide any recommendations to prevent strokes down the road ? ?As long as patient consents please go ahead and set up carotid study ?

## 2022-02-13 NOTE — Telephone Encounter (Signed)
Patient notified and wants to proceed with Carotid ultrasound if that is what Dr Nicki Reaper recommends. ?Carotid U/S ordered in Epic. Referral coordinator notified. ?

## 2022-02-15 ENCOUNTER — Ambulatory Visit: Payer: No Typology Code available for payment source | Admitting: Orthopaedic Surgery

## 2022-02-15 ENCOUNTER — Other Ambulatory Visit: Payer: Self-pay

## 2022-02-27 ENCOUNTER — Other Ambulatory Visit: Payer: Self-pay | Admitting: Family Medicine

## 2022-03-01 ENCOUNTER — Other Ambulatory Visit: Payer: Self-pay

## 2022-03-01 ENCOUNTER — Ambulatory Visit: Payer: No Typology Code available for payment source | Admitting: Orthopaedic Surgery

## 2022-03-01 ENCOUNTER — Encounter: Payer: Self-pay | Admitting: Orthopaedic Surgery

## 2022-03-01 DIAGNOSIS — M47812 Spondylosis without myelopathy or radiculopathy, cervical region: Secondary | ICD-10-CM | POA: Diagnosis not present

## 2022-03-01 DIAGNOSIS — M7542 Impingement syndrome of left shoulder: Secondary | ICD-10-CM | POA: Diagnosis not present

## 2022-03-01 MED ORDER — BUPIVACAINE HCL 0.25 % IJ SOLN
2.0000 mL | INTRAMUSCULAR | Status: AC | PRN
  Administered 2022-03-01: 2 mL via INTRA_ARTICULAR

## 2022-03-01 NOTE — Progress Notes (Signed)
? ?Office Visit Note ?  ?Patient: Shelly Stein           ?Date of Birth: 1946-05-17           ?MRN: [id number] ?Visit Date: 03/01/2022 ?             ?Requested by: Shelly Drown, MD ?[address] ?[address] ?[place],  [place] [address] ?PCP: Shelly Drown, MD ? ? ?Assessment & Plan: ?Visit Diagnoses:  ?1. Impingement syndrome of left shoulder   ?2. Spondylosis of cervical region without myelopathy or radiculopathy   ? ? ?Plan: Shelly Stein visited the office today for follow-up evaluation of her chronic cervical spine pain and recurrent left shoulder pain.  I saw her several years ago with regards to the shoulder.  I performed a subacromial cortisone injection and she relates that it made a "big difference".  She recently has had some recurrence of her pain particularly with overhead motion.  She has been doing a lot of moving of boxes and feels like that may have aggravated her pain.  Concerned she may have a rotator cuff tear and discussed the different possibilities including an MRI scan but she prefer to try the injection and then let me know if she wants to proceed with an MRI scan.  She would like to avoid surgery. ? ?In addition she has a chronic history of cervical spine pain.  She is seen a number of physicians and has had injections.  She had an MRI scan in May 2022 of the cervical spine demonstrated significant degenerative changes at C3-4, C4-5, C5-6 and C6-7.  There was mild canal stenosis at C5-6 and C6-7 and severe foraminal stenosis on the left at C5-6 and C6-7.  She has had physical therapy.  She does take occasional Tylenol.  She also has a history of rheumatoid arthritis and is on methotrexate and folic acid.  She has completed a course of Forteo for her osteoporosis and relates that her follow-up bone scan density reveals some improvement.  There is no evidence of any neurologic deficit and today she was not symptomatic.  I have urged her to continue with her exercises from her physical  therapy sessions and we will be happy to see her back at any time. ? ?Follow-Up Instructions: Return if symptoms worsen or fail to improve.  ? ?Orders:  ?No orders of the defined types were placed in this encounter. ? ?No orders of the defined types were placed in this encounter. ? ? ? ? Procedures: ?Large Joint Inj: L subacromial bursa on 03/01/2022 9:48 AM ?Indications: pain and diagnostic evaluation ?Details: 25 G 1.5 in needle, anterolateral approach ? ?Arthrogram: No ? ?Medications: 2 mL bupivacaine 0.25 % ? ?12 mg betamethasone injected with Marcaine in the subacromial space left shoulder ?Consent was given by the patient. Immediately prior to procedure a time out was called to verify the correct patient, procedure, equipment, support staff and site/side marked as required. Patient was prepped and draped in the usual sterile fashion.  ? ? ? ? ?Clinical Data: ?No additional findings. ? ? ?Subjective: ?Chief Complaint  ?Patient presents with  ? Left Shoulder - Pain  ? Neck - Pain  ?Patient presents today for her left shoulder and neck. She said that she received a left shoulder injection in the past and it helped. She would like to get a cortisone injection today. She also wants to talk about her neck. She has had neck pain for a few years.  She said that she has no numbness or pain in her arms. She has had a prior MRI and also went through physical therapy. She was told that she needed surgery, but does not want to that. She just wants to get Dr.Tamre Cass's opinion. She takes Tylenol if needed.  ? ?HPI ? ?Review of Systems ? ? ?Objective: ?Vital Signs: There were no vitals taken for this visit. ? ?Physical Exam ?Constitutional:   ?   Appearance: She is well-developed.  ?Pulmonary:  ?   Effort: Pulmonary effort is normal.  ?Skin: ?   General: Skin is warm and dry.  ?Neurological:  ?   Mental Status: She is alert and oriented to person, place, and time.  ?Psychiatric:     ?   Behavior: Behavior normal.  ? ? ?Ortho  Exam alert and oriented x3.  Comfortable sitting.  No significant pain with range of motion of cervical spine.  She can touch her chin to her chest and has about 70% of rotation of the right and the left without referred pain to the interscapular region or either shoulder.  She did have some loss of neck extension related to the arthritis.  No percussible tenderness about the cervical spine.  Good grip and release to both upper extremities sensory exam was intact. ? ?Left shoulder with painful overhead arc of motion.  There was positive impingement and empty can testing.  Negative Speed sign.  Some pain along the anterior subacromial region with a little bit of clicking.  Biceps is intact.  Skin intact.  No pain at the hypertrophic [facility] joint good strength with internal and external rotation ? ?Specialty Comments:  ?No specialty comments available. ? ?Imaging: ?No results found. ? ? ?PMFS History: ?Patient Active Problem List  ? Diagnosis Date Noted  ? Impingement syndrome of left shoulder 03/01/2022  ? 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 Tubular adenoma 09/20/2021  ? Rheumatoid arthritis, involving unspecified site, unspecified whether rheumatoid factor present ([place]) 03/15/2021  ? Other hyperlipidemia 09/13/2020  ? Microscopic hematuria 07/28/2020  ? Pain in left shoulder 10/29/2019  ? Nauseous 07/10/2019  ? Essential hypertension 07/23/2018  ? Abdominal pain, left lower quadrant 03/07/2017  ? Nausea and vomiting in adult 03/07/2017  ? Rectal bleeding 03/07/2017  ? Sciatica of left side 12/12/2013  ? Osteoporosis 06/28/2011  ? ?Past Medical History:  ?Diagnosis Date  ? Abnormal MRI   ? Back pain   ? Benign cyst of breast, left   ? Diverticulosis   ? mild  ? Gastritis   ? Headache   ? Hypertension   ? Microscopic hematuria 07/28/2020  ? Worked up by [facility] urology.  Cystoscope negative.  August 2021.  CAT scan 2018 negative  ? Nausea   ? 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 Osteoporosis   ? Sciatica of left side   ?  Tinnitus   ? Tubular adenoma 09/20/2021  ? Colon 2022 next one 2025, [place] [facility], Dr Virgina Jock  ? Unsteady gait   ?  ?Family History  ?Problem Relation Age of Onset  ? Breast cancer Mother   ? Heart disease Mother   ? Aneurysm Father   ? Colon cancer Neg Hx   ? Stomach cancer Neg Hx   ? Rectal cancer Neg Hx   ? Esophageal cancer Neg Hx   ? Liver cancer Neg Hx   ?  ?Past Surgical History:  ?Procedure Laterality Date  ? ABDOMINAL HYSTERECTOMY    ? BREAST BIOPSY Left 06/02/2016  ? ESOPHAGEAL  MANOMETRY    ? FLEXIBLE SIGMOIDOSCOPY    ? FOOT SURGERY    ? SHOULDER SURGERY    ? ?Social History  ? ?Occupational History  ? Occupation: part time  [employer]  ?Tobacco Use  ? Smoking status: Never  ? Smokeless tobacco: Never  ?Vaping Use  ? Vaping Use: Never used  ?Substance and Sexual Activity  ? Alcohol use: No  ? Drug use: No  ? Sexual activity: Not on file  ? ? ? ? ? ? ?

## 2022-03-08 ENCOUNTER — Ambulatory Visit (INDEPENDENT_AMBULATORY_CARE_PROVIDER_SITE_OTHER): Payer: No Typology Code available for payment source | Admitting: *Deleted

## 2022-03-08 DIAGNOSIS — E7849 Other hyperlipidemia: Secondary | ICD-10-CM

## 2022-03-08 DIAGNOSIS — I10 Essential (primary) hypertension: Secondary | ICD-10-CM

## 2022-03-08 NOTE — Chronic Care Management (AMB) (Signed)
?Chronic Care Management  ? ?CCM RN Visit Note ? ?03/08/2022 ?Name: Shelly Stein MRN: [id number] DOB: 17-Oct-1946 ? ?Subjective: ?Shelly Stein is a 76 y.o. year old female who is a primary care patient of Luking, Elayne Snare, MD. The care management team was consulted for assistance with disease management and care coordination needs.   ? ?Engaged with patient by telephone for follow up visit in response to provider referral for case management and/or care coordination services.  ? ?Consent to Services:  ?The patient was given information about Chronic Care Management services, agreed to services, and gave verbal consent prior to initiation of services.  Please see initial visit note for detailed documentation.  ? ?Patient agreed to services and verbal consent obtained.  ? ?Assessment: Review of patient past medical history, allergies, medications, health status, including review of consultants reports, laboratory and other test data, was performed as part of comprehensive evaluation and provision of chronic care management services.  ? ?SDOH (Social Determinants of Health) assessments and interventions performed:   ? ?CCM Care Plan ? ?No Known Allergies ? ?Outpatient Encounter Medications as of 03/08/2022  ?Medication Sig  ? allopurinol (ZYLOPRIM) 100 MG tablet 2 (two) times daily.   ? Ascorbic Acid (VITAMIN C) 1000 MG tablet Take 1,000 mg by mouth daily.  ? Cholecalciferol (VITAMIN D3) 50 MCG (2000 UT) capsule   ? dicyclomine (BENTYL) 10 MG capsule Take 10 mg by mouth 3 (three) times daily.  ? ferrous sulfate 325 (65 FE) MG tablet Take 325 mg by mouth daily with breakfast.  ? Fish Oil OIL Take 1,000 mg by mouth.  ? folic acid (FOLVITE) 1 MG tablet   ? Homeopathic Products (ARNICARE) GEL See admin instructions.  ? lisinopril (ZESTRIL) 5 MG tablet TAKE 1/2 TABLET(2.5 MG) BY MOUTH DAILY  ? Methotrexate 2.5 MG/ML SOLN   ? MULTIPLE VITAMIN-FOLIC ACID PO 1 tablet  ? OVER THE COUNTER MEDICATION Calcium and vit d3  ?  RABEprazole (ACIPHEX) 20 MG tablet Take by mouth.  ? rosuvastatin (CRESTOR) 5 MG tablet TAKE 1 TABLET EVERY DAY  ? SHINGRIX injection INJECT 0.5 ML INTO MUSCLE ONCE FOR 1 DOSE  ? ?No facility-administered encounter medications on file as of 03/08/2022.  ? ? ?Patient Active Problem List  ? Diagnosis Date Noted  ? Impingement syndrome of left shoulder 03/01/2022  ? 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 Tubular adenoma 09/20/2021  ? Rheumatoid arthritis, involving unspecified site, unspecified whether rheumatoid factor present ([place]) 03/15/2021  ? Other hyperlipidemia 09/13/2020  ? Microscopic hematuria 07/28/2020  ? Pain in left shoulder 10/29/2019  ? Nauseous 07/10/2019  ? Essential hypertension 07/23/2018  ? Abdominal pain, left lower quadrant 03/07/2017  ? Nausea and vomiting in adult 03/07/2017  ? Rectal bleeding 03/07/2017  ? Sciatica of left side 12/12/2013  ? Osteoporosis 06/28/2011  ? ? ?Conditions to be addressed/monitored:HTN and HLD ? ?Care Plan : RN Care Manager Plan of Care  ?Updates made by [name], RN since 03/08/2022 12:00 AM  ?  ? ?Problem: No plan of care established for management of chronic disease states  (HTN, HLD, chronic pain, chronic nausea)   ?Priority: High  ?  ? ?Long-Range Goal: Development of plan of care for chronic disease management  (HTN, HLD, chronic pain, chronic nausea)   ?Start Date: 11/30/2021  ?Expected End Date: 09/04/2022  ?Priority: High  ?Note:   ?Current Barriers:  ?Knowledge Deficits related to plan of care for management of HTN and HLD  ?Patient reports  she lives alone, has adult son that can assist pt if needed, is independent with all aspects of her care, continues to drive, checks blood pressure maybe once weekly, tries to adhere to high calorie diet, drinks Boost plus and pedialyte and a lot of water due to chronic nausea (which has improved, pt is not nauseous 24/7) and had weight loss over the past few months and has gained almost all of the weight back,  weight is now 105 pounds  Pt is trying to maintain her weight and not lose.  Pt did follow up with GI specialist Scherrie November (in [facility]) on 01/18/22 and followed up in 9 weeks after this date for further tests and evaluation of chronic nausea, had EGD 02/02/22 and another test on 02/03/22 gastric emptying test, also had biopsy and pt states " I have mild gastritis"  Pt reports she has chronic pain/ DJD and is on methotrexate once weekly.  Pt just finished forteo injections for osteoporosis. Patient reports she is checking blood pressure on occasion. ?Patient reports she now has new insurance as of January 2023-  [employer] and is planning to switch back to [place] in the next few weeks. ? ?RNCM Clinical Goal(s):  ?Patient will verbalize understanding of plan for management of HTN and HLD as evidenced by patient report, review of EHR and  through collaboration with RN Care manager, provider, and care team.  ? ?Interventions: ?1:1 collaboration with primary care provider regarding development and update of comprehensive plan of care as evidenced by provider attestation and co-signature ?Inter-disciplinary care team collaboration (see longitudinal plan of care) ?Evaluation of current treatment plan related to  self management and patient's adherence to plan as established by provider ? ? ?Hyperlipidemia:  (Status: New goal.) Long Term Goal  ?Lab Results  ?Component Value Date  ? CHOL 147 03/15/2021  ? HDL 69 03/15/2021  ? [place] 64 03/15/2021  ? TRIG 68 03/15/2021  ? CHOLHDL 2.1 03/15/2021  ?  ? ?Medication review performed; medication list updated in electronic medical record.  ?Counseled on importance of regular laboratory monitoring as prescribed; ?Reviewed importance of limiting foods high in cholesterol; ?Reviewed heart healthy diet ?Pain assessment completed ?Reviewed strategies for alleviating nausea and importance of discussing with GI doctor ?Reviewed importance of keeping stress to a  minimum ? ?Hypertension Interventions:  (Status:  New goal.) Long Term Goal ?Last practice recorded BP readings:  ?BP Readings from Last 3 Encounters:  ?09/20/21 102/64  ?08/09/21 104/63  ?06/09/21 119/70  ?Most recent eGFR/CrCl:  ?Lab Results  ?Component Value Date  ? EGFR 75 03/15/2021  ?  No components found for: CRCL ? ?Evaluation of current treatment plan related to hypertension self management and patient's adherence to plan as established by provider ?Reviewed medications with patient and discussed importance of compliance ?Counseled on the importance of exercise goals with target of 150 minutes per week ?Advised patient, providing education and rationale, to monitor blood pressure daily and record, calling PCP for findings outside established parameters  ?Reviewed low sodium diet, importance of reading labels, limiting fast food ? ?Patient Goals/Self-Care Activities: ?Take medications as prescribed   ?Attend all scheduled provider appointments ?Perform all self care activities independently  ?Perform IADL's (shopping, preparing meals, housekeeping, managing finances) independently ?Call provider office for new concerns or questions  ?check blood pressure weekly ?write blood pressure results in a log or diary ?keep a blood pressure log ?take blood pressure log to all doctor appointments ?take medications for blood pressure exactly  as prescribed ?report new symptoms to your doctor ?eat more whole grains, fruits and vegetables, lean meats and healthy fats ?- take all medications exactly as prescribed ?- call doctor when you experience any new symptoms ?- adhere to prescribed diet: heart healthy ?Continue drinking Boost plus as needed ?Eat small frequent meals ?Practice relaxation and keep stress to a minimum ?Continue following up with Dr. Derrill Kay in [facility] ? ? ?  ? ? ?Plan:Telephone follow up appointment with care management team member scheduled for:    ? ?Jacqlyn Larsen RNC, BSN ?RN Case  Manager ?[place] ?[phone] ? ? ? ? ? ? ? ? ? ?

## 2022-03-08 NOTE — Patient Instructions (Signed)
Visit Information ? ?Thank you for taking time to visit with me today. Please don't hesitate to contact me if I can be of assistance to you before our next scheduled telephone appointment. ? ?Following are the goals we discussed today:  ?Take medications as prescribed   ?Attend all scheduled provider appointments ?Perform all self care activities independently  ?Perform IADL's (shopping, preparing meals, housekeeping, managing finances) independently ?Call provider office for new concerns or questions  ?check blood pressure weekly ?write blood pressure results in a log or diary ?keep a blood pressure log ?take blood pressure log to all doctor appointments ?take medications for blood pressure exactly as prescribed ?report new symptoms to your doctor ?eat more whole grains, fruits and vegetables, lean meats and healthy fats ?take all medications exactly as prescribed ?call doctor when you experience any new symptoms ?adhere to prescribed diet: heart healthy ?Continue drinking Boost plus as needed ?Eat small frequent meals ?Practice relaxation and keep stress to a minimum ?Continue following up with Dr. Derrill Kay in [facility] ? ?Our next appointment is by telephone on 05/24/22 at 9 am ? ?Please call the care guide team at [phone] if you need to cancel or reschedule your appointment.  ? ?If you are experiencing a Mental Health or [place] or need someone to talk to, please call the Suicide and Crisis Lifeline: 988 ?call the [place] National Suicide Prevention Lifeline: 830-096-6736 or TTY: (920)623-4252 TTY 409-323-8277) to talk to a trained counselor ?call 1-800-273-TALK (toll free, 24 hour hotline) ?go to [facility] [address], [place] [phone]) ?call the [facility]: [phone] ?call 911  ? ?Patient verbalizes understanding of instructions and care plan provided today and agrees to view in [place]. Active MyChart status confirmed  with patient.   ? ?Jacqlyn Larsen RNC, BSN ?RN Case Manager ?[place] ?[phone] ? ?

## 2022-03-10 DIAGNOSIS — E7849 Other hyperlipidemia: Secondary | ICD-10-CM

## 2022-03-10 DIAGNOSIS — I10 Essential (primary) hypertension: Secondary | ICD-10-CM | POA: Diagnosis not present

## 2022-03-30 DIAGNOSIS — M0579 Rheumatoid arthritis with rheumatoid factor of multiple sites without organ or systems involvement: Secondary | ICD-10-CM | POA: Diagnosis not present

## 2022-03-30 DIAGNOSIS — M109 Gout, unspecified: Secondary | ICD-10-CM | POA: Diagnosis not present

## 2022-03-30 DIAGNOSIS — M1990 Unspecified osteoarthritis, unspecified site: Secondary | ICD-10-CM | POA: Diagnosis not present

## 2022-03-30 DIAGNOSIS — M542 Cervicalgia: Secondary | ICD-10-CM | POA: Diagnosis not present

## 2022-03-30 DIAGNOSIS — E790 Hyperuricemia without signs of inflammatory arthritis and tophaceous disease: Secondary | ICD-10-CM | POA: Diagnosis not present

## 2022-03-30 DIAGNOSIS — K219 Gastro-esophageal reflux disease without esophagitis: Secondary | ICD-10-CM | POA: Diagnosis not present

## 2022-03-30 DIAGNOSIS — M810 Age-related osteoporosis without current pathological fracture: Secondary | ICD-10-CM | POA: Diagnosis not present

## 2022-03-30 DIAGNOSIS — Z79899 Other long term (current) drug therapy: Secondary | ICD-10-CM | POA: Diagnosis not present

## 2022-03-30 DIAGNOSIS — M79643 Pain in unspecified hand: Secondary | ICD-10-CM | POA: Diagnosis not present

## 2022-04-24 DIAGNOSIS — Z0100 Encounter for examination of eyes and vision without abnormal findings: Secondary | ICD-10-CM | POA: Diagnosis not present

## 2022-04-24 DIAGNOSIS — H2513 Age-related nuclear cataract, bilateral: Secondary | ICD-10-CM | POA: Diagnosis not present

## 2022-04-25 ENCOUNTER — Telehealth: Payer: Self-pay | Admitting: *Deleted

## 2022-04-26 DIAGNOSIS — Z681 Body mass index (BMI) 19 or less, adult: Secondary | ICD-10-CM | POA: Diagnosis not present

## 2022-04-26 DIAGNOSIS — R140 Abdominal distension (gaseous): Secondary | ICD-10-CM | POA: Diagnosis not present

## 2022-04-26 DIAGNOSIS — R634 Abnormal weight loss: Secondary | ICD-10-CM | POA: Diagnosis not present

## 2022-04-26 DIAGNOSIS — R197 Diarrhea, unspecified: Secondary | ICD-10-CM | POA: Diagnosis not present

## 2022-04-26 DIAGNOSIS — Z8719 Personal history of other diseases of the digestive system: Secondary | ICD-10-CM | POA: Diagnosis not present

## 2022-04-27 ENCOUNTER — Other Ambulatory Visit: Payer: Self-pay | Admitting: Obstetrics and Gynecology

## 2022-04-27 DIAGNOSIS — Z1231 Encounter for screening mammogram for malignant neoplasm of breast: Secondary | ICD-10-CM

## 2022-05-09 ENCOUNTER — Ambulatory Visit (HOSPITAL_COMMUNITY)
Admission: RE | Admit: 2022-05-09 | Discharge: 2022-05-09 | Disposition: A | Discharge status: Home / Self Care | Payer: Medicare HMO | Source: Ambulatory Visit | Attending: Family Medicine | Admitting: Family Medicine

## 2022-05-09 DIAGNOSIS — E7849 Other hyperlipidemia: Secondary | ICD-10-CM | POA: Insufficient documentation

## 2022-05-09 DIAGNOSIS — I6523 Occlusion and stenosis of bilateral carotid arteries: Secondary | ICD-10-CM | POA: Diagnosis not present

## 2022-05-09 DIAGNOSIS — I10 Essential (primary) hypertension: Secondary | ICD-10-CM | POA: Diagnosis not present

## 2022-05-09 DIAGNOSIS — E782 Mixed hyperlipidemia: Secondary | ICD-10-CM | POA: Diagnosis not present

## 2022-05-24 ENCOUNTER — Ambulatory Visit (INDEPENDENT_AMBULATORY_CARE_PROVIDER_SITE_OTHER): Payer: Medicare HMO | Admitting: *Deleted

## 2022-05-24 DIAGNOSIS — E7849 Other hyperlipidemia: Secondary | ICD-10-CM

## 2022-05-24 DIAGNOSIS — I10 Essential (primary) hypertension: Secondary | ICD-10-CM

## 2022-05-24 NOTE — Patient Instructions (Signed)
Visit Information  Thank you for taking time to visit with me today. Please don't hesitate to contact me if I can be of assistance to you before our next scheduled telephone appointment.  Following are the goals we discussed today:  Take medications as prescribed   Attend all scheduled provider appointments Perform all self care activities independently  Perform IADL's (shopping, preparing meals, housekeeping, managing finances) independently Call provider office for new concerns or questions  check blood pressure weekly write blood pressure results in a log or diary keep a blood pressure log take blood pressure log to all doctor appointments keep all doctor appointments take medications for blood pressure exactly as prescribed report new symptoms to your doctor eat more whole grains, fruits and vegetables, lean meats and healthy fats take all medications exactly as prescribed call doctor when you experience any new symptoms adhere to prescribed diet: heart healthy develop an exercise routine Continue drinking Boost plus as needed Eat small frequent meals Practice relaxation and keep stress to a minimum Get outside daily Continue following up with Dr. Derrill Kay in [place] symptoms with nauzene and immodium as needed Drink adequate fluids  Our next appointment is by telephone on 08/16/22 at 1045 am  Please call the care guide team at [phone] if you need to cancel or reschedule your appointment.   If you are experiencing a Mental Health or [place] or need someone to talk to, please call the Suicide and Crisis Lifeline: 988 call the [place] National Suicide Prevention Lifeline: 367-261-7220 or TTY: (902)572-7641 TTY 229-290-8134) to talk to a trained counselor call 1-800-273-TALK (toll free, 24 hour hotline) go to [facility] [address] [place] [phone]) call the [place]:  [phone] call 911   Patient verbalizes understanding of instructions and care plan provided today and agrees to view in [place]. Active MyChart status and patient understanding of how to access instructions and care plan via MyChart confirmed with patient.     Jacqlyn Larsen RNC, BSN RN Case Manager [facility] [phone]  Nausea, Adult Nausea is the feeling of having an upset stomach or that you are about to vomit. Nausea on its own is not usually a serious concern, but it may be an early sign of a more serious medical problem. As nausea gets worse, it can lead to vomiting. If vomiting develops, or if you are not able to drink enough fluids, you are at risk of becoming dehydrated. Dehydration can make you tired and thirsty, cause you to have a dry mouth, and decrease how often you urinate. Older adults and people with other diseases or a weak disease-fighting system (immune system) are at higher risk for dehydration. The main goals of treating your nausea are: To relieve your nausea. To limit repeated nausea episodes. To prevent vomiting and dehydration. Follow these instructions at home: Watch your symptoms for any changes. Tell your health care provider about them. Eating and drinking     Take an oral rehydration solution (ORS). This is a drink that is sold at pharmacies and retail stores. Drink clear fluids slowly and in small amounts as you are able. Clear fluids include water, ice chips, low-calorie sports drinks, and fruit juice that has water added (diluted fruit juice). Eat bland, easy-to-digest foods in small amounts as you are able. These foods include bananas, applesauce, rice, lean meats, toast, and crackers. Avoid drinking fluids that contain a lot of sugar or caffeine, such as  energy drinks, sports drinks, and soda. Avoid alcohol. Avoid spicy or fatty foods. General instructions Take over-the-counter and prescription medicines only as told by your health  care provider. Rest at home while you recover. Drink enough fluid to keep your urine pale yellow. Breathe slowly and deeply when you feel nauseous. Avoid smelling things that have strong odors. Wash your hands often using soap and water for at least 20 seconds. If soap and water are not available, use hand sanitizer. Make sure that everyone in your household washes their hands well and often. Keep all follow-up visits. This is important. Contact a health care provider if: Your nausea gets worse. Your nausea does not go away after two days. You vomit multiple times. You cannot drink fluids without vomiting. You have any of the following: New symptoms. A fever. A headache. Muscle cramps. A rash. Pain while urinating. You feel light-headed or dizzy. Get help right away if: You have pain in your chest, neck, arm, or jaw. You feel extremely weak or you faint. You have vomit that is bright red or looks like coffee grounds. You have bloody or black stools (feces) or stools that look like tar. You have a severe headache, a stiff neck, or both. You have severe pain, cramping, or bloating in your abdomen. You have difficulty breathing or are breathing very quickly. Your heart is beating very quickly. Your skin feels cold and clammy. You feel confused. You have signs of dehydration, such as: Dark urine, very little urine, or no urine. Cracked lips. Dry mouth. Sunken eyes. Sleepiness. Weakness. These symptoms may be an emergency. Get help right away. Call 911. Do not wait to see if the symptoms will go away. Do not drive yourself to the hospital. Summary Nausea is the feeling that you have an upset stomach or that you are about to vomit. Nausea on its own is not usually a serious concern, but it may be an early sign of a more serious medical problem. If vomiting develops, or if you are not able to drink enough fluids, you are at risk of becoming dehydrated. Follow recommendations for  eating and drinking and take over-the-counter and prescription medicines only as told by your health care provider. Contact a health care provider right away if your symptoms worsen or you have new symptoms. Keep all follow-up visits. This is important. This information is not intended to replace advice given to you by your health care provider. Make sure you discuss any questions you have with your health care provider. Document Revised: 06/03/2021 Document Reviewed: 06/03/2021 Elsevier Patient Education  [place].

## 2022-05-24 NOTE — Chronic Care Management (AMB) (Signed)
Chronic Care Management   CCM RN Visit Note  05/24/2022 Name: Shelly Stein MRN: [id number] DOB: 1946-02-21  Subjective: Shelly Stein is a 76 y.o. year old female who is a primary care patient of Luking, Elayne Snare, MD. The care management team was consulted for assistance with disease management and care coordination needs.    Engaged with patient by telephone for follow up visit in response to provider referral for case management and/or care coordination services.   Consent to Services:  The patient was given information about Chronic Care Management services, agreed to services, and gave verbal consent prior to initiation of services.  Please see initial visit note for detailed documentation.   Patient agreed to services and verbal consent obtained.   Assessment: Review of patient past medical history, allergies, medications, health status, including review of consultants reports, laboratory and other test data, was performed as part of comprehensive evaluation and provision of chronic care management services.   SDOH (Social Determinants of Health) assessments and interventions performed:    CCM Care Plan  No Known Allergies  Outpatient Encounter Medications as of 05/24/2022  Medication Sig   allopurinol (ZYLOPRIM) 100 MG tablet 2 (two) times daily.    Ascorbic Acid (VITAMIN C) 1000 MG tablet Take 1,000 mg by mouth daily.   Cholecalciferol (VITAMIN D3) 50 MCG (2000 UT) capsule    dicyclomine (BENTYL) 10 MG capsule Take 10 mg by mouth 3 (three) times daily.   ferrous sulfate 325 (65 FE) MG tablet Take 325 mg by mouth daily with breakfast.   Fish Oil OIL Take 1,000 mg by mouth.   folic acid (FOLVITE) 1 MG tablet    Homeopathic Products (ARNICARE) GEL See admin instructions.   lisinopril (ZESTRIL) 5 MG tablet TAKE 1/2 TABLET(2.5 MG) BY MOUTH DAILY   Methotrexate 2.5 MG/ML SOLN    MULTIPLE VITAMIN-FOLIC ACID PO 1 tablet   OVER THE COUNTER MEDICATION Calcium and vit d3    RABEprazole (ACIPHEX) 20 MG tablet Take by mouth.   rosuvastatin (CRESTOR) 5 MG tablet TAKE 1 TABLET EVERY DAY   SHINGRIX injection INJECT 0.5 ML INTO MUSCLE ONCE FOR 1 DOSE   No facility-administered encounter medications on file as of 05/24/2022.    Patient Active Problem List   Diagnosis Date Noted   Impingement syndrome of left shoulder 03/01/2022   Osteoarthritis cervical spine 03/01/2022   Tubular adenoma 09/20/2021   Rheumatoid arthritis, involving unspecified site, unspecified whether rheumatoid factor present ([place]) 03/15/2021   Other hyperlipidemia 09/13/2020   Microscopic hematuria 07/28/2020   Pain in left shoulder 10/29/2019   Nauseous 07/10/2019   Essential hypertension 07/23/2018   Abdominal pain, left lower quadrant 03/07/2017   Nausea and vomiting in adult 03/07/2017   Rectal bleeding 03/07/2017   Sciatica of left side 12/12/2013   Osteoporosis 06/28/2011    Conditions to be addressed/monitored:HTN and HLD  Care Plan : RN Care Manager Plan of Care  Updates made by [name], RN since 05/24/2022 12:00 AM     Problem: No plan of care established for management of chronic disease states  (HTN, HLD, chronic pain, chronic nausea)   Priority: High     Long-Range Goal: Development of plan of care for chronic disease management  (HTN, HLD, chronic pain, chronic nausea)   Start Date: 11/30/2021  Expected End Date: 09/04/2022  Priority: High  Note:   Current Barriers:  Knowledge Deficits related to plan of care for management of HTN and HLD  Patient reports  she lives alone, has adult son that can assist pt if needed, is independent with all aspects of her care, continues to drive, checks blood pressure maybe once weekly, tries to adhere to high calorie diet, drinks Boost plus and pedialyte and a lot of water due to chronic nausea (which has improved, pt is not nauseous 24/7), takes nauzene for relief, has diarrhea on occasion, had weight loss over the past few  months and has gained almost all of the weight back, weight is now 105 pounds  Pt is trying to maintain her weight and not lose.  Pt did follow up with GI specialist Scherrie November (in [facility]) on 01/18/22 and followed up in 9 weeks after this date for further tests and evaluation of chronic nausea, had EGD 02/02/22 and another test on 02/03/22 gastric emptying test, also had biopsy and pt states " I have mild gastritis", patient followed up with Dr. Derrill Kay in May and will be having colonoscopy in the near future, having "breath test to check small intestine" in July, pt states it has been difficult for providers to "figure out what's my wrong with me"  Pt reports she has chronic pain/ DJD and is on methotrexate once weekly.  Pt just finished forteo injections for osteoporosis. Patient reports she is checking blood pressure on occasion and readings vary, on few occasions blood pressure has been on the lower side. Patient reports she now has new insurance as of January 2023-  now has [employer]  RNCM Clinical Goal(s):  Patient will verbalize understanding of plan for management of HTN and HLD as evidenced by patient report, review of EHR and  through collaboration with RN Care manager, provider, and care team.   Interventions: 1:1 collaboration with primary care provider regarding development and update of comprehensive plan of care as evidenced by provider attestation and co-signature Inter-disciplinary care team collaboration (see longitudinal plan of care) Evaluation of current treatment plan related to  self management and patient's adherence to plan as established by provider   Hyperlipidemia:  (Status: New goal.) Long Term Goal  Lab Results  Component Value Date   CHOL 147 03/15/2021   HDL 69 03/15/2021   [place] 64 03/15/2021   TRIG 68 03/15/2021   CHOLHDL 2.1 03/15/2021     Medication review performed; medication list updated in electronic medical record.  Counseled on importance of  regular laboratory monitoring as prescribed; Reviewed importance of limiting foods high in cholesterol; Reviewed exercise goals and target of 150 minutes per week; Reinforced heart healthy diet Reviewed importance of getting outside daily Pain assessment completed Reinforced strategies for alleviating nausea and importance of discussing with GI doctor Reinforced importance of keeping stress to a minimum  Hypertension Interventions:  (Status:  New goal.) Long Term Goal Last practice recorded BP readings:  BP Readings from Last 3 Encounters:  09/20/21 102/64  08/09/21 104/63  06/09/21 119/70  Most recent eGFR/CrCl:  Lab Results  Component Value Date   EGFR 75 03/15/2021    No components found for: CRCL  Evaluation of current treatment plan related to hypertension self management and patient's adherence to plan as established by provider Reviewed medications with patient and discussed importance of compliance Advised patient, providing education and rationale, to monitor blood pressure daily and record, calling PCP for findings outside established parameters Discussed complications of poorly controlled blood pressure such as heart disease, stroke, circulatory complications, vision complications, kidney impairment, sexual dysfunction  Reinforced low sodium diet, importance of reading labels, limiting  fast food  Patient Goals/Self-Care Activities: Take medications as prescribed   Attend all scheduled provider appointments Perform all self care activities independently  Perform IADL's (shopping, preparing meals, housekeeping, managing finances) independently Call provider office for new concerns or questions  check blood pressure weekly write blood pressure results in a log or diary keep a blood pressure log take blood pressure log to all doctor appointments keep all doctor appointments take medications for blood pressure exactly as prescribed report new symptoms to your doctor eat  more whole grains, fruits and vegetables, lean meats and healthy fats - take all medications exactly as prescribed - call doctor when you experience any new symptoms - adhere to prescribed diet: heart healthy - develop an exercise routine Continue drinking Boost plus as needed Eat small frequent meals Practice relaxation and keep stress to a minimum Get outside daily Continue following up with Dr. Derrill Kay in [place] symptoms with nauzene and immodium as needed Drink adequate fluids       Plan:Telephone follow up appointment with care management team member scheduled for:  08/16/22  Jacqlyn Larsen [facility], BSN RN Case Manager [place] [phone]

## 2022-05-31 ENCOUNTER — Other Ambulatory Visit: Payer: Self-pay | Admitting: Family Medicine

## 2022-06-09 DIAGNOSIS — I10 Essential (primary) hypertension: Secondary | ICD-10-CM

## 2022-06-09 DIAGNOSIS — E785 Hyperlipidemia, unspecified: Secondary | ICD-10-CM | POA: Diagnosis not present

## 2022-06-12 ENCOUNTER — Ambulatory Visit
Admission: RE | Admit: 2022-06-12 | Discharge: 2022-06-12 | Disposition: A | Discharge status: Home / Self Care | Payer: Medicare HMO | Source: Ambulatory Visit | Attending: Obstetrics and Gynecology | Admitting: Obstetrics and Gynecology

## 2022-06-12 DIAGNOSIS — Z1231 Encounter for screening mammogram for malignant neoplasm of breast: Secondary | ICD-10-CM

## 2022-06-19 ENCOUNTER — Encounter: Payer: Self-pay | Admitting: *Deleted

## 2022-06-19 ENCOUNTER — Ambulatory Visit (INDEPENDENT_AMBULATORY_CARE_PROVIDER_SITE_OTHER): Payer: Medicare HMO | Admitting: Family Medicine

## 2022-06-19 VITALS — BP 107/70 | HR 70 | Temp 97.3°F | Wt 106.2 lb

## 2022-06-19 DIAGNOSIS — I10 Essential (primary) hypertension: Secondary | ICD-10-CM | POA: Diagnosis not present

## 2022-06-19 DIAGNOSIS — E7849 Other hyperlipidemia: Secondary | ICD-10-CM

## 2022-06-19 DIAGNOSIS — D7589 Other specified diseases of blood and blood-forming organs: Secondary | ICD-10-CM | POA: Diagnosis not present

## 2022-06-19 DIAGNOSIS — M069 Rheumatoid arthritis, unspecified: Secondary | ICD-10-CM

## 2022-06-19 DIAGNOSIS — M810 Age-related osteoporosis without current pathological fracture: Secondary | ICD-10-CM | POA: Diagnosis not present

## 2022-06-19 DIAGNOSIS — D509 Iron deficiency anemia, unspecified: Secondary | ICD-10-CM | POA: Diagnosis not present

## 2022-06-19 MED ORDER — LISINOPRIL 5 MG PO TABS
ORAL_TABLET | ORAL | 2 refills | Status: DC
Start: 2022-06-19 — End: 2023-01-03

## 2022-06-19 MED ORDER — ROSUVASTATIN CALCIUM 5 MG PO TABS: 5.0000 mg | ORAL_TABLET | Freq: Every day | ORAL | 2 refills | Status: DC

## 2022-06-19 NOTE — Progress Notes (Signed)
   Subjective:    Patient ID: Shelly Stein, female    DOB: 1945-12-20, 76 y.o.   MRN: [id number]  HPI  Patient here for 5 month follow up She has had somewhat of a mystery illness with explosive diarrhea difficulty absorbing calories and difficulty gaining weight She is seen gastroenterology specialist with [facility] unfortunately has not had any progress She has a history of iron deficient anemia supposed to have another colonoscopy coming up within the next few months They will also be doing bacterial overgrowth breath test Her last CBC showed macrocytosis she is on methotrexate is on folic acid but raises into question the possibility that she may also have a B12 deficiency She does have hypertension takes her medicine regular basis watches diet Takes her cholesterol medicine regular basis watches diet  Review of Systems     Objective:   Physical Exam General-in no acute distress Eyes-no discharge Lungs-respiratory rate normal, CTA CV-no murmurs,RRR Extremities skin warm dry no edema Neuro grossly normal Behavior normal, alert        Assessment & Plan:  1. Essential hypertension Blood pressure good control continue current measures - Basic Metabolic Panel - Iron, TIBC and Ferritin Panel - Vitamin B12  2. Other hyperlipidemia No need to check lipid profile currently but did send in refills - Basic Metabolic Panel - Iron, TIBC and Ferritin Panel - Vitamin B12  3. Iron deficiency anemia, unspecified iron deficiency anemia type Iron TIBC ferritin ordered - Basic Metabolic Panel - Iron, TIBC and Ferritin Panel - Vitamin B12  4. Macrocytosis B12 ordered - Basic Metabolic Panel - Iron, TIBC and Ferritin Panel - Vitamin B12  5. Osteoporosis, unspecified osteoporosis type, unspecified pathological fracture presence Bone density this will be done later this fall - DG Bone Density  We did discuss in detail the importance of healthy diet regular  activity also getting in adequate calories she states she is taking a large amount of calories but unfortunately does not gain weight  We will wait and see what her specialist finds with their testing patient will follow-up in 6 months  She does have underlying history of rheumatoid arthritis being followed by specialist [facility] currently  Recommend flu shot in the fall

## 2022-06-20 DIAGNOSIS — K5900 Constipation, unspecified: Secondary | ICD-10-CM | POA: Diagnosis not present

## 2022-06-20 DIAGNOSIS — R634 Abnormal weight loss: Secondary | ICD-10-CM | POA: Diagnosis not present

## 2022-06-20 DIAGNOSIS — R197 Diarrhea, unspecified: Secondary | ICD-10-CM | POA: Diagnosis not present

## 2022-06-20 DIAGNOSIS — K6389 Other specified diseases of intestine: Secondary | ICD-10-CM | POA: Diagnosis not present

## 2022-06-20 DIAGNOSIS — R110 Nausea: Secondary | ICD-10-CM | POA: Diagnosis not present

## 2022-06-20 DIAGNOSIS — R1084 Generalized abdominal pain: Secondary | ICD-10-CM | POA: Diagnosis not present

## 2022-06-20 LAB — BASIC METABOLIC PANEL
BUN/Creatinine Ratio: 23 (ref 12–28)
BUN: 20 mg/dL (ref 8–27)
CO2: 23 mmol/L (ref 20–29)
Calcium: 9.9 mg/dL (ref 8.7–10.3)
Chloride: 104 mmol/L (ref 96–106)
Creatinine, Ser: 0.88 mg/dL (ref 0.57–1.00)
Glucose: 106 mg/dL — ABNORMAL HIGH (ref 70–99)
Potassium: 4.3 mmol/L (ref 3.5–5.2)
Sodium: 142 mmol/L (ref 134–144)
eGFR: 68 mL/min/{1.73_m2} (ref >59)

## 2022-06-20 LAB — VITAMIN B12: Vitamin B-12: 1290 pg/mL — ABNORMAL HIGH (ref 232–1245)

## 2022-06-20 LAB — IRON,TIBC AND FERRITIN PANEL
Ferritin: 225 ng/mL — ABNORMAL HIGH (ref 15–150)
Iron Saturation: 55 % (ref 15–55)
Iron: 179 ug/dL — ABNORMAL HIGH (ref 27–139)
Total Iron Binding Capacity: 327 ug/dL (ref 250–450)
UIBC: 148 ug/dL (ref 118–369)

## 2022-07-05 DIAGNOSIS — M542 Cervicalgia: Secondary | ICD-10-CM | POA: Diagnosis not present

## 2022-07-05 DIAGNOSIS — E790 Hyperuricemia without signs of inflammatory arthritis and tophaceous disease: Secondary | ICD-10-CM | POA: Diagnosis not present

## 2022-07-05 DIAGNOSIS — M1990 Unspecified osteoarthritis, unspecified site: Secondary | ICD-10-CM | POA: Diagnosis not present

## 2022-07-05 DIAGNOSIS — M109 Gout, unspecified: Secondary | ICD-10-CM | POA: Diagnosis not present

## 2022-07-05 DIAGNOSIS — M0579 Rheumatoid arthritis with rheumatoid factor of multiple sites without organ or systems involvement: Secondary | ICD-10-CM | POA: Diagnosis not present

## 2022-07-05 DIAGNOSIS — K219 Gastro-esophageal reflux disease without esophagitis: Secondary | ICD-10-CM | POA: Diagnosis not present

## 2022-07-05 DIAGNOSIS — M810 Age-related osteoporosis without current pathological fracture: Secondary | ICD-10-CM | POA: Diagnosis not present

## 2022-07-05 DIAGNOSIS — M79643 Pain in unspecified hand: Secondary | ICD-10-CM | POA: Diagnosis not present

## 2022-07-05 DIAGNOSIS — Z79899 Other long term (current) drug therapy: Secondary | ICD-10-CM | POA: Diagnosis not present

## 2022-07-12 ENCOUNTER — Ambulatory Visit (INDEPENDENT_AMBULATORY_CARE_PROVIDER_SITE_OTHER): Payer: Medicare HMO | Admitting: *Deleted

## 2022-07-12 ENCOUNTER — Telehealth: Payer: Self-pay | Admitting: *Deleted

## 2022-07-12 ENCOUNTER — Encounter: Payer: Self-pay | Admitting: *Deleted

## 2022-07-12 DIAGNOSIS — E7849 Other hyperlipidemia: Secondary | ICD-10-CM

## 2022-07-12 DIAGNOSIS — I10 Essential (primary) hypertension: Secondary | ICD-10-CM

## 2022-07-12 NOTE — Telephone Encounter (Signed)
If rheumatology did a bone density test then we do not need to do one Is this test within epic currently?  Or can we at least get a copy?

## 2022-07-12 NOTE — Patient Instructions (Signed)
Visit Information  Thank you for taking time to visit with me today. Please don't hesitate to contact me if I can be of assistance to you before our next scheduled telephone appointment.  Following are the goals we discussed today:  Take medications as prescribed   Attend all scheduled provider appointments Perform all self care activities independently  Perform IADL's (shopping, preparing meals, housekeeping, managing finances) independently Call provider office for new concerns or questions  check blood pressure weekly write blood pressure results in a log or diary keep a blood pressure log take blood pressure log to all doctor appointments keep all doctor appointments take medications for blood pressure exactly as prescribed report new symptoms to your doctor eat more whole grains, fruits and vegetables, lean meats and healthy fats - take all medications exactly as prescribed - call doctor with any symptoms you believe are related to your medicine - call doctor when you experience any new symptoms - adhere to prescribed diet: heart healthy - develop an exercise routine Continue drinking Boost plus as needed Eat small frequent meals Practice relaxation and keep stress to a minimum Get outside daily Continue following up with Dr. Derrill Kay in [place] managing symptoms with nauzene and immodium as needed Drink adequate fluids Case closure today, please talk with your doctor if you have any future care management needs   Please call the care guide team at [phone] if you need to cancel or reschedule your appointment.   If you are experiencing a Mental Health or [place] or need someone to talk to, please call the Suicide and Crisis Lifeline: 988 call the [place] National Suicide Prevention Lifeline: 619 449 9306 or TTY: 762-803-6547 TTY (419) 603-7910) to talk to a trained counselor call 1-800-273-TALK (toll free, 24 hour hotline) go to [facility] [address] [place] [phone]) call the [place]: [phone] call 911   Patient verbalizes understanding of instructions and care plan provided today and agrees to view in [place]. Active MyChart status and patient understanding of how to access instructions and care plan via MyChart confirmed with patient.     Jacqlyn Larsen [facility], BSN RN Case Manager [facility] [phone]

## 2022-07-12 NOTE — Telephone Encounter (Signed)
Patient states she is scheduled for a bone density test in September but wants to make sure you saw the bone test that rheumatology done to make sure she still needs it or if she can cancel it.

## 2022-07-12 NOTE — Telephone Encounter (Signed)
No need for [place] to order a bone density I reviewed over the notes that were pointed out in rheumatology will be following this

## 2022-07-12 NOTE — Chronic Care Management (AMB) (Signed)
Chronic Care Management   CCM RN Visit Note  07/12/2022 Name: Shelly Stein MRN: [id number] DOB: 12/20/1945  Subjective: Shelly Stein is a 76 y.o. year old female who is a primary care patient of Shelly Stein, Shelly Snare, MD. The care management team was consulted for assistance with disease management and care coordination needs.    Engaged with patient by telephone for follow up visit in response to provider referral for case management and/or care coordination services.   Consent to Services:  The patient was given information about Chronic Care Management services, agreed to services, and gave verbal consent prior to initiation of services.  Please see initial visit note for detailed documentation.   Patient agreed to services and verbal consent obtained.   Assessment: Review of patient past medical history, allergies, medications, health status, including review of consultants reports, laboratory and other test data, was performed as part of comprehensive evaluation and provision of chronic care management services.   SDOH (Social Determinants of Health) assessments and interventions performed:    CCM Care Plan  No Known Allergies  Outpatient Encounter Medications as of 07/12/2022  Medication Sig   allopurinol (ZYLOPRIM) 100 MG tablet 2 (two) times daily.    Ascorbic Acid (VITAMIN C) 1000 MG tablet Take 1,000 mg by mouth daily.   Cholecalciferol (VITAMIN D3) 50 MCG (2000 UT) capsule    dicyclomine (BENTYL) 10 MG capsule Take 10 mg by mouth 3 (three) times daily.   ferrous sulfate 325 (65 FE) MG tablet Take 325 mg by mouth daily with breakfast.   Fish Oil OIL Take 1,000 mg by mouth.   folic acid (FOLVITE) 1 MG tablet    lisinopril (ZESTRIL) 5 MG tablet TAKE 1/2 TABLET EVERY DAY   Magnesium 200 MG TABS Take by mouth daily.   Methotrexate 2.5 MG/ML SOLN    Multiple Vitamins-Minerals (CENTRUM SILVER PO) Take by mouth daily.   OVER THE COUNTER MEDICATION Calcium and vit d3    rosuvastatin (CRESTOR) 5 MG tablet Take 1 tablet (5 mg total) by mouth daily.   Homeopathic Products (ARNICARE) GEL See admin instructions. (Patient not taking: Reported on 06/19/2022)   MULTIPLE VITAMIN-FOLIC ACID PO 1 tablet (Patient not taking: Reported on 06/19/2022)   SHINGRIX injection INJECT 0.5 ML INTO MUSCLE ONCE FOR 1 DOSE (Patient not taking: Reported on 06/19/2022)   No facility-administered encounter medications on file as of 07/12/2022.    Patient Active Problem List   Diagnosis Date Noted   Impingement syndrome of left shoulder 03/01/2022   Osteoarthritis cervical spine 03/01/2022   Tubular adenoma 09/20/2021   Rheumatoid arthritis, involving unspecified site, unspecified whether rheumatoid factor present ([place]) 03/15/2021   Other hyperlipidemia 09/13/2020   Microscopic hematuria 07/28/2020   Pain in left shoulder 10/29/2019   Nauseous 07/10/2019   Essential hypertension 07/23/2018   Abdominal pain, left lower quadrant 03/07/2017   Nausea and vomiting in adult 03/07/2017   Rectal bleeding 03/07/2017   Sciatica of left side 12/12/2013   Osteoporosis 06/28/2011    Conditions to be addressed/monitored:HTN and HLD  Care Plan : RN Care Manager Plan of Care  Updates made by Shelly Mends, RN since 07/12/2022 12:00 AM     Problem: No plan of care established for management of chronic disease states  (HTN, HLD, chronic pain, chronic nausea)   Priority: High     Long-Range Goal: Development of plan of care for chronic disease management  (HTN, HLD, chronic pain, chronic nausea)   Start Date: 11/30/2021  Expected End Date: 09/04/2022  Priority: High  Note:   Current Barriers:  Knowledge Deficits related to plan of care for management of HTN and HLD  Patient reports she lives alone, has adult son that can assist pt if needed, is independent with all aspects of her care, continues to drive, checks blood pressure maybe once weekly, tries to adhere to high calorie diet, drinks  Boost plus and pedialyte and a lot of water due to chronic nausea (which has improved, pt is not nauseous 24/7), takes nauzene for relief, has diarrhea on occasion, had weight loss over the past few months and has gained almost all of the weight back, weight is now 105 pounds  Pt is trying to maintain her weight and not lose.  Pt did follow up with GI specialist Shelly Stein (in [facility]) on 01/18/22 and followed up in 9 weeks after this date for further tests and evaluation of chronic nausea, had EGD 02/02/22 and another test on 02/03/22 gastric emptying test, also had biopsy and pt states " I have mild gastritis", patient followed up with Dr. Derrill Stein in May and will be having colonoscopy in the near future, having "breath test to check small intestine" in July, pt states it has been difficult for providers to "figure out what's my wrong with me"  Pt reports she has chronic pain/ DJD and is on methotrexate once weekly.  Pt just finished forteo injections for osteoporosis. Patient reports she is checking blood pressure on occasion and readings vary, on few occasions blood pressure has been on the lower side. Patient reports she now has new insurance as of January 2023-  now has [employer] Update- pt reports she still has nausea, "no worse, no better" continues taking nauzene with relief obtained, pt states she had "breath test for intestines" and was unremarkable, has another scheduled colonoscopy in September, continues following up with GI doctor, reports has all medications and taking as prescribed.  RNCM Clinical Goal(s):  Patient will verbalize understanding of plan for management of HTN and HLD as evidenced by patient report, review of EHR and  through collaboration with RN Care manager, provider, and care team.   Interventions: 1:1 collaboration with primary care provider regarding development and update of comprehensive plan of care as evidenced by provider attestation and  co-signature Inter-disciplinary care team collaboration (see longitudinal plan of care) Evaluation of current treatment plan related to  self management and patient's adherence to plan as established by provider   Hyperlipidemia:  (Status: New goal.) Long Term Goal  Lab Results  Component Value Date   CHOL 147 03/15/2021   HDL 69 03/15/2021   [place] 64 03/15/2021   TRIG 68 03/15/2021   CHOLHDL 2.1 03/15/2021     Medication review performed; medication list updated in electronic medical record.  Counseled on importance of regular laboratory monitoring as prescribed; Reviewed importance of limiting foods high in cholesterol; Reviewed exercise goals and target of 150 minutes per week; Reinforced heart healthy diet Reinforced importance of getting outside daily Pain assessment completed Reviewed strategies for alleviating nausea and importance of discussing with GI doctor Reviewed importance of keeping stress to a minimum Reviewed plan of care including case closure today  Hypertension Interventions:  (Status:  New goal.) Long Term Goal Last practice recorded BP readings:  BP Readings from Last 3 Encounters:  09/20/21 102/64  08/09/21 104/63  06/09/21 119/70  Most recent eGFR/CrCl:  Lab Results  Component Value Date   EGFR 75 03/15/2021  No components found for: CRCL  Evaluation of current treatment plan related to hypertension self management and patient's adherence to plan as established by provider Reviewed medications with patient and discussed importance of compliance Advised patient, providing education and rationale, to monitor blood pressure daily and record, calling PCP for findings outside established parameters Discussed complications of poorly controlled blood pressure such as heart disease, stroke, circulatory complications, vision complications, kidney impairment, sexual dysfunction  Reviewed low sodium diet, importance of reading labels, limiting fast  food  Patient Goals/Self-Care Activities: Take medications as prescribed   Attend all scheduled provider appointments Perform all self care activities independently  Perform IADL's (shopping, preparing meals, housekeeping, managing finances) independently Call provider office for new concerns or questions  check blood pressure weekly write blood pressure results in a log or diary keep a blood pressure log take blood pressure log to all doctor appointments keep all doctor appointments take medications for blood pressure exactly as prescribed report new symptoms to your doctor eat more whole grains, fruits and vegetables, lean meats and healthy fats - take all medications exactly as prescribed - call doctor with any symptoms you believe are related to your medicine - call doctor when you experience any new symptoms - adhere to prescribed diet: heart healthy - develop an exercise routine Continue drinking Boost plus as needed Eat small frequent meals Practice relaxation and keep stress to a minimum Get outside daily Continue following up with Dr. Koch in [place] Continue managing symptoms with nauzene and immodium as needed Drink adequate fluids Case closure today, please talk with your doctor if you have any future care management needs       Plan:No further follow up required: case closure today  Julie Farmer RNC, BSN RN Case Manager [facility] [phone]          

## 2022-07-12 NOTE — Telephone Encounter (Signed)
Patient notified and stated she will cancel the upcoming bone density test scheduled in Sept

## 2022-07-13 NOTE — Telephone Encounter (Signed)
error 

## 2022-07-19 DIAGNOSIS — R197 Diarrhea, unspecified: Secondary | ICD-10-CM | POA: Diagnosis not present

## 2022-07-19 DIAGNOSIS — K911 Postgastric surgery syndromes: Secondary | ICD-10-CM | POA: Diagnosis not present

## 2022-08-09 ENCOUNTER — Telehealth: Payer: Medicare HMO

## 2022-08-10 DIAGNOSIS — I10 Essential (primary) hypertension: Secondary | ICD-10-CM | POA: Diagnosis not present

## 2022-08-10 DIAGNOSIS — E785 Hyperlipidemia, unspecified: Secondary | ICD-10-CM

## 2022-08-14 ENCOUNTER — Other Ambulatory Visit (HOSPITAL_BASED_OUTPATIENT_CLINIC_OR_DEPARTMENT_OTHER): Payer: Medicare HMO

## 2022-08-16 ENCOUNTER — Telehealth: Payer: Medicare HMO

## 2022-09-04 DIAGNOSIS — M810 Age-related osteoporosis without current pathological fracture: Secondary | ICD-10-CM | POA: Diagnosis not present

## 2022-09-19 ENCOUNTER — Ambulatory Visit: Payer: Medicare HMO | Admitting: Orthopaedic Surgery

## 2022-09-19 ENCOUNTER — Encounter: Payer: Self-pay | Admitting: Orthopaedic Surgery

## 2022-09-19 DIAGNOSIS — M47812 Spondylosis without myelopathy or radiculopathy, cervical region: Secondary | ICD-10-CM | POA: Diagnosis not present

## 2022-09-19 NOTE — Progress Notes (Signed)
Office Visit Note   Patient: Shelly Stein           Date of Birth: 1946/09/24           MRN: [id number] Visit Date: 09/19/2022              Requested by: Kathyrn Drown, MD [address],  [place] [id number] PCP: Kathyrn Drown, MD   Assessment & Plan: Visit Diagnoses:  1. Spondylosis of cervical region without myelopathy or radiculopathy     Plan: Shelly Stein is a pleasant 76 year old woman who presents today for a second opinion about her neck pain.  She has had a 4-year history of neck pain she had x-rays in 2019 which demonstrated advanced degenerative changes and loss of joint space in her cervical spine.  She had a small listhesis at C7-T1.  At the time she was treated by her primary care provider and did 6 months of physical therapy including dry needling.  She did not think this was particularly helpful.  She has not had much relief with pain medication.  She does take extra strength Tylenol but she is unsure if this really helps her much.  She was referred to a spine surgeon at [facility] who told her that he thought the neck step would be surgery.  She presents today wanting to know if there is any other options.  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  She does not have any radicular findings with the exception of some tingling in her hands which she says she just notices when she awakens in the morning and may be more consistent with early carpal tunnel syndrome.  A referral will be placed to Dr. Ernestina Patches to see if she would be a candidate for some facet injections.  If these were not successful certainly she could visit with Dr. Laurance Flatten to discuss surgical options  Follow-Up Instructions: Return if symptoms worsen or fail to improve.   Orders:  Orders Placed This Encounter  Procedures   Ambulatory referral to Physical  Medicine Rehab   No orders of the defined types were placed in this encounter.     Procedures: No procedures performed   Clinical Data: No additional findings.   Subjective: Chief Complaint  Patient presents with   Neck - Pain    HPI Shelly Stein is a pleasant 76 year old woman who presents today for second opinion regarding her neck pain.  She has had a 4-year history of neck pain.  She describes a crepitus with range of motion she does not have any weakness or altered sensation in her upper extremities with the exception of some tingling in the mornings in her fingers.  She tried 6 months of physical therapy in 2019 including dry needling which was not helpful.  She has not had improvement with pain medication.  She did get a recent evaluation at [facility] and was told and recommended a surgical option  Review of Systems  All other systems reviewed and are negative.    Objective: Vital Signs: There were no vitals taken for this visit.  Physical Exam Constitutional:      Appearance: Normal appearance.  Pulmonary:     Effort: Pulmonary effort is normal.  Neurological:     General: No focal deficit present.     Mental Status: She is  alert.     Ortho Exam Examination of her neck.  She has decreased motion with flexion of her neck and cannot touch her chin to her chest.  Slight decrease in extension.  She is stiff with turning her head to the left more than the right.  She has good grip strength.  Sensation is intact in her upper extremities.  Deep tendon reflexes are intact and equal.  She does say she has crepitus with range of motion Specialty Comments:  No specialty comments available.  Imaging: No results found.   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   Tubular adenoma 09/20/2021   Rheumatoid arthritis, involving unspecified site, unspecified  whether rheumatoid factor present ([place]) 03/15/2021   Other hyperlipidemia 09/13/2020   Microscopic hematuria 07/28/2020   Pain in left shoulder 10/29/2019   Nauseous 07/10/2019   Essential hypertension 07/23/2018   Abdominal pain, left lower quadrant 03/07/2017   Nausea and vomiting in adult 03/07/2017   Rectal bleeding 03/07/2017   Sciatica of left side 12/12/2013   Osteoporosis 06/28/2011   Past Medical History:  Diagnosis Date   Abnormal MRI    Back pain    Benign cyst of breast, left    Diverticulosis    mild   Gastritis    Headache    Hypertension    Microscopic hematuria 07/28/2020   Worked up by [facility].  Cystoscope negative.  August 2021.  CAT scan 2018 negative   Nausea    Nutcracker esophagus    Osteoporosis    Sciatica of left side    Tinnitus    Tubular adenoma 09/20/2021   Colon 2022 next one 2025, [facility], Dr Virgina Jock   Unsteady gait     Family History  Problem Relation Age of Onset   Breast cancer Mother    Heart disease Mother    Aneurysm Father    Colon cancer Neg Hx    Stomach cancer Neg Hx    Rectal cancer Neg Hx    Esophageal cancer Neg Hx    Liver cancer Neg Hx     Past Surgical History:  Procedure Laterality Date   ABDOMINAL HYSTERECTOMY     BREAST BIOPSY Left 06/02/2016   ESOPHAGEAL MANOMETRY     FLEXIBLE SIGMOIDOSCOPY     FOOT SURGERY     SHOULDER SURGERY     Social History   Occupational History   Occupation: part time  [employer] [place] Use   Smoking status: Never   Smokeless tobacco: Never  Vaping Use   Vaping Use: Never used  Substance and Sexual Activity   Alcohol use: No   Drug use: No   Sexual activity: Not on file

## 2022-09-25 ENCOUNTER — Ambulatory Visit (INDEPENDENT_AMBULATORY_CARE_PROVIDER_SITE_OTHER): Payer: Medicare HMO

## 2022-09-25 VITALS — Wt 106.0 lb

## 2022-09-25 DIAGNOSIS — Z0000 Encounter for general adult medical examination without abnormal findings: Secondary | ICD-10-CM

## 2022-09-25 NOTE — Patient Instructions (Signed)
Shelly Stein , Thank you for taking time to come for your Medicare Wellness Visit. I appreciate your ongoing commitment to your health goals. Please review the following plan we discussed and let me know if I can assist you in the future.   Screening recommendations/referrals: Colonoscopy: 02/10/21  Mammogram: 06/12/22 Bone Density: 12/19/21 Recommended yearly ophthalmology/optometry visit for glaucoma screening and checkup Recommended yearly dental visit for hygiene and checkup  Vaccinations: Influenza vaccine: n/d Pneumococcal vaccine: 10/23/14 Tdap vaccine: n/d Shingles vaccine: Shingrix 08/14/19, 11/04/19   Covid-19:09/15/20, 09/28/20, 10/13/20, 01/11/21  Advanced directives: no  Conditions/risks identified: none  Next appointment: Follow up in one year for your annual wellness visit 10/09/23 @ 8:15 am by phone   Preventive Care 65 Years and Older, Female Preventive care refers to lifestyle choices and visits with your health care provider that can promote health and wellness. What does preventive care include? A yearly physical exam. This is also called an annual well check. Dental exams once or twice a year. Routine eye exams. Ask your health care provider how often you should have your eyes checked. Personal lifestyle choices, including: Daily care of your teeth and gums. Regular physical activity. Eating a healthy diet. Avoiding tobacco and drug use. Limiting alcohol use. Practicing safe sex. Taking low-dose aspirin every day. Taking vitamin and mineral supplements as recommended by your health care provider. What happens during an annual well check? The services and screenings done by your health care provider during your annual well check will depend on your age, overall health, lifestyle risk factors, and family history of disease. Counseling  Your health care provider may ask you questions about your: Alcohol use. Tobacco use. Drug use. Emotional well-being. Home and  relationship well-being. Sexual activity. Eating habits. History of falls. Memory and ability to understand (cognition). Work and work Statistician. Reproductive health. Screening  You may have the following tests or measurements: Height, weight, and BMI. Blood pressure. Lipid and cholesterol levels. These may be checked every 5 years, or more frequently if you are over 62 years old. Skin check. Lung cancer screening. You may have this screening every year starting at age 36 if you have a 30-pack-year history of smoking and currently smoke or have quit within the past 15 years. Fecal occult blood test (FOBT) of the stool. You may have this test every year starting at age 81. Flexible sigmoidoscopy or colonoscopy. You may have a sigmoidoscopy every 5 years or a colonoscopy every 10 years starting at age 47. Hepatitis C blood test. Hepatitis B blood test. Sexually transmitted disease (STD) testing. Diabetes screening. This is done by checking your blood sugar (glucose) after you have not eaten for a while (fasting). You may have this done every 1-3 years. Bone density scan. This is done to screen for osteoporosis. You may have this done starting at age 21. Mammogram. This may be done every 1-2 years. Talk to your health care provider about how often you should have regular mammograms. Talk with your health care provider about your test results, treatment options, and if necessary, the need for more tests. Vaccines  Your health care provider may recommend certain vaccines, such as: Influenza vaccine. This is recommended every year. Tetanus, diphtheria, and acellular pertussis (Tdap, Td) vaccine. You may need a Td booster every 10 years. Zoster vaccine. You may need this after age 63. Pneumococcal 13-valent conjugate (PCV13) vaccine. One dose is recommended after age 61. Pneumococcal polysaccharide (PPSV23) vaccine. One dose is recommended after age 73. Talk  to your health care provider  about which screenings and vaccines you need and how often you need them. This information is not intended to replace advice given to you by your health care provider. Make sure you discuss any questions you have with your health care provider. Document Released: 12/24/2015 Document Revised: 08/16/2016 Document Reviewed: 09/28/2015 Elsevier Interactive Patient Education  2017 [place] Prevention in the Home Falls can cause injuries. They can happen to people of all ages. There are many things you can do to make your home safe and to help prevent falls. What can I do on the outside of my home? Regularly fix the edges of walkways and driveways and fix any cracks. Remove anything that might make you trip as you walk through a door, such as a raised step or threshold. Trim any bushes or trees on the path to your home. Use bright outdoor lighting. Clear any walking paths of anything that might make someone trip, such as rocks or tools. Regularly check to see if handrails are loose or broken. Make sure that both sides of any steps have handrails. Any raised decks and porches should have guardrails on the edges. Have any leaves, snow, or ice cleared regularly. Use sand or salt on walking paths during winter. Clean up any spills in your garage right away. This includes oil or grease spills. What can I do in the bathroom? Use night lights. Install grab bars by the toilet and in the tub and shower. Do not use towel bars as grab bars. Use non-skid mats or decals in the tub or shower. If you need to sit down in the shower, use a plastic, non-slip stool. Keep the floor dry. Clean up any water that spills on the floor as soon as it happens. Remove soap buildup in the tub or shower regularly. Attach bath mats securely with double-sided non-slip rug tape. Do not have throw rugs and other things on the floor that can make you trip. What can I do in the bedroom? Use night lights. Make sure  that you have a light by your bed that is easy to reach. Do not use any sheets or blankets that are too big for your bed. They should not hang down onto the floor. Have a firm chair that has side arms. You can use this for support while you get dressed. Do not have throw rugs and other things on the floor that can make you trip. What can I do in the kitchen? Clean up any spills right away. Avoid walking on wet floors. Keep items that you use a lot in easy-to-reach places. If you need to reach something above you, use a strong step stool that has a grab bar. Keep electrical cords out of the way. Do not use floor polish or wax that makes floors slippery. If you must use wax, use non-skid floor wax. Do not have throw rugs and other things on the floor that can make you trip. What can I do with my stairs? Do not leave any items on the stairs. Make sure that there are handrails on both sides of the stairs and use them. Fix handrails that are broken or loose. Make sure that handrails are as long as the stairways. Check any carpeting to make sure that it is firmly attached to the stairs. Fix any carpet that is loose or worn. Avoid having throw rugs at the top or bottom of the stairs. If you do have throw rugs,  attach them to the floor with carpet tape. Make sure that you have a light switch at the top of the stairs and the bottom of the stairs. If you do not have them, ask someone to add them for you. What else can I do to help prevent falls? Wear shoes that: Do not have high heels. Have rubber bottoms. Are comfortable and fit you well. Are closed at the toe. Do not wear sandals. If you use a stepladder: Make sure that it is fully opened. Do not climb a closed stepladder. Make sure that both sides of the stepladder are locked into place. Ask someone to hold it for you, if possible. Clearly mark and make sure that you can see: Any grab bars or handrails. First and last steps. Where the edge of  each step is. Use tools that help you move around (mobility aids) if they are needed. These include: Canes. Walkers. Scooters. Crutches. Turn on the lights when you go into a dark area. Replace any light bulbs as soon as they burn out. Set up your furniture so you have a clear path. Avoid moving your furniture around. If any of your floors are uneven, fix them. If there are any pets around you, be aware of where they are. Review your medicines with your doctor. Some medicines can make you feel dizzy. This can increase your chance of falling. Ask your doctor what other things that you can do to help prevent falls. This information is not intended to replace advice given to you by your health care provider. Make sure you discuss any questions you have with your health care provider. Document Released: 09/23/2009 Document Revised: 05/04/2016 Document Reviewed: 01/01/2015 Elsevier Interactive Patient Education  2017 [employer].

## 2022-09-25 NOTE — Progress Notes (Signed)
Virtual Visit via Telephone Note  I connected with  Shelly Stein on 09/25/22 at  2:45 PM EDT by telephone and verified that I am speaking with the correct person using two identifiers.  Location: Patient: home Provider: [facility] Persons participating in the virtual visit: patient/Nurse Health Advisor   I discussed the limitations, risks, security and privacy concerns of performing an evaluation and management service by telephone and the availability of in person appointments. The patient expressed understanding and agreed to proceed.  Interactive audio and video telecommunications were attempted between this nurse and patient, however failed, due to patient having technical difficulties OR patient did not have access to video capability.  We continued and completed visit with audio only.  Some vital signs may be absent or patient reported.   Dionisio David, LPN  Subjective:   Shelly Stein is a 76 y.o. female who presents for Medicare Annual (Subsequent) preventive examination.  Review of Systems     Cardiac Risk Factors include: advanced age (>81mn, >>86women);hypertension     Objective:    Today's Vitals   09/25/22 1444  PainSc: 5    There is no height or weight on file to calculate BMI.     09/25/2022    2:55 PM 11/30/2021   10:15 AM 09/20/2021    2:08 PM 08/06/2018   12:53 PM 07/25/2018    1:47 PM 07/02/2018    3:14 PM 06/25/2018    4:47 PM  Advanced Directives  Does Patient Have a Medical Advance Directive? No Yes Yes Yes Yes Yes   Type of ACorporate treasurerof ADearyLiving will HThom[place]Living will H[place]Living will HSo-HiLiving will HTh[place]Living will HMintoLiving will  Does patient want to make changes to medical advance directive?  No - Patient declined       Copy of [place] Chart?  No - copy requested  No - copy requested      Would patient like information on creating a medical advance directive? No - Patient declined          Current Medications (verified) Outpatient Encounter Medications as of 09/25/2022  Medication Sig   allopurinol (ZYLOPRIM) 100 MG tablet 2 (two) times daily.    Ascorbic Acid (VITAMIN C) 1000 MG tablet Take 1,000 mg by mouth daily.   calcium carbonate (OSCAL) 1500 (600 Ca) MG TABS tablet Take by mouth.   Cholecalciferol (VITAMIN D3) 50 MCG (2000 UT) capsule    dicyclomine (BENTYL) 10 MG capsule Take 10 mg by mouth 3 (three) times daily.   ferrous sulfate 325 (65 FE) MG tablet Take 325 mg by mouth daily with breakfast.   Fish Oil OIL Take 1,000 mg by mouth.   folic acid (FOLVITE) 1 MG tablet    Homeopathic Products (ARNICARE) GEL See admin instructions.   lisinopril (ZESTRIL) 2.5 MG tablet    lisinopril (ZESTRIL) 5 MG tablet TAKE 1/2 TABLET EVERY DAY   Magnesium 200 MG TABS Take by mouth daily.   methotrexate (RHEUMATREX) 2.5 MG tablet Take by mouth.   Multiple Vitamins-Minerals (CENTRUM SILVER PO) Take by mouth daily.   rosuvastatin (CRESTOR) 5 MG tablet Take 1 tablet (5 mg total) by mouth daily.   SHINGRIX injection INJECT 0.5 ML INTO MUSCLE ONCE FOR 1 DOSE   [DISCONTINUED] lisinopril (ZESTRIL) 5 MG tablet Take by mouth.   acarbose (PRECOSE) 25 MG tablet Take by mouth. (  Patient not taking: Reported on 09/25/2022)   Meclizine HCl 25 MG CHEW Chew 1 tablet by mouth as needed. (Patient not taking: Reported on 09/25/2022)   oxyCODONE-acetaminophen (PERCOCET) 5-325 MG tablet Take 1 tablet 3 times a day by oral route as needed. (Patient not taking: Reported on 09/25/2022)   RABEprazole (ACIPHEX) 20 MG tablet Take by mouth. (Patient not taking: Reported on 09/25/2022)   [DISCONTINUED] allopurinol (ZYLOPRIM) 100 MG tablet Take by mouth.   [DISCONTINUED] Cholecalciferol 50 MCG (2000 UT) CAPS Take by mouth.   [DISCONTINUED] folic acid (FOLVITE) 1 MG tablet Take 2 tablets  by mouth daily.   [DISCONTINUED] Methotrexate 2.5 MG/ML SOLN    [DISCONTINUED] MULTIPLE VITAMIN-FOLIC ACID PO 1 tablet (Patient not taking: Reported on 06/19/2022)   [DISCONTINUED] ondansetron (ZOFRAN) 4 MG tablet take 1 tablet by mouth every 8 hours if needed for nausea and vomiting (Patient not taking: Reported on 09/25/2022)   [DISCONTINUED] ondansetron (ZOFRAN) 8 MG tablet take 1 tablet by mouth every 8 hours if needed for nausea   [DISCONTINUED] OVER THE COUNTER MEDICATION Calcium and vit d3   [DISCONTINUED] pantoprazole (PROTONIX) 40 MG tablet Take 1 tablet by mouth every morning. (Patient not taking: Reported on 09/25/2022)   [DISCONTINUED] Teriparatide, Recombinant, (FORTEO) 600 MCG/2.4ML SOPN Inject by subcutaneous route. (Patient not taking: Reported on 09/25/2022)   No facility-administered encounter medications on file as of 09/25/2022.    Allergies (verified) Patient has no known allergies.   History: Past Medical History:  Diagnosis Date   Abnormal MRI    Back pain    Benign cyst of breast, left    Diverticulosis    mild   Gastritis    Headache    Hypertension    Microscopic hematuria 07/28/2020   Worked up by [facility] urology.  Cystoscope negative.  August 2021.  CAT scan 2018 negative   Nausea    Nutcracker esophagus    Osteoporosis    Sciatica of left side    Tinnitus    Tubular adenoma 09/20/2021   Colon 2022 next one 2025, [facility], Dr Virgina Jock   Unsteady gait    Past Surgical History:  Procedure Laterality Date   ABDOMINAL HYSTERECTOMY     BREAST BIOPSY Left 06/02/2016   ESOPHAGEAL MANOMETRY     FLEXIBLE SIGMOIDOSCOPY     FOOT SURGERY     SHOULDER SURGERY     Family History  Problem Relation Age of Onset   Breast cancer Mother    Heart disease Mother    Aneurysm Father    Colon cancer Neg Hx    Stomach cancer Neg Hx    Rectal cancer Neg Hx    Esophageal cancer Neg Hx    Liver cancer Neg Hx    Social History   Socioeconomic History    Marital status: Widowed    Spouse name: Not on file   Number of children: 1   Years of education: some college   Highest education level: Not on file  Occupational History   Occupation: part time  [place] Use   Smoking status: Never   Smokeless tobacco: Never  Vaping Use   Vaping Use: Never used  Substance and Sexual Activity   Alcohol use: No   Drug use: No   Sexual activity: Not on file  Other Topics Concern   Not on file  Social History Narrative   Lives at home alone. Widowed at age 89.    Right-handed.   Occasional caffeine.  Social Determinants of Health   Financial Resource Strain: Low Risk  (09/25/2022)   Overall Financial Resource Strain (CARDIA)    Difficulty of Paying Living Expenses: Not hard at all  Food Insecurity: No Food Insecurity (09/25/2022)   Hunger Vital Sign    Worried About Running Out of Food in the Last Year: Never true    Ran Out of Food in the Last Year: Never true  Transportation Needs: No Transportation Needs (09/25/2022)   PRAPARE - Hydrologist (Medical): No    Lack of Transportation (Non-Medical): No  Physical Activity: Insufficiently Active (09/25/2022)   Exercise Vital Sign    Days of Exercise per Week: 3 days    Minutes of Exercise per Session: 30 min  Stress: No Stress Concern Present (09/25/2022)   [place]    Feeling of Stress : Not at all  Social Connections: Moderately Isolated (09/25/2022)   Social Connection and Isolation Panel [NHANES]    Frequency of Communication with Friends and Family: More than three times a week    Frequency of Social Gatherings with Friends and Family: Twice a week    Attends Religious Services: More than 4 times per year    Active Member of [employer] or Organizations: No    Attends Archivist Meetings: Never    Marital Status: Widowed    Tobacco Counseling Counseling given:  Not Answered   Clinical Intake:  Pre-visit preparation completed: Yes  Pain : 0-10 Pain Score: 5  Pain Location: Neck     Nutritional Risks: None Diabetes: No  How often do you need to have someone help you when you read instructions, pamphlets, or other written materials from your doctor or pharmacy?: 1 - Never  Diabetic?no  Interpreter Needed?: No  Information entered by :: Kirke Shaggy, LPN   Activities of Daily Living    09/25/2022    2:55 PM  In your present state of health, do you have any difficulty performing the following activities:  Hearing? 0  Vision? 0  Difficulty concentrating or making decisions? 0  Walking or climbing stairs? 0  Dressing or bathing? 0  Doing errands, shopping? 0  Preparing Food and eating ? N  Using the Toilet? N  In the past six months, have you accidently leaked urine? N  Do you have problems with loss of bowel control? N  Managing your Medications? N  Managing your Finances? N  Housekeeping or managing your Housekeeping? N    Patient Care Team: Kathyrn Drown, MD as PCP - General (Family Medicine) Otis Brace, MD as Consulting Physician (Gastroenterology)  Indicate any recent Medical Services you may have received from other than [facility] providers in the past year (date may be approximate).     Assessment:   This is a routine wellness examination for [place].  Hearing/Vision screen Hearing Screening - Comments:: No aids Vision Screening - Comments:: Wears glasses- [facility]  Dietary issues and exercise activities discussed: Current Exercise Habits: Home exercise routine, Type of exercise: walking, Time (Minutes): 30, Frequency (Times/Week): 3, Weekly Exercise (Minutes/Week): 90, Intensity: Mild   Goals Addressed             This Visit's Progress    DIET - EAT MORE FRUITS AND VEGETABLES         Depression Screen    09/25/2022    2:52 PM 01/20/2022    8:37 AM 11/30/2021   10:10 AM 09/20/2021  1:58  PM 08/09/2021   10:13 AM 06/09/2021   11:04 AM 05/10/2021   10:48 AM  PHQ 2/9 Scores  PHQ - 2 Score 0 0 0 0 0 0 0  PHQ- 9 Score 0          Fall Risk    09/25/2022    2:55 PM 06/19/2022    8:10 AM 01/20/2022    8:37 AM 11/30/2021   10:10 AM 09/20/2021    2:08 PM  [place] in the past year? 0 0 0 0 0  Number falls in past yr: 0 0 0  0  Injury with Fall? 0 0 0  0  Risk for fall due to : No Fall Risks No Fall Risks No Fall Risks  Impaired vision  Follow up Falls prevention discussed;Falls evaluation completed Falls evaluation completed Falls evaluation completed  Falls prevention discussed    FALL RISK PREVENTION PERTAINING TO THE HOME:  Any stairs in or around the home? Yes  If so, are there any without handrails? No  Home free of loose throw rugs in walkways, pet beds, electrical cords, etc? Yes  Adequate lighting in your home to reduce risk of falls? Yes   ASSISTIVE DEVICES UTILIZED TO PREVENT FALLS:  Life alert? No  Use of a cane, walker or w/c? No  Grab bars in the bathroom? No  Shower chair or bench in shower? Yes  Elevated toilet seat or a handicapped toilet? Yes    Cognitive Function:        09/25/2022    2:56 PM 09/20/2021    2:12 PM  6CIT Screen  What Year? 0 points 0 points  What month? 0 points 0 points  What time? 0 points 0 points  Count back from 20 0 points 0 points  Months in reverse 0 points 0 points  Repeat phrase 0 points 0 points  Total Score 0 points 0 points    Immunizations Immunization History  Administered Date(s) Administered   Moderna Sars-Covid-2 Vaccination 09/15/2020, 09/28/2020, 10/13/2020, 01/11/2021   Pneumococcal Conjugate-13 10/23/2014   Pneumococcal Polysaccharide-23 08/12/2011   Zoster Recombinat (Shingrix) 08/14/2019, 11/04/2019    TDAP status: Due, Education has been provided regarding the importance of this vaccine. Advised may receive this vaccine at local pharmacy or Health Dept. Aware to provide a copy of  the vaccination record if obtained from local pharmacy or Health Dept. Verbalized acceptance and understanding.  Flu Vaccine status: Due, Education has been provided regarding the importance of this vaccine. Advised may receive this vaccine at local pharmacy or Health Dept. Aware to provide a copy of the vaccination record if obtained from local pharmacy or Health Dept. Verbalized acceptance and understanding.  Pneumococcal vaccine status: Up to date  Covid-19 vaccine status: Completed vaccines  Qualifies for Shingles Vaccine? Yes   Zostavax completed No   Shingrix Completed?: Yes  Screening Tests Health Maintenance  Topic Date Due   Hepatitis C Screening  Never done   TETANUS/TDAP  Never done   COVID-19 Vaccine (4 - Moderna risk series) 03/08/2021   COLONOSCOPY (Pts 45-66yr Insurance coverage will need to be confirmed)  02/11/2024   Pneumonia Vaccine 76 Years old  Completed   DEXA SCAN  Completed   Zoster Vaccines- Shingrix  Completed   HPV VACCINES  Aged Out   INFLUENZA VACCINE  Discontinued    Health Maintenance  Health Maintenance Due  Topic Date Due   Hepatitis C Screening  Never done   TETANUS/TDAP  Never done   COVID-19 Vaccine (4 - Moderna risk series) 03/08/2021    Colorectal cancer screening: Type of screening: Colonoscopy. Completed 02/10/21. Repeat every 3 years  Mammogram status: Completed 06/12/22. Repeat every year  Bone Density status: Completed 12/19/21. Results reflect: Bone density results: OSTEOPOROSIS. Repeat every 2 years.  Lung Cancer Screening: (Low Dose CT Chest recommended if Age 54-80 years, 30 pack-year currently smoking OR have quit w/in 15years.) does not qualify.   Additional Screening:  Hepatitis C Screening: does qualify; Completed no  Vision Screening: Recommended annual ophthalmology exams for early detection of glaucoma and other disorders of the eye. Is the patient up to date with their annual eye exam?  Yes  Who is the provider or  what is the name of the office in which the patient attends annual eye exams? [place] If pt is not established with a provider, would they like to be referred to a provider to establish care? No .   Dental Screening: Recommended annual dental exams for proper oral hygiene  Community Resource Referral / Chronic Care Management: CRR required this visit?  No   CCM required this visit?  No      Plan:     I have personally reviewed and noted the following in the patient's chart:   Medical and social history Use of alcohol, tobacco or illicit drugs  Current medications and supplements including opioid prescriptions. Patient is not currently taking opioid prescriptions. Functional ability and status Nutritional status Physical activity Advanced directives List of other physicians Hospitalizations, surgeries, and ER visits in previous 12 months Vitals Screenings to include cognitive, depression, and falls Referrals and appointments  In addition, I have reviewed and discussed with patient certain preventive protocols, quality metrics, and best practice recommendations. A written personalized care plan for preventive services as well as general preventive health recommendations were provided to patient.     Dionisio David, LPN   09/47/0962   Nurse Notes: none

## 2022-09-27 ENCOUNTER — Encounter: Payer: Self-pay | Admitting: Physical Medicine and Rehabilitation

## 2022-09-27 ENCOUNTER — Ambulatory Visit: Payer: Medicare HMO | Admitting: Physical Medicine and Rehabilitation

## 2022-09-27 VITALS — BP 101/66 | HR 70

## 2022-09-27 DIAGNOSIS — M542 Cervicalgia: Secondary | ICD-10-CM | POA: Diagnosis not present

## 2022-09-27 DIAGNOSIS — M7918 Myalgia, other site: Secondary | ICD-10-CM

## 2022-09-27 DIAGNOSIS — M47812 Spondylosis without myelopathy or radiculopathy, cervical region: Secondary | ICD-10-CM | POA: Diagnosis not present

## 2022-09-27 MED ORDER — DIAZEPAM 5 MG PO TABS: ORAL_TABLET | ORAL | 0 refills | Status: DC

## 2022-09-27 NOTE — Progress Notes (Signed)
Shelly Stein - 76 y.o. female MRN [id number]  Date of birth: 15-Jun-1946  Office Visit Note: Visit Date: 09/27/2022 PCP: Kathyrn Drown, MD Referred by: Kathyrn Drown, MD  Subjective: Chief Complaint  Patient presents with   Neck - Pain   HPI: Shelly Stein is a 76 y.o. female who comes in today per the request of Dr. Joni Fears for evaluation chronic, worsening and severe bilateral neck pain, intermittent radiation of pain to shoulders, down arms and up to head. Bilateral neck pain is most severe issue. Pain ongoing for several years and is exacerbated by movement and activity, she describes pain as sore, aching and tight, currently rates 8 out of 10. Patient also reports popping and cracking sounds when she moves her neck. Patient reports severe pain to neck when turning to drive. Some relief of pain with home exercise regimen, ice/heat, rest and use of medications. Good relief of pain with Tylenol and topical pain creams. Patient did attend formal physical therapy in 2019 at [facility] where she did undergo dry needling. Reports good short term relief with these treatments. Cervical MRI imaging from 2022 exhibits multi level facet arthrosis and multi level foraminal stenosis most severe on the left at C5-C6 and C6-C7. No high grade spinal canal stenosis noted. Previous consult with Dr. Melina Schools in 2022 at [facility], per his notes surgical intervention not recommended at that time, she was sent to Dr. Suella Broad to discuss possible cervical injections, however she declined intervention. Patient states pain is constant and is negatively impacting her daily life, states she is ready to try injections. Patient denies focal weakness, numbness and tingling. Patient denies recent trauma or falls.    Review of Systems  Musculoskeletal:  Positive for myalgias and neck pain.  Neurological:  Negative for tingling, sensory change, focal weakness and weakness.   All other systems reviewed and are negative.  Otherwise per HPI.  Assessment & Plan: Visit Diagnoses:    ICD-10-CM   1. Cervicalgia  M54.2 Ambulatory referral to Physical Medicine Rehab    2. Facet hypertrophy of cervical region  M47.812 Ambulatory referral to Physical Medicine Rehab    3. Myofascial pain syndrome  M79.18 Ambulatory referral to Physical Medicine Rehab       Plan: Findings:  Chronic, worsening and severe bilateral neck pain, intermittent radiation of pain to shoulders, down arms and up to head. Bilateral neck pain is most severe pain. Patient continues to have severe pain despite good conservative therapies such as formal physical therapy, dry needling, home exercise regimen, and medications. Patients clinical presentation and exam are consistent with facet mediated pain. Severe pain with side to side rotation of neck upon exam today. I also feel there is a myofascial component contributing to her pain as she does have multiple palpable trigger points to bilateral levator scapulae muscles. Next step is to perform diagnostic and hopefully therapeutic bilateral C4-C5 and C5-C6 facet joint/medial branch blocks under fluoroscopic guidance. If good relief of pain with diagnostic facet blocks we did discuss possibility of longer sustained relief of pain with radiofrequency ablation. Patient did voice anxiety related to injection procedure, I did prescribe pre-procedure Valium for her to take on day of procedure. We would consider cervical epidural steroid injection in the future if her symptoms seem more radicular in nature. No red flag symptoms noted upon exam today.     Meds & Orders:  Meds ordered this encounter  Medications   diazepam (VALIUM)  5 MG tablet    Sig: Take one tablet by mouth with food one hour prior to procedure. May repeat 30 minutes prior if needed.    Dispense:  2 tablet    Refill:  0    Orders Placed This Encounter  Procedures   Ambulatory referral to  Physical Medicine Rehab    Follow-up: Return for Bilateral C4-C5 and C5-C6 facet joint/medial branch blocks.   Procedures: No procedures performed      Clinical History: EXAM: MRI CERVICAL SPINE WITHOUT CONTRAST   TECHNIQUE: Multiplanar, multisequence MR imaging of the cervical spine was performed. No intravenous contrast was administered.   COMPARISON:  CT neck 12/08/2020.   FINDINGS: Motion limited examination.  Within this limitation:   Alignment: Mildly exaggerated cervical lordosis. No substantial sagittal subluxation.   Vertebrae: Vertebral body heights are maintained. No specific evidence of acute fracture or discitis/osteomyelitis.   Cord: Normal cord signal.   Posterior Fossa, vertebral arteries, paraspinal tissues: Visualized vertebral artery flow voids are maintained. Visualized posterior fossa is unremarkable on limited assessment.   Disc levels:   C2-C3: Small posterior disc osteophyte complex without significant canal or foraminal stenosis.   C3-C4: Small posterior disc osteophyte complex and left greater than right facet and uncovertebral hypertrophy. Resulting mild left foraminal stenosis without significant canal stenosis.   C4-C5: Small posterior disc osteophyte complex partially effaces ventral CSF. Bilateral facet and uncovertebral hypertrophy. Resulting moderate left and mild right foraminal stenosis. No significant canal stenosis   C5-C6: Motion limited evaluation with posterior disc osteophyte complex and left greater than right facet and uncovertebral hypertrophy. Likely severe left and mild right foraminal stenosis. Mild canal stenosis.   C6-C7: Motion limited evaluation with posterior disc osteophyte complex and left greater than right facet and uncovertebral hypertrophy. Likely severe left and mild right foraminal stenosis. Mild canal stenosis.   C7-T1: No significant disc protrusion, foraminal stenosis, or canal stenosis.   No  significant canal stenosis in the visualized upper thoracic spine. Multilevel incidental thoracic perineural root sleeve cysts.   IMPRESSION: 1. Motion limited evaluation with suspected severe foraminal stenosis on the left at C5-C6 and C6-C7, moderate foraminal stenosis on the left at C4-C5, and mild foraminal stenosis on the left at C3-C4 and the right at C4-C5, C5-C6, and C6-C7. 2. Mild canal stenosis at C5-C6 and C6-C7.     Electronically Signed   By: Margaretha Sheffield MD   On: 04/27/2021 10:24   She reports that she has never smoked. She has never used smokeless tobacco. No results for input(s): "HGBA1C", "LABURIC" in the last 8760 hours.  Objective:  VS:  HT:    WT:   BMI:     BP:101/66  HR:70bpm  TEMP: ( )  RESP:  Physical Exam Vitals and nursing note reviewed.  HENT:     Head: Normocephalic and atraumatic.     Right Ear: External ear normal.     Left Ear: External ear normal.     Nose: Nose normal.     Mouth/Throat:     Mouth: Mucous membranes are moist.  Eyes:     Extraocular Movements: Extraocular movements intact.  Cardiovascular:     Rate and Rhythm: Normal rate.     Pulses: Normal pulses.  Pulmonary:     Effort: Pulmonary effort is normal.  Abdominal:     General: Abdomen is flat. There is no distension.  Musculoskeletal:        General: Tenderness present.     Cervical back: Tenderness  present.     Comments: Discomfort noted with flexion, extension and side-to-side rotation. Patient has good strength in the upper extremities including 5 out of 5 strength in wrist extension, long finger flexion and APB.  There is no atrophy of the hands intrinsically. Multiple palpable trigger points noted to bilateral levator scapulae muscles. Sensation intact bilaterally. Negative Hoffman's sign.   Skin:    General: Skin is warm and dry.     Capillary Refill: Capillary refill takes less than 2 seconds.  Neurological:     General: No focal deficit present.     Mental  Status: She is alert and oriented to person, place, and time.  Psychiatric:        Mood and Affect: Mood normal.     Ortho Exam  Imaging: No results found.  Past Medical/Family/Surgical/Social History: Medications & Allergies reviewed per EMR, new medications updated. 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   Tubular adenoma 09/20/2021   Rheumatoid arthritis, involving unspecified site, unspecified whether rheumatoid factor present ([place]) 03/15/2021   Other hyperlipidemia 09/13/2020   Microscopic hematuria 07/28/2020   Pain in left shoulder 10/29/2019   Nauseous 07/10/2019   Essential hypertension 07/23/2018   Abdominal pain, left lower quadrant 03/07/2017   Nausea and vomiting in adult 03/07/2017   Rectal bleeding 03/07/2017   Sciatica of left side 12/12/2013   Osteoporosis 06/28/2011   Past Medical History:  Diagnosis Date   Abnormal MRI    Back pain    Benign cyst of breast, left    Diverticulosis    mild   Gastritis    Headache    Hypertension    Microscopic hematuria 07/28/2020   Worked up by [facility] urology.  Cystoscope negative.  August 2021.  CAT scan 2018 negative   Nausea    Nutcracker esophagus    Osteoporosis    Sciatica of left side    Tinnitus    Tubular adenoma 09/20/2021   Colon 2022 next one 2025, [facility], Dr Virgina Jock   Unsteady gait    Family History  Problem Relation Age of Onset   Breast cancer Mother    Heart disease Mother    Aneurysm Father    Colon cancer Neg Hx    Stomach cancer Neg Hx    Rectal cancer Neg Hx    Esophageal cancer Neg Hx    Liver cancer Neg Hx    Past Surgical History:  Procedure Laterality Date   ABDOMINAL HYSTERECTOMY     BREAST BIOPSY Left 06/02/2016   ESOPHAGEAL MANOMETRY     FLEXIBLE SIGMOIDOSCOPY     FOOT SURGERY     SHOULDER SURGERY     Social History   Occupational History   Occupation: part  time  [employer] Use   Smoking status: Never   Smokeless tobacco: Never  Vaping Use   Vaping Use: Never used  Substance and Sexual Activity   Alcohol use: No   Drug use: No   Sexual activity: Not on file

## 2022-09-27 NOTE — Progress Notes (Signed)
Numeric Pain Rating Scale and Functional Assessment Average Pain 8   In the last MONTH (on 0-10 scale) has pain interfered with the following?  1. General activity like being  able to carry out your everyday physical activities such as walking, climbing stairs, carrying groceries, or moving a chair?  Rating(8)   +Driver, -BT, -Dye Allergies.  Moving head makes pain worse. Radiates into head and can cause headaches. Extra strength Tylenol

## 2022-10-05 ENCOUNTER — Ambulatory Visit: Payer: Self-pay

## 2022-10-05 ENCOUNTER — Encounter: Payer: Medicare HMO | Admitting: Physical Medicine and Rehabilitation

## 2022-10-05 ENCOUNTER — Ambulatory Visit: Payer: Medicare HMO | Admitting: Physical Medicine and Rehabilitation

## 2022-10-05 VITALS — BP 110/63 | HR 67

## 2022-10-05 DIAGNOSIS — M47812 Spondylosis without myelopathy or radiculopathy, cervical region: Secondary | ICD-10-CM | POA: Diagnosis not present

## 2022-10-05 MED ORDER — BUPIVACAINE HCL 0.25 % IJ SOLN
2.0000 mL | Freq: Once | INTRAMUSCULAR | Status: AC
  Administered 2022-10-05: 2 mL

## 2022-10-05 NOTE — Patient Instructions (Signed)

## 2022-10-05 NOTE — Progress Notes (Signed)
Numeric Pain Rating Scale and Functional Assessment Average Pain 7   In the last MONTH (on 0-10 scale) has pain interfered with the following?  1. General activity like being  able to carry out your everyday physical activities such as walking, climbing stairs, carrying groceries, or moving a chair?  Rating( varies from 7-9 )   +Driver, -BT, -Dye Allergies.  Neck pain with no reason of why it hurts. Causes headaches. Takes Extra Strength Tylenol with no relief

## 2022-10-09 DIAGNOSIS — K219 Gastro-esophageal reflux disease without esophagitis: Secondary | ICD-10-CM | POA: Diagnosis not present

## 2022-10-09 DIAGNOSIS — M542 Cervicalgia: Secondary | ICD-10-CM | POA: Diagnosis not present

## 2022-10-09 DIAGNOSIS — M1990 Unspecified osteoarthritis, unspecified site: Secondary | ICD-10-CM | POA: Diagnosis not present

## 2022-10-09 DIAGNOSIS — M79643 Pain in unspecified hand: Secondary | ICD-10-CM | POA: Diagnosis not present

## 2022-10-09 DIAGNOSIS — Z79899 Other long term (current) drug therapy: Secondary | ICD-10-CM | POA: Diagnosis not present

## 2022-10-09 DIAGNOSIS — M109 Gout, unspecified: Secondary | ICD-10-CM | POA: Diagnosis not present

## 2022-10-09 DIAGNOSIS — M0579 Rheumatoid arthritis with rheumatoid factor of multiple sites without organ or systems involvement: Secondary | ICD-10-CM | POA: Diagnosis not present

## 2022-10-09 DIAGNOSIS — E790 Hyperuricemia without signs of inflammatory arthritis and tophaceous disease: Secondary | ICD-10-CM | POA: Diagnosis not present

## 2022-10-09 DIAGNOSIS — M810 Age-related osteoporosis without current pathological fracture: Secondary | ICD-10-CM | POA: Diagnosis not present

## 2022-10-10 DIAGNOSIS — L905 Scar conditions and fibrosis of skin: Secondary | ICD-10-CM | POA: Diagnosis not present

## 2022-10-10 DIAGNOSIS — L84 Corns and callosities: Secondary | ICD-10-CM | POA: Diagnosis not present

## 2022-10-10 DIAGNOSIS — B353 Tinea pedis: Secondary | ICD-10-CM | POA: Diagnosis not present

## 2022-10-10 DIAGNOSIS — L820 Inflamed seborrheic keratosis: Secondary | ICD-10-CM | POA: Diagnosis not present

## 2022-10-10 DIAGNOSIS — D1801 Hemangioma of skin and subcutaneous tissue: Secondary | ICD-10-CM | POA: Diagnosis not present

## 2022-10-10 DIAGNOSIS — D692 Other nonthrombocytopenic purpura: Secondary | ICD-10-CM | POA: Diagnosis not present

## 2022-10-11 NOTE — Progress Notes (Signed)
Shelly Stein - 76 y.o. female MRN [id number]  Date of birth: May 17, 1946  Office Visit Note: Visit Date: 10/05/2022 PCP: Kathyrn Drown, MD Referred by: Kathyrn Drown, MD  Subjective: Chief Complaint  Patient presents with   Neck - Pain   HPI:  Shelly Stein is a 76 y.o. female who comes in today at the request of Barnet Pall, FNP for planned Bilateral  C4-5 and C5-6 Cervical facet/medial branch block with fluoroscopic guidance.  The patient has failed conservative care including home exercise, medications, time and activity modification.  This injection will be diagnostic and hopefully therapeutic.  Please see requesting physician notes for further details and justification.  Exam has shown concordant pain with facet joint loading.   ROS Otherwise per HPI.  Assessment & Plan: Visit Diagnoses:    ICD-10-CM   1. Cervical spondylosis without myelopathy  M47.812 XR C-ARM NO REPORT    Facet Injection    bupivacaine (MARCAINE) 0.25 % (with pres) injection 2 mL      Plan: No additional findings.   Meds & Orders:  Meds ordered this encounter  Medications   bupivacaine (MARCAINE) 0.25 % (with pres) injection 2 mL    Orders Placed This Encounter  Procedures   Facet Injection   XR C-ARM NO REPORT    Follow-up: Return for Review Pain Diary.   Procedures: No procedures performed  Diagnostic Cervical Facet Joint Nerve Block with Fluoroscopic Guidance  Patient: Shelly Stein      Date of Birth: 04/29/1946 MRN: [id number] PCP: Kathyrn Drown, MD      Visit Date: 10/05/2022   Universal Protocol:    Date/Time: 11/01/235:45 AM  Consent Given By: the patient  Position: PRONE  Additional Comments: Vital signs were monitored before and after the procedure. Patient was prepped and draped in the usual sterile fashion. The correct patient, procedure, and site was verified.   Injection Procedure Details:   Procedure diagnoses: Cervical spondylosis without  myelopathy [M47.812]   Meds Administered:  Meds ordered this encounter  Medications   bupivacaine (MARCAINE) 0.25 % (with pres) injection 2 mL     Laterality: Bilateral  Location/Site:  C4-5 C5-6  Needle size: 25 G  Needle type: Spinal  Needle Placement: Articular Pillar  Findings:  -Contrast Used: 0.5 mL iohexol 180 mg iodine/mL   -Comments: Excellent flow of contrast across the articular pillars without intravascular flow  Procedure Details: The fluoroscope beam was positioned to square off the endplates of the desired vertebral level to achieve a true AP position. The beam was then moved in a small "counter" oblique to the contralateral side with a small amount of caudal tilt to achieve a trajectory alignment with the desired nerves.  For each target described below the skin was anesthetized with 1 ml of 1% Lidocaine without epinephrine.   To block the facet joint nerves from C3 through C7, the lateral masses of these respective levels were localized under fluoroscopic visualization.  A spinal needle was inserted down to the "waist" at the above mentioned cervical levels.  The  needle was then "walked off" until it rested just lateral to the trough of the lateral mass of the medial branch nerve, which innervates the cervical facet joint.  After contact with periosteum and negative aspirate for blood and CSF, correct placement without intravascular or epidural spread was confirmed by Bi-planar images and  injecting 0.5 ml. of Omnipaque-240.  A spot radiograph was obtained of this image.  Next,  a 0.5 ml. volume of 1% Lidocaine without Epinephrine was then injected.  Prior to the procedure, the patient was given a Pain Diary which was completed for baseline measurements.  After the procedure, the patient rated their pain every 30 minutes and will continue rating at this frequency for a total of 5 hours.  The patient has been asked to complete the Diary and return to [place] by mail, fax  or hand delivered as soon as possible.   Additional Comments:  No complications occurred Dressing: Band-Aid    Post-procedure details: Patient was observed during the procedure. Post-procedure instructions were reviewed.  Patient left the clinic in stable condition.       Clinical History: EXAM: MRI CERVICAL SPINE WITHOUT CONTRAST   TECHNIQUE: Multiplanar, multisequence MR imaging of the cervical spine was performed. No intravenous contrast was administered.   COMPARISON:  CT neck 12/08/2020.   FINDINGS: Motion limited examination.  Within this limitation:   Alignment: Mildly exaggerated cervical lordosis. No substantial sagittal subluxation.   Vertebrae: Vertebral body heights are maintained. No specific evidence of acute fracture or discitis/osteomyelitis.   Cord: Normal cord signal.   Posterior Fossa, vertebral arteries, paraspinal tissues: Visualized vertebral artery flow voids are maintained. Visualized posterior fossa is unremarkable on limited assessment.   Disc levels:   C2-C3: Small posterior disc osteophyte complex without significant canal or foraminal stenosis.   C3-C4: Small posterior disc osteophyte complex and left greater than right facet and uncovertebral hypertrophy. Resulting mild left foraminal stenosis without significant canal stenosis.   C4-C5: Small posterior disc osteophyte complex partially effaces ventral CSF. Bilateral facet and uncovertebral hypertrophy. Resulting moderate left and mild right foraminal stenosis. No significant canal stenosis   C5-C6: Motion limited evaluation with posterior disc osteophyte complex and left greater than right facet and uncovertebral hypertrophy. Likely severe left and mild right foraminal stenosis. Mild canal stenosis.   C6-C7: Motion limited evaluation with posterior disc osteophyte complex and left greater than right facet and uncovertebral hypertrophy. Likely severe left and mild right  foraminal stenosis. Mild canal stenosis.   C7-T1: No significant disc protrusion, foraminal stenosis, or canal stenosis.   No significant canal stenosis in the visualized upper thoracic spine. Multilevel incidental thoracic perineural root sleeve cysts.   IMPRESSION: 1. Motion limited evaluation with suspected severe foraminal stenosis on the left at C5-C6 and C6-C7, moderate foraminal stenosis on the left at C4-C5, and mild foraminal stenosis on the left at C3-C4 and the right at C4-C5, C5-C6, and C6-C7. 2. Mild canal stenosis at C5-C6 and C6-C7.     Electronically Signed   By: Margaretha Sheffield MD   On: 04/27/2021 10:24     Objective:  VS:  HT:    WT:   BMI:     BP:110/63  HR:67bpm  TEMP: ( )  RESP:  Physical Exam Vitals and nursing note reviewed.  Constitutional:      General: She is not in acute distress.    Appearance: Normal appearance. She is not ill-appearing.  HENT:     Head: Normocephalic and atraumatic.     Right Ear: External ear normal.     Left Ear: External ear normal.  Eyes:     Extraocular Movements: Extraocular movements intact.  Cardiovascular:     Rate and Rhythm: Normal rate.     Pulses: Normal pulses.  Musculoskeletal:     Cervical back: Tenderness present. No rigidity.     Right lower leg: No edema.     Left lower  leg: No edema.     Comments: Patient has good strength in the upper extremities including 5 out of 5 strength in wrist extension long finger flexion and APB.  There is no atrophy of the hands intrinsically.  There is a negative Hoffmann's test.   Lymphadenopathy:     Cervical: No cervical adenopathy.  Skin:    Findings: No erythema, lesion or rash.  Neurological:     General: No focal deficit present.     Mental Status: She is alert and oriented to person, place, and time.     Sensory: No sensory deficit.     Motor: No weakness or abnormal muscle tone.     Coordination: Coordination normal.  Psychiatric:        Mood and  Affect: Mood normal.        Behavior: Behavior normal.      Imaging: No results found.

## 2022-10-11 NOTE — Procedures (Signed)
Diagnostic Cervical Facet Joint Nerve Block with Fluoroscopic Guidance  Patient: Shelly Stein      Date of Birth: 1946-10-18 MRN: [id number] PCP: Kathyrn Drown, MD      Visit Date: 10/05/2022   Universal Protocol:    Date/Time: 11/01/235:45 AM  Consent Given By: the patient  Position: PRONE  Additional Comments: Vital signs were monitored before and after the procedure. Patient was prepped and draped in the usual sterile fashion. The correct patient, procedure, and site was verified.   Injection Procedure Details:   Procedure diagnoses: Cervical spondylosis without myelopathy [M47.812]   Meds Administered:  Meds ordered this encounter  Medications   bupivacaine (MARCAINE) 0.25 % (with pres) injection 2 mL     Laterality: Bilateral  Location/Site:  C4-5 C5-6  Needle size: 25 G  Needle type: Spinal  Needle Placement: Articular Pillar  Findings:  -Contrast Used: 0.5 mL iohexol 180 mg iodine/mL   -Comments: Excellent flow of contrast across the articular pillars without intravascular flow  Procedure Details: The fluoroscope beam was positioned to square off the endplates of the desired vertebral level to achieve a true AP position. The beam was then moved in a small "counter" oblique to the contralateral side with a small amount of caudal tilt to achieve a trajectory alignment with the desired nerves.  For each target described below the skin was anesthetized with 1 ml of 1% Lidocaine without epinephrine.   To block the facet joint nerves from C3 through C7, the lateral masses of these respective levels were localized under fluoroscopic visualization.  A spinal needle was inserted down to the "waist" at the above mentioned cervical levels.  The  needle was then "walked off" until it rested just lateral to the trough of the lateral mass of the medial branch nerve, which innervates the cervical facet joint.  After contact with periosteum and negative aspirate for  blood and CSF, correct placement without intravascular or epidural spread was confirmed by Bi-planar images and  injecting 0.5 ml. of Omnipaque-240.  A spot radiograph was obtained of this image.  Next, a 0.5 ml. volume of 1% Lidocaine without Epinephrine was then injected.  Prior to the procedure, the patient was given a Pain Diary which was completed for baseline measurements.  After the procedure, the patient rated their pain every 30 minutes and will continue rating at this frequency for a total of 5 hours.  The patient has been asked to complete the Diary and return to [place] by mail, fax or hand delivered as soon as possible.   Additional Comments:  No complications occurred Dressing: Band-Aid    Post-procedure details: Patient was observed during the procedure. Post-procedure instructions were reviewed.  Patient left the clinic in stable condition.

## 2022-10-18 DIAGNOSIS — M2042 Other hammer toe(s) (acquired), left foot: Secondary | ICD-10-CM | POA: Diagnosis not present

## 2022-10-18 DIAGNOSIS — M79671 Pain in right foot: Secondary | ICD-10-CM | POA: Diagnosis not present

## 2022-10-18 DIAGNOSIS — M79672 Pain in left foot: Secondary | ICD-10-CM | POA: Diagnosis not present

## 2022-10-18 DIAGNOSIS — M2041 Other hammer toe(s) (acquired), right foot: Secondary | ICD-10-CM | POA: Diagnosis not present

## 2022-10-18 DIAGNOSIS — L97522 Non-pressure chronic ulcer of other part of left foot with fat layer exposed: Secondary | ICD-10-CM | POA: Diagnosis not present

## 2022-11-01 DIAGNOSIS — M71572 Other bursitis, not elsewhere classified, left ankle and foot: Secondary | ICD-10-CM | POA: Diagnosis not present

## 2022-12-19 DIAGNOSIS — M2012 Hallux valgus (acquired), left foot: Secondary | ICD-10-CM | POA: Diagnosis not present

## 2022-12-19 DIAGNOSIS — M2011 Hallux valgus (acquired), right foot: Secondary | ICD-10-CM | POA: Diagnosis not present

## 2022-12-19 DIAGNOSIS — M2041 Other hammer toe(s) (acquired), right foot: Secondary | ICD-10-CM | POA: Diagnosis not present

## 2022-12-19 DIAGNOSIS — M2042 Other hammer toe(s) (acquired), left foot: Secondary | ICD-10-CM | POA: Diagnosis not present

## 2022-12-19 DIAGNOSIS — L851 Acquired keratosis [keratoderma] palmaris et plantaris: Secondary | ICD-10-CM | POA: Diagnosis not present

## 2022-12-20 ENCOUNTER — Ambulatory Visit: Payer: Medicare HMO | Admitting: Family Medicine

## 2023-01-03 ENCOUNTER — Encounter: Payer: Self-pay | Admitting: Family Medicine

## 2023-01-03 ENCOUNTER — Ambulatory Visit (INDEPENDENT_AMBULATORY_CARE_PROVIDER_SITE_OTHER): Payer: Medicare HMO | Admitting: Family Medicine

## 2023-01-03 VITALS — BP 108/78 | HR 68 | Temp 97.2°F | Wt 103.0 lb

## 2023-01-03 DIAGNOSIS — M810 Age-related osteoporosis without current pathological fracture: Secondary | ICD-10-CM | POA: Diagnosis not present

## 2023-01-03 DIAGNOSIS — E7849 Other hyperlipidemia: Secondary | ICD-10-CM | POA: Diagnosis not present

## 2023-01-03 DIAGNOSIS — I10 Essential (primary) hypertension: Secondary | ICD-10-CM | POA: Diagnosis not present

## 2023-01-03 MED ORDER — ROSUVASTATIN CALCIUM 5 MG PO TABS: 5.0000 mg | ORAL_TABLET | Freq: Every day | ORAL | 2 refills | Status: DC

## 2023-01-03 MED ORDER — LISINOPRIL 5 MG PO TABS: ORAL_TABLET | ORAL | 2 refills | Status: DC

## 2023-01-03 NOTE — Progress Notes (Signed)
   Subjective:    Patient ID: Shelly Stein, female    DOB: 07/11/1946, 77 y.o.   MRN: [id number]  HPI Very nice patient She has had severe troubles with loose stools over the past year She was being seen by gastroenterology in [place] they referred her to gastroenterology at [facility] she states the specialist at [facility] is no longer there She relates ongoing troubles with loose stools although she takes Imodium it seems to help She denies any major issues other than the loose stools She tries to eat healthy Not depressed Labs and tests from [facility] were reviewed  Hypertension and gastroenterology referral needed previous doctor retired - daily diarrhea   Review of Systems     Objective:   Physical Exam General-in no acute distress Eyes-no discharge Lungs-respiratory rate normal, CTA CV-no murmurs,RRR Extremities skin warm dry no edema Neuro grossly normal Behavior normal, alert        Assessment & Plan:  1. Other hyperlipidemia Check lab work continue meds - Lipid Panel - Hepatic Function Panel  2. Essential hypertension Blood pressure decent control healthy diet - Lipid Panel - Hepatic Function Panel  3. 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, she went through numerous test at [facility] Patient is frustrated-I certainly understand her frustration but at the same time it is quite possible that there is no clear cure for her issue she is currently using dicyclomine and Imodium which seems reasonable  We will be helping her set back up with gastroenterology she would like to have a local option She also expresses desire to get this further looked at by a specialist at [facility] or [facility]  Her weight tends to be  on the low side but it is hard for her to absorb the calories she takes and because of her frequent loose stools

## 2023-01-04 ENCOUNTER — Telehealth: Payer: Self-pay | Admitting: Family Medicine

## 2023-01-04 DIAGNOSIS — R195 Other fecal abnormalities: Secondary | ICD-10-CM

## 2023-01-04 DIAGNOSIS — R1030 Lower abdominal pain, unspecified: Secondary | ICD-10-CM

## 2023-01-04 LAB — HEPATIC FUNCTION PANEL
ALT: 13 IU/L (ref 0–32)
AST: 30 IU/L (ref 0–40)
Albumin: 4.7 g/dL (ref 3.8–4.8)
Alkaline Phosphatase: 42 IU/L — ABNORMAL LOW (ref 44–121)
Bilirubin Total: 0.6 mg/dL (ref 0.0–1.2)
Bilirubin, Direct: 0.17 mg/dL (ref 0.00–0.40)
Total Protein: 6.6 g/dL (ref 6.0–8.5)

## 2023-01-04 LAB — LIPID PANEL
Chol/HDL Ratio: 1.8 ratio (ref 0.0–4.4)
Cholesterol, Total: 160 mg/dL (ref 100–199)
HDL: 87 mg/dL (ref >39)
LDL Chol Calc (NIH): 60 mg/dL (ref 0–99)
Triglycerides: 68 mg/dL (ref 0–149)
VLDL Cholesterol Cal: 13 mg/dL (ref 5–40)

## 2023-01-04 NOTE — Telephone Encounter (Signed)
Nurses-please go ahead with referral to gastroenterology at [facility] for loose stools and lower abdominal discomfort

## 2023-01-05 NOTE — Telephone Encounter (Signed)
Referral placed- Mychart message sent to patient

## 2023-01-09 DIAGNOSIS — M109 Gout, unspecified: Secondary | ICD-10-CM | POA: Diagnosis not present

## 2023-01-09 DIAGNOSIS — M79643 Pain in unspecified hand: Secondary | ICD-10-CM | POA: Diagnosis not present

## 2023-01-09 DIAGNOSIS — Z79899 Other long term (current) drug therapy: Secondary | ICD-10-CM | POA: Diagnosis not present

## 2023-01-09 DIAGNOSIS — M0579 Rheumatoid arthritis with rheumatoid factor of multiple sites without organ or systems involvement: Secondary | ICD-10-CM | POA: Diagnosis not present

## 2023-01-09 DIAGNOSIS — M1990 Unspecified osteoarthritis, unspecified site: Secondary | ICD-10-CM | POA: Diagnosis not present

## 2023-01-09 DIAGNOSIS — K219 Gastro-esophageal reflux disease without esophagitis: Secondary | ICD-10-CM | POA: Diagnosis not present

## 2023-01-09 DIAGNOSIS — M542 Cervicalgia: Secondary | ICD-10-CM | POA: Diagnosis not present

## 2023-01-09 DIAGNOSIS — M810 Age-related osteoporosis without current pathological fracture: Secondary | ICD-10-CM | POA: Diagnosis not present

## 2023-01-21 ENCOUNTER — Telehealth: Payer: Self-pay | Admitting: Family Medicine

## 2023-01-21 DIAGNOSIS — R1030 Lower abdominal pain, unspecified: Secondary | ICD-10-CM

## 2023-01-21 DIAGNOSIS — R112 Nausea with vomiting, unspecified: Secondary | ICD-10-CM

## 2023-01-21 DIAGNOSIS — R195 Other fecal abnormalities: Secondary | ICD-10-CM

## 2023-01-21 NOTE — Telephone Encounter (Signed)
Nurses Please notify patient that we did try to refer her to [facility] gastroenterology regarding her issue.  They sent [place] a message back that at this point in time they will not be accepting the referral.  They want her to see local gastroenterology either here in town or in [place] then be referred onward if necessary to tertiary care center but currently [facility] is refusing the referral so therefore I would recommend consultation with gastroenterology locally  Please find out from patient if she is willing to do so if so we could set her up with Dr. Abbey Chatters or Dr. Jenetta Downer.  Then when the local specialist sees her if they need to consult one of the [facility] centers they will have better luck being a specialist

## 2023-01-22 NOTE — Telephone Encounter (Signed)
Pt contacted and verbalized understanding. Pt states she tried to get set up with GI in [place] but they denied her also. Referral placed to Dr.Carver/Castaneda.

## 2023-01-25 ENCOUNTER — Encounter (INDEPENDENT_AMBULATORY_CARE_PROVIDER_SITE_OTHER): Payer: Self-pay | Admitting: *Deleted

## 2023-02-26 ENCOUNTER — Ambulatory Visit (INDEPENDENT_AMBULATORY_CARE_PROVIDER_SITE_OTHER): Payer: Medicare HMO | Admitting: Gastroenterology

## 2023-04-30 DIAGNOSIS — H2513 Age-related nuclear cataract, bilateral: Secondary | ICD-10-CM | POA: Diagnosis not present

## 2023-04-30 DIAGNOSIS — Z0100 Encounter for examination of eyes and vision without abnormal findings: Secondary | ICD-10-CM | POA: Diagnosis not present

## 2023-05-01 DIAGNOSIS — M79671 Pain in right foot: Secondary | ICD-10-CM | POA: Diagnosis not present

## 2023-05-01 DIAGNOSIS — M2012 Hallux valgus (acquired), left foot: Secondary | ICD-10-CM | POA: Diagnosis not present

## 2023-05-01 DIAGNOSIS — M2011 Hallux valgus (acquired), right foot: Secondary | ICD-10-CM | POA: Diagnosis not present

## 2023-05-01 DIAGNOSIS — M79672 Pain in left foot: Secondary | ICD-10-CM | POA: Diagnosis not present

## 2023-05-03 ENCOUNTER — Encounter: Payer: Self-pay | Admitting: Obstetrics and Gynecology

## 2023-05-03 DIAGNOSIS — Z1231 Encounter for screening mammogram for malignant neoplasm of breast: Secondary | ICD-10-CM

## 2023-05-08 ENCOUNTER — Encounter (INDEPENDENT_AMBULATORY_CARE_PROVIDER_SITE_OTHER): Payer: Self-pay | Admitting: Gastroenterology

## 2023-05-08 ENCOUNTER — Ambulatory Visit (INDEPENDENT_AMBULATORY_CARE_PROVIDER_SITE_OTHER): Payer: Medicare HMO | Admitting: Gastroenterology

## 2023-05-08 VITALS — BP 100/69 | HR 83 | Temp 97.1°F | Ht 64.0 in | Wt 102.3 lb

## 2023-05-08 DIAGNOSIS — K911 Postgastric surgery syndromes: Secondary | ICD-10-CM

## 2023-05-08 DIAGNOSIS — K224 Dyskinesia of esophagus: Secondary | ICD-10-CM | POA: Diagnosis not present

## 2023-05-08 DIAGNOSIS — R110 Nausea: Secondary | ICD-10-CM

## 2023-05-08 MED ORDER — PROCHLORPERAZINE MALEATE 5 MG PO TABS: 5.0000 mg | ORAL_TABLET | Freq: Three times a day (TID) | ORAL | 0 refills | Status: DC | PRN

## 2023-05-08 NOTE — Patient Instructions (Signed)
I am placing a referral to nutrition I have sent compazine 5mg  tablets to be taken up to every 8 hours for nausea You can continue to use imodium as needed I will discuss further recommendations with Dr. Levon Hedger upon his return  Follow up 3 months

## 2023-05-08 NOTE — Progress Notes (Addendum)
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 Patient takes otc nauzene. No ppi's.   HPI:   Shelly Stein is a 77 y.o. female with past medical history of HTN, Nutcracker esophagus, tinnitus, tubular adenoma, RA.   Patient presenting today as a new patient for abdominal pain and diarrhea.  Previously followed with [facility] then GI at [facility], had EGD in 2023, as well as GES, and SIBO breath testing. SIBO testing negative, GES as below (dumping syndrome).   GI pathogen panel, C diff and Pancreatic fecal elastase were all negative  She had been started on Precose and referred to nutrition   Gastrin and chromagranin, TSH, as well as cortisol all WNL in feb 2023 Celiac panel negative  Present: Patient reports nausea, weight loss, diarrhea and gurgling in her stomach for the past few years. She states that she was seeing another GI previously but he retired. She saw GI provider previously before her last one and was told there was nothing they could do for her. She is taking multiple imodium per day for her loose stool/diarrhea. She can take up to 6 imodium per day. Notes nausea is constant but she does not vomit. She takes [employer] which does help for a few minutes. She was told she had dumping syndrome but states there were no real recommendations. She did take dicyclomine which did not help. She reports some weight loss, previously was 118 lbs about 2 years ago, she had dropped to 98 pounds at one point, per chart review, appears to be around 101-106 for the past few years. She does Boost plus to help with her weight loss. Appetite is  better some days than others. She notes she tries to eat higher calorie foods. she has never met with a dietician. She has atleast 1-2 episodes of diarrhea per day, sometimes more. Stools can be loose to watery. She notes that she has had intermittent toilet tissue hematochezia and darker stools off and on for the past few years, notably is on iron pills since she was started on methotrexate for her RA. Has generalized abdominal pain, not worsened or alleviated by eating.   Was started on Precose 25mg  BID by previous GI but states she could not tolerate this.   She has ongoing dysphagia even with a swallow of water with her pills in the mornings. She has some chest pain at times when she has more dysphagia, feels this Is more related to her esophageal issues. She notes 75% of the time she has issues with dysphagia when eating or drinking.   No previous intestinal/GI tract/weight loss surgeries per patient.   NSAID use: none  Social hx: no tobacco or alcohol  Fam hx: no CRC or liver disease  Mesenteric duplex 01/2022:  CT A/P with contrast: 06/2021 No abnormality to explain the clinical presentation. No visible change since the study last year. HIDA: EF 79% [place] Abd Limited: 01/2022  No acute sonographic findings in the imaged abdomen.  2.  Multiple benign-appearing simple and minimally complex renal cysts.  GES: 01/2023 Severely rapid gastric emptying with only 2% residual in the stomach at  60 minutes. No further imaging was performed due to lack of activity within the stomach.  Last Colonoscopy:02/2021 diverticulosis, multiple polyps? Last Endoscopy:01/2022 [facility] mild reactive gastropathy-no h  pylori  Recommendations:    Past Medical History:  Diagnosis Date   Abnormal MRI    Back pain    Benign cyst of breast, left    Diverticulosis    mild   Gastritis    Headache    Hypertension    Microscopic hematuria 07/28/2020   Worked up by [facility] urology.  Cystoscope negative.  August 2021.   CAT scan 2018 negative   Nausea    Nutcracker esophagus    Osteoporosis    Sciatica of left side    Tinnitus    Tubular adenoma 09/20/2021   Colon 2022 next one 2025, [facility], Dr Waynetta Pean   Unsteady gait     Past Surgical History:  Procedure Laterality Date   ABDOMINAL HYSTERECTOMY     BREAST BIOPSY Left 06/02/2016   ESOPHAGEAL MANOMETRY     FLEXIBLE SIGMOIDOSCOPY     FOOT SURGERY     SHOULDER SURGERY      Current Outpatient Medications  Medication Sig Dispense Refill   allopurinol (ZYLOPRIM) 100 MG tablet 2 (two) times daily.      calcium carbonate (OSCAL) 1500 (600 Ca) MG TABS tablet Take 1,200 mg by mouth daily with breakfast.     Dextrose-Fructose-Sod Citrate (NAUZENE PO) Take by mouth. 2 chews Prn nausea     Fish Oil OIL Take 1,000 mg by mouth.     Magnesium 200 MG TABS Take 250 mg by mouth daily.     Multiple Vitamins-Minerals (CENTRUM SILVER PO) Take by mouth daily.     Multiple Vitamins-Minerals (ICAPS AREDS 2 PO) Take by mouth. 1 am and 1 qhs     dicyclomine (BENTYL) 10 MG capsule Take 10 mg by mouth 3 (three) times daily. (Patient not taking: Reported on 05/08/2023)     folic acid (FOLVITE) 1 MG tablet      lisinopril (ZESTRIL) 5 MG tablet TAKE 1/2 TABLET EVERY DAY 45 tablet 2   methotrexate (RHEUMATREX) 2.5 MG tablet Take by mouth.     rosuvastatin (CRESTOR) 5 MG tablet Take 1 tablet (5 mg total) by mouth daily. 90 tablet 2   No current facility-administered medications for this visit.    Allergies as of 05/08/2023   (No Known Allergies)    Family History  Problem Relation Age of Onset   Breast cancer Mother    Heart disease Mother    Aneurysm Father    Colon cancer Neg Hx    Stomach cancer Neg Hx    Rectal cancer Neg Hx    Esophageal cancer Neg Hx    Liver cancer Neg Hx     Social History   Socioeconomic History   Marital status: Widowed    Spouse name: Not on file   Number of children: 1   Years of education: some college   Highest  education level: Not on file  Occupational History   Occupation: part time  [employer]  Tobacco Use   Smoking status: Never   Smokeless tobacco: Never  Vaping Use   Vaping Use: Never used  Substance and Sexual Activity   Alcohol use: No   Drug use: No   Sexual activity: Not on file  Other Topics Concern   Not on file  Social History Narrative   Lives at home alone. Widowed at age 58.  Right-handed.   Occasional caffeine.   Social Determinants of Health   Financial Resource Strain: Low Risk  (09/25/2022)   Overall Financial Resource Strain (CARDIA)    Difficulty of Paying Living Expenses: Not hard at all  Food Insecurity: No Food Insecurity (09/25/2022)   Hunger Vital Sign    Worried About Running Out of Food in the Last Year: Never true    Ran Out of Food in the Last Year: Never true  Transportation Needs: No Transportation Needs (09/25/2022)   PRAPARE - Administrator, Civil Service (Medical): No    Lack of Transportation (Non-Medical): No  Physical Activity: Insufficiently Active (09/25/2022)   Exercise Vital Sign    Days of Exercise per Week: 3 days    Minutes of Exercise per Session: 30 min  Stress: No Stress Concern Present (09/25/2022)   [employer] of Occupational Health - Occupational Stress Questionnaire    Feeling of Stress : Not at all  Social Connections: Moderately Isolated (09/25/2022)   Social Connection and Isolation Panel [NHANES]    Frequency of Communication with Friends and Family: More than three times a week    Frequency of Social Gatherings with Friends and Family: Twice a week    Attends Religious Services: More than 4 times per year    Active Member of [employer] or Organizations: No    Attends Banker Meetings: Never    Marital Status: Widowed    Review of systems General: negative for malaise, night sweats, fever, chills, weight loss Neck: Negative for lumps, goiter, pain and significant neck  swelling Resp: Negative for cough, wheezing, dyspnea at rest CV: Negative for chest pain, leg swelling, palpitations, orthopnea GI: denies melena, hematochezia,vomiting, constipation, odyonophagia, early satiety or unintentional weight loss. +diarrhea +nausea +dysphagia +abdominal pain MSK: Negative for joint pain or swelling, back pain, and muscle pain. Derm: Negative for itching or rash Psych: Denies depression, anxiety, memory loss, confusion. No homicidal or suicidal ideation.  Heme: Negative for prolonged bleeding, bruising easily, and swollen nodes. Endocrine: Negative for cold or heat intolerance, polyuria, polydipsia and goiter. Neuro: negative for tremor, gait imbalance, syncope and seizures. The remainder of the review of systems is noncontributory.  Physical Exam: BP 100/69 (BP Location: Left Arm, Patient Position: Sitting, Cuff Size: Large)   Pulse 83   Temp (!) 97.1 F (36.2 C) (Temporal)   Ht 5\' 4"  (1.626 m)   Wt 102 lb 4.8 oz (46.4 kg)   BMI 17.56 kg/m  General:   Alert and oriented. No distress noted. Pleasant and cooperative.  Head:  Normocephalic and atraumatic. Eyes:  Conjuctiva clear without scleral icterus. Mouth:  Oral mucosa pink and moist. Good dentition. No lesions. Heart: Normal rate and rhythm, s1 and s2 heart sounds present.  Lungs: Clear lung sounds in all lobes. Respirations equal and unlabored. Abdomen:  +BS, soft, non-tender and non-distended. No rebound or guarding. No HSM or masses noted. Derm: No palmar erythema or jaundice Msk:  Symmetrical without gross deformities. Normal posture. Extremities:  Without edema. Neurologic:  Alert and  oriented x4 Psych:  Alert and cooperative. Normal mood and affect.  Invalid input(s): "6 MONTHS"   ASSESSMENT: Shelly Stein is a 77 y.o. female presenting today as a new patient for abdominal pain, nausea, diarrhea and dysphagia.  Abdominal pain/nausea/diarrhea: GES done previously with evidence of dumping  syndrome, interestingly enough she denies any history of GI/weight loss surgeries. Has had ongoing symptoms for the past 2 or so years with  multiple testing done, as outlined above. Weight appears to be stable at this time. She is doing protein shakes to supplement her nutrition. She has never met with a dietician which I think may be beneficial for her. She has not tried any prescription strength anti emetics. Can trial low dose compazine. She did not tolerate precose in the past, was also tried on bentyl which did not help.   Nutcracker esophagus/dysphagia: patient reports she was told she had nutcracker esophagus in the past, previously following at [facility] but her doctor (Dr. Alycia Rossetti) retired. Does not appear she has seen a GI motility specialist. As she continues to have issues with dysphagia, she may benefit from tertiary center evaluation by esophageal motility specialist.  Given the dynamic of patient's previous workup and complex etiology, will discuss further recommendations with Dr. Levon Hedger regarding other treatment options/ possible referral to tertiary esophageal motility clinic. I discussed this with the patient and will be in touch with her regarding further recommendations.    PLAN:  Refer to nutrition   2.  Compazine 5mg  Q8H PRN  3. Discuss further recommendations with Dr. Levon Hedger  4. Consider referral to motility specialist at tertiary center 5. Continue with boost nutrition shakes  All questions were answered, patient verbalized understanding and is in agreement with plan as outlined above.   Follow Up: 3 months   Shelly Stein L. 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  Also presenting some esophageal complaints.   Tried a carbose in the past without significant improvement.  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  She will benefit from evaluation at [facility] by motility experts.  Unfortunately Dr. Alycia Rossetti has retired, will send a new referral to their motility group.  Katrinka Blazing, MD Gastroenterology and Hepatology [facility] Gastroenterology

## 2023-05-09 DIAGNOSIS — M79643 Pain in unspecified hand: Secondary | ICD-10-CM | POA: Diagnosis not present

## 2023-05-09 DIAGNOSIS — M1990 Unspecified osteoarthritis, unspecified site: Secondary | ICD-10-CM | POA: Diagnosis not present

## 2023-05-09 DIAGNOSIS — K219 Gastro-esophageal reflux disease without esophagitis: Secondary | ICD-10-CM | POA: Diagnosis not present

## 2023-05-09 DIAGNOSIS — M810 Age-related osteoporosis without current pathological fracture: Secondary | ICD-10-CM | POA: Diagnosis not present

## 2023-05-09 DIAGNOSIS — M542 Cervicalgia: Secondary | ICD-10-CM | POA: Diagnosis not present

## 2023-05-09 DIAGNOSIS — M109 Gout, unspecified: Secondary | ICD-10-CM | POA: Diagnosis not present

## 2023-05-09 DIAGNOSIS — M0579 Rheumatoid arthritis with rheumatoid factor of multiple sites without organ or systems involvement: Secondary | ICD-10-CM | POA: Diagnosis not present

## 2023-05-09 DIAGNOSIS — Z79899 Other long term (current) drug therapy: Secondary | ICD-10-CM | POA: Diagnosis not present

## 2023-05-09 DIAGNOSIS — M549 Dorsalgia, unspecified: Secondary | ICD-10-CM | POA: Diagnosis not present

## 2023-05-11 ENCOUNTER — Other Ambulatory Visit: Payer: Self-pay | Admitting: Family Medicine

## 2023-06-07 DIAGNOSIS — Z79899 Other long term (current) drug therapy: Secondary | ICD-10-CM | POA: Diagnosis not present

## 2023-06-21 DIAGNOSIS — H25813 Combined forms of age-related cataract, bilateral: Secondary | ICD-10-CM | POA: Diagnosis not present

## 2023-07-04 ENCOUNTER — Ambulatory Visit: Payer: Medicare HMO | Admitting: Family Medicine

## 2023-07-04 VITALS — BP 118/70 | HR 74 | Wt 99.8 lb

## 2023-07-04 DIAGNOSIS — Z79899 Other long term (current) drug therapy: Secondary | ICD-10-CM

## 2023-07-04 DIAGNOSIS — R899 Unspecified abnormal finding in specimens from other organs, systems and tissues: Secondary | ICD-10-CM | POA: Diagnosis not present

## 2023-07-04 DIAGNOSIS — I10 Essential (primary) hypertension: Secondary | ICD-10-CM | POA: Diagnosis not present

## 2023-07-04 DIAGNOSIS — R110 Nausea: Secondary | ICD-10-CM | POA: Diagnosis not present

## 2023-07-04 DIAGNOSIS — R195 Other fecal abnormalities: Secondary | ICD-10-CM

## 2023-07-04 DIAGNOSIS — R233 Spontaneous ecchymoses: Secondary | ICD-10-CM

## 2023-07-04 DIAGNOSIS — R634 Abnormal weight loss: Secondary | ICD-10-CM

## 2023-07-04 DIAGNOSIS — E7849 Other hyperlipidemia: Secondary | ICD-10-CM

## 2023-07-04 NOTE — Progress Notes (Signed)
   Subjective:    Patient ID: Shelly Stein, female    DOB: 05-26-1946, 77 y.o.   MRN: [id number]  HPI Patient arrives today for 6 month follow up.  Patient have a difficult time Trying to do the best candidate eating and staying healthy Having a hard time keeping her weight up Has underlying malabsorption issues with nausea and loose stools Weight loss  Essential hypertension  Other hyperlipidemia - Plan: Lipid panel  Loose stools  Chronic nausea  Hypercalcemia  Easy bruising - Plan: CBC with Differential, Protime-INR  High risk medication use - Plan: Hepatic Function Panel  Patient has been noticing easy bruising with her legs.  Review of Systems     Objective:   Physical Exam General-in no acute distress Eyes-no discharge Lungs-respiratory rate normal, CTA CV-no murmurs,RRR Extremities skin warm dry no edema Neuro grossly normal Behavior normal, alert        Assessment & Plan:  1. Weight loss Encourage patient to take in high caloric foods Follow-up with gastroenterology They may be getting her in a second opinion at a tertiary care center  2. Essential hypertension Blood pressure decent control currently.  3. Other hyperlipidemia Check lipid profile healthy diet regular activity - Lipid panel  4. Loose stools More than likely a malabsorption issue we will connect with gastroenterology-they told her they would have follow-up recommendations after they discussed case with Dr. Levon Hedger they have not heard anything since then  5. Chronic nausea Medication as needed small meals frequently  6. Hypercalcemia Elevated calcium noted.  May need further workup if persists  7. Easy bruising Will go ahead and check lab work await results check lab work - CBC with Differential - Protime-INR  8. High risk medication use Check lab work await results - Hepatic Function Panel

## 2023-07-04 NOTE — Addendum Note (Signed)
Addended by: Lilyan Punt A on: 07/04/2023 08:41 PM   Modules accepted: Orders

## 2023-07-05 NOTE — Progress Notes (Signed)
I spoke to [place] at [facility], she couldn't tell me why patient had not been scheduled, she was going to route it the front desk to make sure it had been reviewed then they will escalate the referral. I asked them to call me and let me know when patient is scheduled.

## 2023-07-06 ENCOUNTER — Encounter: Payer: Self-pay | Admitting: Family Medicine

## 2023-07-10 ENCOUNTER — Encounter: Payer: Medicare HMO | Attending: Family Medicine | Admitting: Nutrition

## 2023-07-10 ENCOUNTER — Encounter: Payer: Self-pay | Admitting: Nutrition

## 2023-07-10 VITALS — Ht 64.0 in | Wt 101.0 lb

## 2023-07-10 DIAGNOSIS — Z79899 Other long term (current) drug therapy: Secondary | ICD-10-CM | POA: Diagnosis not present

## 2023-07-10 DIAGNOSIS — K911 Postgastric surgery syndromes: Secondary | ICD-10-CM | POA: Insufficient documentation

## 2023-07-10 DIAGNOSIS — E7849 Other hyperlipidemia: Secondary | ICD-10-CM | POA: Diagnosis not present

## 2023-07-10 DIAGNOSIS — R636 Underweight: Secondary | ICD-10-CM | POA: Insufficient documentation

## 2023-07-10 DIAGNOSIS — R233 Spontaneous ecchymoses: Secondary | ICD-10-CM | POA: Diagnosis not present

## 2023-07-10 DIAGNOSIS — R634 Abnormal weight loss: Secondary | ICD-10-CM | POA: Insufficient documentation

## 2023-07-10 NOTE — Progress Notes (Signed)
Medical Nutrition Therapy  Appointment Start time:  0800  Appointment End time:  0900  Primary concerns today: Dumping syndrome, Underweight, weight loss   Referral diagnosis: K91.1,  Preferred learning style: No preference  Learning readiness: ready    NUTRITION ASSESSMENT  77 yr old wfemale referred for dumping syndrome, being underweight and not able to gain weight. Has been seen by  [facility],  and Dr. Salem Caster. Was seen by GI in  [place] and was told she is a 'mystery patient" and they have been unable to identify causes of her issues of chronic loose stools/diarrhea and chronic  nausea and unexplained weight loss. GI work up has been done.     Dumping syndrome in question. However, she notes she only has loose stools in am and not after each meal. Hasn't noticed changes with what he eats and it's effect on stools.    She notes she has a loose stool every morning but doesn't have them typically throughout the day. Usually takes some immodium before she leaves the house to prevent unexpected accidents.  On going weight loss. Has lost 3-5 lbs in the last few months. Appetite is good, but can't eat large volumes of food at one time. Eats 2-3 meals and snacks.  Drinks some Boost/Boost Plus 1-2 times per day in addition to eating her foods. Has chronic nausea feeling that has been on going for years. Is not on any PPI. History of Osteopenia/Osteoporosis. There aren't any medications that have made a difference in her nausea. Not a picky eater. Eats most things. She has been trying to add calories using whole milk, adding PB, cheese and gravies to foods sometimes.   She is underweight with BMI of 17. Use to weigh 120 lbs  in 2021. She lost about  10 lbs in 5 months from 11/21 to 4/22.  No chewing problems. Has occasional swallowing issues related to 'nutcracker' esophagus issues. Has a 'gurgling' sound in her stomach frequently.  Labs reviewed: ALK Phos low 42.  Lipid profile is good. Going  to get new labs done today ordered by Dr. Gerda Diss. BS has been elevated slightly in review of labs. May need to rule out pancreatic insuffiencey with fecal fat test or give a trail of pancreatic enzymes to see if symptoms improve for 1-3 months.  Going back to see GI in [place] in September for follow up again.  She is scheduled for cataract surgery in the  near future and is nervous about losing her eye sight.  Current food intake may be insuffient to meet her calorie needs at times. Some days she eats more than others. Living by herself, she cooks some and then gets [employer] small dinners at times. Goes out to eat some with family.   Anthropometrics  Wt Readings from Last 3 Encounters:  07/10/23 101 lb (45.8 kg)  07/04/23 99 lb 12.8 oz (45.3 kg)  05/08/23 102 lb 4.8 oz (46.4 kg)   Ht Readings from Last 3 Encounters:  07/10/23 5\' 4"  (1.626 m)  05/08/23 5\' 4"  (1.626 m)  01/20/22 5\' 3"  (1.6 m)   Body mass index is 17.34 kg/m. @BMIFA @ Facility age limit for growth %iles is 20 years. Facility age limit for growth %iles is 20 years.    Clinical Medical Hx: See chart Medications: see chart Labs:     Latest Ref Rng & Units 01/03/2023   11:11 AM 06/19/2022    8:57 AM 02/08/2022   11:23 AM  CMP  Glucose 70 -  99 mg/dL  811  914   BUN 8 - 27 mg/dL  20  15   Creatinine 7.82 - 1.00 mg/dL  9.56  2.13   Sodium 086 - 144 mmol/L  142  144   Potassium 3.5 - 5.2 mmol/L  4.3  4.3   Chloride 96 - 106 mmol/L  104  105   CO2 20 - 29 mmol/L  23  27   Calcium 8.7 - 10.3 mg/dL  9.9  57.8   Total Protein 6.0 - 8.5 g/dL 6.6   6.3   Total Bilirubin 0.0 - 1.2 mg/dL 0.6   0.5   Alkaline Phos 44 - 121 IU/L 42   49   AST 0 - 40 IU/L 30   26   ALT 0 - 32 IU/L 13   13    Lipid Panel     Component Value Date/Time   CHOL 160 01/03/2023 1111   TRIG 68 01/03/2023 1111   HDL 87 01/03/2023 1111   CHOLHDL 1.8 01/03/2023 1111   CHOLHDL 4.1 10/31/2014 1025   VLDL 15 10/31/2014 1025   LDLCALC 60 01/03/2023  1111   LABVLDL 13 01/03/2023 1111    Notable Signs/Symptoms: nausea, loose stools  Lifestyle & Dietary Hx LIves by herself.  Estimated daily fluid intake: 40 oz  Supplements: MVI, Calcium, Mg, Sleep: varies; sometimes difficult, Stress / self-care: no issues Current average weekly physical activity: ADL  24-Hr Dietary Recall First Meal: Boost or Boost Plus with medications/ or eggs, toast and bacon,  Snack: gingerale Second Meal: potato salad, fried okra, hush puppies, (small portions) water or sweet tea. Snack: nabs or apples with pb or  Third Meal: Boost PLus or bowl of cereal  Snack: boiled egg with mayonnaise Beverages: water  Estimated Energy Needs Calories: 1500 Carbohydrate: 170g Protein: 112g Fat: 42g   NUTRITION DIAGNOSIS  Oglesby-1.4 Altered GI function As related to dumping syndrome.  As evidenced by loose stools and weight loss..  NUTRITION INTERVENTION  Nutrition education (E-1) on the following topics:  High Calorie High Protein Diet Nutrient density of foods   Handouts Provided Include  High Calorie High Protein Diet   Learning Style & Readiness for Change Teaching method utilized: Visual & Auditory  Demonstrated degree of understanding via: Teach Back  Barriers to learning/adherence to lifestyle change: none  Goals Established by Pt Eat 3 meals and 3 small snacks daily consistently. Drink Boost Plus AFTER meals and not during or before. Drink any liquids after meal and not during or before Add calories to foods with pb, cheese, fats, sauces, and high calorie high fat dairy Don't skip meals and try to eat nutrient dense foods and not just empty calories when possible. Try to eat foods instead of making a meal from Boost but drink Boost at end of meal. Gain 1 lb by  next visit. Will ask Dr. Gerda Diss to do a fecal fat test to rule out malabsorption issues.   MONITORING & EVALUATION Dietary intake, weekly physical activity, and weight in 1  month.  Recommend to consider a fecal fat test and/or give a trial of pancreatic enzymes to see if it helps with digestion and weight gain for 1-3 months.  Next Steps  Patient is to work on eating high calorie high protein foods for desired weight gain.Marland Kitchen

## 2023-07-10 NOTE — Patient Instructions (Addendum)
Goals Established by Pt Eat 3 meals and 3 small snacks daily consistently. Drink Boost Plus AFTER meals and not during or before. Drink any liquids after meal and not during or before Add calories to foods with pb, cheese, fats, sauces, and high calorie high fat dairy Don't skip meals and try to eat nutrient dense foods and not just empty calories when possible. Try to eat foods instead of making a meal from Boost but drink Boost at end of meal. Gain 1 lb by  next visit. Will ask Dr. Gerda Diss to do a fecal fat test to rule out malabsorption issues.

## 2023-07-11 ENCOUNTER — Encounter: Payer: Self-pay | Admitting: *Deleted

## 2023-07-11 NOTE — Addendum Note (Signed)
Addended by: Margaretha Sheffield on: 07/11/2023 10:02 AM   Modules accepted: Orders

## 2023-07-12 DIAGNOSIS — D7589 Other specified diseases of blood and blood-forming organs: Secondary | ICD-10-CM | POA: Diagnosis not present

## 2023-07-12 DIAGNOSIS — R899 Unspecified abnormal finding in specimens from other organs, systems and tissues: Secondary | ICD-10-CM | POA: Diagnosis not present

## 2023-07-23 DIAGNOSIS — H25811 Combined forms of age-related cataract, right eye: Secondary | ICD-10-CM | POA: Diagnosis not present

## 2023-07-27 ENCOUNTER — Other Ambulatory Visit: Payer: Self-pay | Admitting: Family Medicine

## 2023-08-06 DIAGNOSIS — H52222 Regular astigmatism, left eye: Secondary | ICD-10-CM | POA: Diagnosis not present

## 2023-08-06 DIAGNOSIS — H25812 Combined forms of age-related cataract, left eye: Secondary | ICD-10-CM | POA: Diagnosis not present

## 2023-08-15 DIAGNOSIS — Z1231 Encounter for screening mammogram for malignant neoplasm of breast: Secondary | ICD-10-CM | POA: Diagnosis not present

## 2023-08-15 DIAGNOSIS — Z01419 Encounter for gynecological examination (general) (routine) without abnormal findings: Secondary | ICD-10-CM | POA: Diagnosis not present

## 2023-08-15 LAB — HM MAMMOGRAPHY

## 2023-08-22 DIAGNOSIS — M79643 Pain in unspecified hand: Secondary | ICD-10-CM | POA: Diagnosis not present

## 2023-08-22 DIAGNOSIS — M542 Cervicalgia: Secondary | ICD-10-CM | POA: Diagnosis not present

## 2023-08-22 DIAGNOSIS — M0579 Rheumatoid arthritis with rheumatoid factor of multiple sites without organ or systems involvement: Secondary | ICD-10-CM | POA: Diagnosis not present

## 2023-08-22 DIAGNOSIS — M25551 Pain in right hip: Secondary | ICD-10-CM | POA: Diagnosis not present

## 2023-08-22 DIAGNOSIS — Z0100 Encounter for examination of eyes and vision without abnormal findings: Secondary | ICD-10-CM | POA: Diagnosis not present

## 2023-08-22 DIAGNOSIS — M109 Gout, unspecified: Secondary | ICD-10-CM | POA: Diagnosis not present

## 2023-08-22 DIAGNOSIS — M810 Age-related osteoporosis without current pathological fracture: Secondary | ICD-10-CM | POA: Diagnosis not present

## 2023-08-22 DIAGNOSIS — M25552 Pain in left hip: Secondary | ICD-10-CM | POA: Diagnosis not present

## 2023-08-22 DIAGNOSIS — M549 Dorsalgia, unspecified: Secondary | ICD-10-CM | POA: Diagnosis not present

## 2023-08-22 DIAGNOSIS — M1990 Unspecified osteoarthritis, unspecified site: Secondary | ICD-10-CM | POA: Diagnosis not present

## 2023-08-22 DIAGNOSIS — Z961 Presence of intraocular lens: Secondary | ICD-10-CM | POA: Diagnosis not present

## 2023-08-22 DIAGNOSIS — K219 Gastro-esophageal reflux disease without esophagitis: Secondary | ICD-10-CM | POA: Diagnosis not present

## 2023-08-22 DIAGNOSIS — Z79899 Other long term (current) drug therapy: Secondary | ICD-10-CM | POA: Diagnosis not present

## 2023-08-28 ENCOUNTER — Encounter: Payer: Self-pay | Admitting: Nutrition

## 2023-08-28 ENCOUNTER — Encounter: Payer: Medicare HMO | Attending: Family Medicine | Admitting: Nutrition

## 2023-08-28 VITALS — Ht 62.5 in | Wt 102.0 lb

## 2023-08-28 DIAGNOSIS — R636 Underweight: Secondary | ICD-10-CM | POA: Insufficient documentation

## 2023-08-28 DIAGNOSIS — R634 Abnormal weight loss: Secondary | ICD-10-CM | POA: Insufficient documentation

## 2023-08-28 NOTE — Progress Notes (Signed)
Medical Nutrition Therapy  Appointment Start time:  1330 Appointment End time:  1400 Primary concerns today: Dumping syndrome, Underweight, weight loss   Referral diagnosis: K91.1,  Preferred learning style: No preference  Learning readiness: ready    NUTRITION ASSESSMENT  Had eye surgery. Can see a lot better.  Is on steroids for RA. Her appetite is better on the steriods. Gained  2 lbs since last visit.  Bowels are the same.Nothing she eats seems to make a difference in helping them be more consistent.  She notes she is drinking 3 Boost Plus a day and eating 3 meals and 3 small snacks trying to gain weight.  Past Medical History:  Diagnosis Date   Abnormal MRI    Back pain    Benign cyst of breast, left    Diverticulosis    mild   Gastritis    Headache    Hypertension    Microscopic hematuria 07/28/2020   Worked up by [facility] urology.  Cystoscope negative.  August 2021.  CAT scan 2018 negative   Nausea    Nutcracker esophagus    Osteoporosis    Sciatica of left side    Tinnitus    Tubular adenoma 09/20/2021   Colon 2022 next one 2025, Jarold Song, Dr Waynetta Pean   Unsteady gait     Anthropometrics  Wt Readings from Last 3 Encounters:  08/28/23 102 lb (46.3 kg)  07/10/23 101 lb (45.8 kg)  07/04/23 99 lb 12.8 oz (45.3 kg)   Ht Readings from Last 3 Encounters:  08/28/23 5' 2.5" (1.588 m)  07/10/23 5\' 4"  (1.626 m)  05/08/23 5\' 4"  (1.626 m)   Body mass index is 18.36 kg/m. @BMIFA @ Facility age limit for growth %iles is 20 years. Facility age limit for growth %iles is 20 years.   Clinical Medical Hx: See chart Medications:  Current Outpatient Medications on File Prior to Visit  Medication Sig Dispense Refill   allopurinol (ZYLOPRIM) 100 MG tablet 2 (two) times daily.      calcium carbonate (OSCAL) 1500 (600 Ca) MG TABS tablet Take 1,200 mg by mouth daily with breakfast.     Dextrose-Fructose-Sod Citrate (NAUZENE PO) Take by mouth. 2 chews Prn nausea      Fish Oil OIL Take 1,000 mg by mouth.     folic acid (FOLVITE) 1 MG tablet Take 2 mg by mouth daily.     lisinopril (ZESTRIL) 5 MG tablet TAKE 1/2 TABLET EVERY DAY 45 tablet 3   Magnesium 200 MG TABS Take 250 mg by mouth daily.     methotrexate (RHEUMATREX) 2.5 MG tablet Take by mouth.     Multiple Vitamins-Minerals (CENTRUM SILVER PO) Take by mouth daily.     Multiple Vitamins-Minerals (ICAPS AREDS 2 PO) Take by mouth. 1 am and 1 qhs     rosuvastatin (CRESTOR) 5 MG tablet TAKE 1 TABLET EVERY DAY 90 tablet 3   No current facility-administered medications on file prior to visit.    Labs:     Latest Ref Rng & Units 07/10/2023    9:24 AM 01/03/2023   11:11 AM 06/19/2022    8:57 AM  CMP  Glucose 70 - 99 mg/dL   250   BUN 8 - 27 mg/dL   20   Creatinine 5.39 - 1.00 mg/dL   7.67   Sodium 341 - 937 mmol/L   142   Potassium 3.5 - 5.2 mmol/L   4.3   Chloride 96 - 106 mmol/L   104  CO2 20 - 29 mmol/L   23   Calcium 8.7 - 10.3 mg/dL   9.9   Total Protein 6.0 - 8.5 g/dL 6.2  6.6    Total Bilirubin 0.0 - 1.2 mg/dL 0.5  0.6    Alkaline Phos 44 - 121 IU/L 55  42    AST 0 - 40 IU/L 32  30    ALT 0 - 32 IU/L 17  13     Lipid Panel     Component Value Date/Time   CHOL 143 07/10/2023 0924   TRIG 93 07/10/2023 0924   HDL 77 07/10/2023 0924   CHOLHDL 1.9 07/10/2023 0924   CHOLHDL 4.1 10/31/2014 1025   VLDL 15 10/31/2014 1025   LDLCALC 49 07/10/2023 0924   LABVLDL 17 07/10/2023 0924    Notable Signs/Symptoms: nausea, loose stools  Lifestyle & Dietary Hx LIves by herself.  Estimated daily fluid intake: 40 oz  Supplements: MVI, Calcium, Mg, Sleep: varies; sometimes difficult, Stress / self-care: no issues Current average weekly physical activity: ADL  24-Hr Dietary Recall First Meal: Egg with butter and shredded cheese, coffee Snack: Boost Plus Second Meal:  1 cup butterbeans, water  or gingerale , mini ice cream Snack: krispy cream donut,  Third Meal: Pretzels, chips or nut  mixture- udually has variety of vegetables. Snack:Boost Plus Beverages: water  Estimated Energy Needs Calories: 1500 Carbohydrate: 170g Protein: 112g Fat: 42g   NUTRITION DIAGNOSIS  -1.4 Altered GI function As related to dumping syndrome.  As evidenced by loose stools and weight loss..  NUTRITION INTERVENTION  Nutrition education (E-1) on the following topics:  High Calorie High Protein Diet Nutrient density of foods   Handouts Provided Include  High Calorie High Protein Diet   Learning Style & Readiness for Change Teaching method utilized: Visual & Auditory  Demonstrated degree of understanding via: Teach Back  Barriers to learning/adherence to lifestyle change: none  Goals Established by Pt Increase high calorie foods. Eat three meals and 3 healthy snacks Continue to increase dried beans, peas, lentils and dark leafy vegetables. Continue to consume 3 cans of Boost Plus daily for calorie needs. MONITORING & EVALUATION Dietary intake, weekly physical activity, and weight in 1 month. COnsider a fecal fat test if deemed appropriate.  Next Steps  Patient is to work on eating high calorie high protein foods for desired weight gain.Marland Kitchen

## 2023-08-28 NOTE — Patient Instructions (Signed)
Increase high calorie foods. Eat three meals and 3 healthy snacks Continue to increase dried beans, peas, lentils and dark leafy vegetables. Continue to consume 3 cans of Boost Plus daily for calorie needs.

## 2023-09-06 ENCOUNTER — Ambulatory Visit (INDEPENDENT_AMBULATORY_CARE_PROVIDER_SITE_OTHER): Payer: Medicare HMO | Admitting: Gastroenterology

## 2023-09-12 DIAGNOSIS — Z961 Presence of intraocular lens: Secondary | ICD-10-CM | POA: Diagnosis not present

## 2023-09-12 DIAGNOSIS — Z0100 Encounter for examination of eyes and vision without abnormal findings: Secondary | ICD-10-CM | POA: Diagnosis not present

## 2023-09-24 ENCOUNTER — Ambulatory Visit (INDEPENDENT_AMBULATORY_CARE_PROVIDER_SITE_OTHER): Payer: Medicare HMO | Admitting: Gastroenterology

## 2023-09-24 VITALS — BP 92/54 | HR 81 | Temp 98.1°F | Ht 62.0 in | Wt 99.8 lb

## 2023-09-24 DIAGNOSIS — K911 Postgastric surgery syndromes: Secondary | ICD-10-CM | POA: Diagnosis not present

## 2023-09-24 DIAGNOSIS — R197 Diarrhea, unspecified: Secondary | ICD-10-CM | POA: Insufficient documentation

## 2023-09-24 DIAGNOSIS — R110 Nausea: Secondary | ICD-10-CM

## 2023-09-24 DIAGNOSIS — K224 Dyskinesia of esophagus: Secondary | ICD-10-CM

## 2023-09-24 NOTE — Progress Notes (Signed)
Referring Provider: Babs Sciara, MD Primary Care Physician:  Babs Sciara, MD Primary GI Physician: Dr. Levon Hedger   Chief Complaint  Patient presents with   Follow-up    Pt says nausea hasn't gotten any better.    HPI:   Shelly Stein is a 77 y.o. female with past medical history of  HTN, Nutcracker esophagus, tinnitus, tubular adenoma, RA, dumping syndrome   Patient presenting today for follow up of dumping syndrome, dysphagia, abdominal pain   History:Previously followed with [facility] then GI at [facility], had EGD in 2023, as well as GES, and SIBO breath testing. SIBO testing negative, GES as below (dumping syndrome). GI pathogen panel, C diff and Pancreatic fecal elastase were all negative. She had been started on Precose and referred to nutrition. Gastrin and chromagranin, TSH, as well as cortisol all WNL in feb 2023 Celiac panel negative  Last seen here in May 2024, at that time nausea, weight loss, diarrhea. For the past few years. Taking imodium multiple times per day. No vomiting. Taking nauzene otc which helps some. Took dicyclomine without improvement. Trying to do boost shakes to help with weight loss. Never seen dietician. Having dysphagia even with swallowing water.   Recommended referral to nutrition, compazine 5mg  Q8H PRN, referral to Dr. Ebony Cargo at [facility], continue with boost nutrition shakes  Present: Patient states that she was shocked when she received the referral for [facility] because she had already seen them in the past (Dr. Alycia Rossetti).  She states she was a "mystery patient" and she thought she was going to be going to see someone at [facility] or [place]. She did schedule at appt with Dr. Ebony Cargo which is in the February. She did see the dietician and has an upcoming appt with them. Continues to have nausea and upper abdominal pain. She is doing nauzene PRN which helps some. She tried compazine which did not provide much relief. She is drinking boost/ensure, tries to do 2  per day. She is able to tolerate more softer, bland foods sometimes. She continues to have issues with dysphagia, sometimes will have issues with liquids but not all the time. She continues to have diarrhea, if she leaves the house she takes 2 imodium, if not she will start with 1 and take more as needed. Usually just one episode of diarrhea, imodium seems to help with her symptoms. No rectal bleeding or melena. Weight is 99 lbs today, she has remained between 99-103 lbs over the last 9 months.     Mesenteric duplex 01/2022:  CT A/P with contrast: 06/2021 No abnormality to explain the clinical presentation. No visible change since the study last year. HIDA: EF 79% [place] Abd Limited: 01/2022  No acute sonographic findings in the imaged abdomen.  2.  Multiple benign-appearing simple and minimally complex renal cysts.  GES: 01/2023 Severely rapid gastric emptying with only 2% residual in the stomach at 60 minutes. No further imaging was performed due to lack of activity within the stomach.  Last Colonoscopy:02/2021 diverticulosis, multiple polyps? Last Endoscopy:01/2022 [facility] mild reactive gastropathy-no h  pylori   Past Medical History:  Diagnosis Date   Abnormal MRI    Back pain    Benign cyst of breast, left    Diverticulosis    mild   Gastritis    Headache    Hypertension    Microscopic hematuria 07/28/2020   Worked up by [facility] urology.  Cystoscope negative.  August 2021.  CAT scan 2018 negative   Nausea  Nutcracker esophagus    Osteoporosis    Sciatica of left side    Tinnitus    Tubular adenoma 09/20/2021   Colon 2022 next one 2025, Jarold Song, Dr Waynetta Pean   Unsteady gait     Past Surgical History:  Procedure Laterality Date   ABDOMINAL HYSTERECTOMY     BREAST BIOPSY Left 06/02/2016   ESOPHAGEAL MANOMETRY     FLEXIBLE SIGMOIDOSCOPY     FOOT SURGERY     SHOULDER SURGERY      Current Outpatient Medications  Medication Sig Dispense Refill   allopurinol (ZYLOPRIM) 100  MG tablet 2 (two) times daily.      calcium carbonate (OSCAL) 1500 (600 Ca) MG TABS tablet Take 1,200 mg by mouth daily with breakfast.     Dextrose-Fructose-Sod Citrate (NAUZENE PO) Take by mouth. 2 chews Prn nausea     Fish Oil OIL Take 1,000 mg by mouth.     folic acid (FOLVITE) 1 MG tablet Take 2 mg by mouth daily.     lisinopril (ZESTRIL) 5 MG tablet TAKE 1/2 TABLET EVERY DAY 45 tablet 3   Magnesium 200 MG TABS Take 250 mg by mouth daily.     methotrexate (RHEUMATREX) 2.5 MG tablet Take by mouth.     Multiple Vitamins-Minerals (CENTRUM SILVER PO) Take by mouth daily.     Multiple Vitamins-Minerals (ICAPS AREDS 2 PO) Take by mouth. 1 am and 1 qhs     rosuvastatin (CRESTOR) 5 MG tablet TAKE 1 TABLET EVERY DAY 90 tablet 3   No current facility-administered medications for this visit.    Allergies as of 09/24/2023   (No Known Allergies)    Family History  Problem Relation Age of Onset   Breast cancer Mother    Heart disease Mother    Aneurysm Father    Colon cancer Neg Hx    Stomach cancer Neg Hx    Rectal cancer Neg Hx    Esophageal cancer Neg Hx    Liver cancer Neg Hx     Social History   Socioeconomic History   Marital status: Widowed    Spouse name: Not on file   Number of children: 1   Years of education: some college   Highest education level: Not on file  Occupational History   Occupation: part time  [employer]  Tobacco Use   Smoking status: Never   Smokeless tobacco: Never  Vaping Use   Vaping status: Never Used  Substance and Sexual Activity   Alcohol use: No   Drug use: No   Sexual activity: Not on file  Other Topics Concern   Not on file  Social History Narrative   Lives at home alone. Widowed at age 67.    Right-handed.   Occasional caffeine.   Social Determinants of Health   Financial Resource Strain: Low Risk  (09/25/2022)   Overall Financial Resource Strain (CARDIA)    Difficulty of Paying Living Expenses: Not hard at all  Food  Insecurity: No Food Insecurity (09/25/2022)   Hunger Vital Sign    Worried About Running Out of Food in the Last Year: Never true    Ran Out of Food in the Last Year: Never true  Transportation Needs: No Transportation Needs (09/25/2022)   PRAPARE - Administrator, Civil Service (Medical): No    Lack of Transportation (Non-Medical): No  Physical Activity: Insufficiently Active (09/25/2022)   Exercise Vital Sign    Days of Exercise per Week: 3 days  Minutes of Exercise per Session: 30 min  Stress: No Stress Concern Present (09/25/2022)   [employer] of Occupational Health - Occupational Stress Questionnaire    Feeling of Stress : Not at all  Social Connections: Moderately Isolated (09/25/2022)   Social Connection and Isolation Panel [NHANES]    Frequency of Communication with Friends and Family: More than three times a week    Frequency of Social Gatherings with Friends and Family: Twice a week    Attends Religious Services: More than 4 times per year    Active Member of [employer] or Organizations: No    Attends Banker Meetings: Never    Marital Status: Widowed    Review of systems General: negative for malaise, night sweats, fever, chills, weight loss Neck: Negative for lumps, goiter, pain and significant neck swelling Resp: Negative for cough, wheezing, dyspnea at rest CV: Negative for chest pain, leg swelling, palpitations, orthopnea GI: denies melena, hematochezia, nausea, vomiting,constipation, odynophagia, early satiety or unintentional weight loss. +nausea +dysphagia +abdominal discomfort MSK: Negative for joint pain or swelling, back pain, and muscle pain. Derm: Negative for itching or rash Psych: Denies depression, anxiety, memory loss, confusion. No homicidal or suicidal ideation.  Heme: Negative for prolonged bleeding, bruising easily, and swollen nodes. Endocrine: Negative for cold or heat intolerance, polyuria, polydipsia and goiter. Neuro:  negative for tremor, gait imbalance, syncope and seizures. The remainder of the review of systems is noncontributory.  Physical Exam: BP (!) 92/54 (BP Location: Left Arm, Patient Position: Sitting, Cuff Size: Normal)   Pulse 81   Temp 98.1 F (36.7 C) (Oral)   Ht 5\' 2"  (1.575 m)   Wt 99 lb 12.8 oz (45.3 kg)   BMI 18.25 kg/m  General:   Alert and oriented. No distress noted. Pleasant and cooperative.  Head:  Normocephalic and atraumatic. Eyes:  Conjuctiva clear without scleral icterus. Mouth:  Oral mucosa pink and moist. Good dentition. No lesions. Heart: Normal rate and rhythm, s1 and s2 heart sounds present.  Lungs: Clear lung sounds in all lobes. Respirations equal and unlabored. Abdomen:  +BS, soft, and non-distended. +TTP of diffuse upper abdomen. No rebound or guarding. No HSM or masses noted. Derm: No palmar erythema or jaundice Msk:  Symmetrical without gross deformities. Normal posture. Extremities:  Without edema. Neurologic:  Alert and  oriented x4 Psych:  Alert and cooperative. Normal mood and affect.  Invalid input(s): "6 MONTHS"   ASSESSMENT: Shelly Stein is a 77 y.o. female presenting today for follow up of dumping syndrome, dysphagia, abdominal pain   Abdominal pain/nausea/diarrhea: GES done previously with evidence of dumping syndrome, ongoing symptoms for the past 2 or so years with multiple testing done, as outlined above. Weight appears to be stable between 99-103 over the past few months. She did not tolerate precose in the past, was also tried on bentyl which did not help. Doing protein shakes 1-2 x/day and meeting with dietician. She uses nauzene and imodium which provide some relief.    Nutcracker esophagus/dysphagia: patient reports she was told she had nutcracker esophagus in the past, previously following at [facility] but her doctor (Dr. Alycia Rossetti) retired. She was referred to Dr. Ebony Cargo and has an appt with them in February, she is hesitant to go back to  [facility] as she states she saw Dr. Alycia Rossetti in the past and was told she was a "mystery patient" she does not feel that there will be anything they can offer her. I encouraged her to keep the appt  as she has not yet seen a motility specialist.    PLAN:  Continue with Nauzene  2. Continue with boost/ensure protein shakes  3. Continue to follow with Nutritionist  4. Keep appt with Dr. Ebony Cargo 5. Continue imodium PRN  All questions were answered, patient verbalized understanding and is in agreement with plan as outlined above.   Follow Up: March 2025  Chariti Havel L. Jeanmarie Hubert, MSN, APRN, AGNP-C Adult-Gerontology Nurse Practitioner [facility]  I have reviewed the note and agree with the APP's assessment as described in this progress note  Katrinka Blazing, MD Gastroenterology and Hepatology [facility] Gastroenterology

## 2023-09-24 NOTE — Patient Instructions (Signed)
As we discussed, I would recommend keeping the appt with Dr Ebony Cargo in February as he is a motility specialist You can continue to do nauzene and imodium as needed Please keep following with the dietician and utilizing boost/ensure protein shakes between meals to help with added nutrition  Follow up march 2025.

## 2023-09-26 DIAGNOSIS — N281 Cyst of kidney, acquired: Secondary | ICD-10-CM | POA: Diagnosis not present

## 2023-09-26 DIAGNOSIS — R350 Frequency of micturition: Secondary | ICD-10-CM | POA: Diagnosis not present

## 2023-09-26 DIAGNOSIS — R3121 Asymptomatic microscopic hematuria: Secondary | ICD-10-CM | POA: Diagnosis not present

## 2023-10-04 DIAGNOSIS — Z961 Presence of intraocular lens: Secondary | ICD-10-CM | POA: Diagnosis not present

## 2023-10-12 ENCOUNTER — Ambulatory Visit (INDEPENDENT_AMBULATORY_CARE_PROVIDER_SITE_OTHER): Payer: Medicare HMO

## 2023-10-12 VITALS — Ht 62.0 in | Wt 99.0 lb

## 2023-10-12 DIAGNOSIS — Z0000 Encounter for general adult medical examination without abnormal findings: Secondary | ICD-10-CM

## 2023-10-12 NOTE — Patient Instructions (Signed)
Shelly Stein , Thank you for taking time to come for your Medicare Wellness Visit. I appreciate your ongoing commitment to your health goals. Please review the following plan we discussed and let me know if I can assist you in the future.   Referrals/Orders/Follow-Ups/Clinician Recommendations: Aim for 30 minutes of exercise or brisk walking, 6-8 glasses of water, and 5 servings of fruits and vegetables each day.  This is a list of the screening recommended for you and due dates:  Health Maintenance  Topic Date Due   Hepatitis C Screening  Never done   DTaP/Tdap/Td vaccine (1 - Tdap) Never done   COVID-19 Vaccine (4 - 2023-24 season) 08/12/2023   Flu Shot  03/10/2024*   Colon Cancer Screening  02/11/2024   Medicare Annual Wellness Visit  10/11/2024   Pneumonia Vaccine  Completed   DEXA scan (bone density measurement)  Completed   Zoster (Shingles) Vaccine  Completed   HPV Vaccine  Aged Out  *Topic was postponed. The date shown is not the original due date.    Advanced directives: (ACP Link)Information on Advanced Care Planning can be found at [address] [facility] Advance Health Care Directives Advance Health Care Directives ([email or web address]   Next Medicare Annual Wellness Visit scheduled for next year: Yes

## 2023-10-12 NOTE — Progress Notes (Signed)
Subjective:   Shelly Stein is a 77 y.o. female who presents for Medicare Annual (Subsequent) preventive examination.  Visit Complete: Virtual I connected with  Shelly Stein on 10/12/23 by a audio enabled telemedicine application and verified that I am speaking with the correct person using two identifiers.  Patient Location: Home  Provider Location: Home Office  I discussed the limitations of evaluation and management by telemedicine. The patient expressed understanding and agreed to proceed.  Vital Signs: Because this visit was a virtual/telehealth visit, some criteria may be missing or patient reported. Any vitals not documented were not able to be obtained and vitals that have been documented are patient reported.  Patient Medicare AWV questionnaire was completed by the patient on 09/12/23; I have confirmed that all information answered by patient is correct and no changes since this date.  Cardiac Risk Factors include: advanced age (>3men, >71 women);hypertension     Objective:    Today's Vitals   10/12/23 0838  Weight: 99 lb (44.9 kg)  Height: 5\' 2"  (1.575 m)   Body mass index is 18.11 kg/m.     10/12/2023    8:50 AM 09/25/2022    2:55 PM 11/30/2021   10:15 AM 09/20/2021    2:08 PM 08/06/2018   12:53 PM 07/25/2018    1:47 PM 07/02/2018    3:14 PM  Advanced Directives  Does Patient Have a Medical Advance Directive? No No Yes Yes Yes Yes Yes  Type of Surveyor, minerals;Living will Healthcare Power of [place];Living will Healthcare Power of [place];Living will Healthcare Power of [place];Living will Healthcare Power of [place];Living will  Does patient want to make changes to medical advance directive?   No - Patient declined      Copy of Healthcare Power of Attorney in Chart?   No - copy requested No - copy requested     Would patient like information on creating a medical advance directive? Yes (MAU/Ambulatory/Procedural  Areas - Information given) No - Patient declined         Current Medications (verified) Outpatient Encounter Medications as of 10/12/2023  Medication Sig   allopurinol (ZYLOPRIM) 100 MG tablet 2 (two) times daily.    calcium carbonate (OSCAL) 1500 (600 Ca) MG TABS tablet Take 1,200 mg by mouth daily with breakfast.   Dextrose-Fructose-Sod Citrate (NAUZENE PO) Take by mouth. 2 chews Prn nausea   Fish Oil OIL Take 1,000 mg by mouth.   folic acid (FOLVITE) 1 MG tablet Take 2 mg by mouth daily.   lisinopril (ZESTRIL) 5 MG tablet TAKE 1/2 TABLET EVERY DAY   Magnesium 200 MG TABS Take 250 mg by mouth daily.   methotrexate (RHEUMATREX) 2.5 MG tablet Take by mouth.   Multiple Vitamins-Minerals (CENTRUM SILVER PO) Take by mouth daily.   Multiple Vitamins-Minerals (ICAPS AREDS 2 PO) Take by mouth. 1 am and 1 qhs   rosuvastatin (CRESTOR) 5 MG tablet TAKE 1 TABLET EVERY DAY   No facility-administered encounter medications on file as of 10/12/2023.    Allergies (verified) Patient has no known allergies.   History: Past Medical History:  Diagnosis Date   Abnormal MRI    Back pain    Benign cyst of breast, left    Diverticulosis    mild   Gastritis    Headache    Hypertension    Microscopic hematuria 07/28/2020   Worked up by [facility] urology.  Cystoscope negative.  August 2021.  CAT scan 2018 negative  Nausea    Nutcracker esophagus    Osteoporosis    Sciatica of left side    Tinnitus    Tubular adenoma 09/20/2021   Colon 2022 next one 2025, Jarold Song, Dr Waynetta Pean   Unsteady gait    Past Surgical History:  Procedure Laterality Date   ABDOMINAL HYSTERECTOMY     BREAST BIOPSY Left 06/02/2016   ESOPHAGEAL MANOMETRY     FLEXIBLE SIGMOIDOSCOPY     FOOT SURGERY     SHOULDER SURGERY     Family History  Problem Relation Age of Onset   Breast cancer Mother    Heart disease Mother    Aneurysm Father    Colon cancer Neg Hx    Stomach cancer Neg Hx    Rectal cancer Neg Hx     Esophageal cancer Neg Hx    Liver cancer Neg Hx    Social History   Socioeconomic History   Marital status: Widowed    Spouse name: Not on file   Number of children: 1   Years of education: some college   Highest education level: Not on file  Occupational History   Occupation: part time  [employer]  Tobacco Use   Smoking status: Never   Smokeless tobacco: Never  Vaping Use   Vaping status: Never Used  Substance and Sexual Activity   Alcohol use: No   Drug use: No   Sexual activity: Not on file  Other Topics Concern   Not on file  Social History Narrative   Lives at home alone. Widowed at age 7.    Right-handed.   Occasional caffeine.   Social Determinants of Health   Financial Resource Strain: Low Risk  (10/08/2023)   Overall Financial Resource Strain (CARDIA)    Difficulty of Paying Living Expenses: Not hard at all  Food Insecurity: No Food Insecurity (10/08/2023)   Hunger Vital Sign    Worried About Running Out of Food in the Last Year: Never true    Ran Out of Food in the Last Year: Never true  Transportation Needs: No Transportation Needs (10/08/2023)   PRAPARE - Administrator, Civil Service (Medical): No    Lack of Transportation (Non-Medical): No  Physical Activity: Insufficiently Active (10/12/2023)   Exercise Vital Sign    Days of Exercise per Week: 3 days    Minutes of Exercise per Session: 30 min  Stress: No Stress Concern Present (10/08/2023)   [employer] of Occupational Health - Occupational Stress Questionnaire    Feeling of Stress : Not at all  Social Connections: Moderately Isolated (10/08/2023)   Social Connection and Isolation Panel [NHANES]    Frequency of Communication with Friends and Family: More than three times a week    Frequency of Social Gatherings with Friends and Family: More than three times a week    Attends Religious Services: More than 4 times per year    Active Member of [employer] or Organizations: No     Attends Banker Meetings: Never    Marital Status: Widowed    Tobacco Counseling Counseling given: Not Answered   Clinical Intake:  Pre-visit preparation completed: Yes  Pain : No/denies pain  Diabetes: No  How often do you need to have someone help you when you read instructions, pamphlets, or other written materials from your doctor or pharmacy?: 1 - Never  Interpreter Needed?: No  Information entered by :: Kandis Fantasia LPN   Activities of Daily Living  10/08/2023    9:20 AM  In your present state of health, do you have any difficulty performing the following activities:  Hearing? 0  Vision? 0  Difficulty concentrating or making decisions? 0  Walking or climbing stairs? 0  Dressing or bathing? 0  Doing errands, shopping? 0  Preparing Food and eating ? N  Using the Toilet? N  In the past six months, have you accidently leaked urine? N  Do you have problems with loss of bowel control? N  Managing your Medications? N  Managing your Finances? N  Housekeeping or managing your Housekeeping? N    Patient Care Team: Babs Sciara, MD as PCP - General (Family Medicine) Kathi Der, MD as Consulting Physician (Gastroenterology)  Indicate any recent Medical Services you may have received from other than [facility] providers in the past year (date may be approximate).     Assessment:   This is a routine wellness examination for [place].  Hearing/Vision screen Hearing Screening - Comments:: Denies hearing difficulties   Vision Screening - Comments:: Wears rx glasses - up to date with routine eye exams with Dr. Jill Alexanders     Goals Addressed   None   Depression Screen    10/12/2023    8:48 AM 07/10/2023    8:06 AM 07/04/2023   10:37 AM 01/03/2023    9:59 AM 09/25/2022    2:52 PM 01/20/2022    8:37 AM 11/30/2021   10:10 AM  PHQ 2/9 Scores  PHQ - 2 Score 0 0 0 0 0 0 0  PHQ- 9 Score   6 2 0      Fall Risk    10/08/2023    9:20 AM 07/10/2023     8:06 AM 07/04/2023   10:37 AM 09/25/2022    2:55 PM 06/19/2022    8:10 AM  Fall Risk   Falls in the past year? 1 0 0 0 0  Number falls in past yr: 0 0 0 0 0  Injury with Fall? 0 0 0 0 0  Risk for fall due to : History of fall(s)   No Fall Risks No Fall Risks  Follow up Falls evaluation completed;Education provided;Falls prevention discussed   Falls prevention discussed;Falls evaluation completed Falls evaluation completed    MEDICARE RISK AT HOME: Medicare Risk at Home Any stairs in or around the home?: Yes If so, are there any without handrails?: No Home free of loose throw rugs in walkways, pet beds, electrical cords, etc?: Yes Adequate lighting in your home to reduce risk of falls?: Yes Life alert?: No Use of a cane, walker or w/c?: No Grab bars in the bathroom?: No Shower chair or bench in shower?: Yes Elevated toilet seat or a handicapped toilet?: Yes  TIMED UP AND GO:  Was the test performed?  No    Cognitive Function:        10/12/2023    8:50 AM 09/25/2022    2:56 PM 09/20/2021    2:12 PM  6CIT Screen  What Year? 0 points 0 points 0 points  What month? 0 points 0 points 0 points  What time? 0 points 0 points 0 points  Count back from 20 0 points 0 points 0 points  Months in reverse 0 points 0 points 0 points  Repeat phrase 0 points 0 points 0 points  Total Score 0 points 0 points 0 points    Immunizations Immunization History  Administered Date(s) Administered   Influenza Split 12/25/2022  Moderna Sars-Covid-2 Vaccination 09/15/2020, 09/28/2020, 10/13/2020, 01/11/2021   Pneumococcal Conjugate-13 10/23/2014   Pneumococcal Polysaccharide-23 08/12/2011   Zoster Recombinant(Shingrix) 08/14/2019, 11/04/2019    TDAP status: Due, Education has been provided regarding the importance of this vaccine. Advised may receive this vaccine at local pharmacy or Health Dept. Aware to provide a copy of the vaccination record if obtained from local pharmacy or Health  Dept. Verbalized acceptance and understanding.  Flu Vaccine status: Due, Education has been provided regarding the importance of this vaccine. Advised may receive this vaccine at local pharmacy or Health Dept. Aware to provide a copy of the vaccination record if obtained from local pharmacy or Health Dept. Verbalized acceptance and understanding.  Pneumococcal vaccine status: Up to date  Covid-19 vaccine status: Information provided on how to obtain vaccines.   Qualifies for Shingles Vaccine? Yes   Zostavax completed No   Shingrix Completed?: Yes  Screening Tests Health Maintenance  Topic Date Due   Hepatitis C Screening  Never done   DTaP/Tdap/Td (1 - Tdap) Never done   COVID-19 Vaccine (4 - 2023-24 season) 08/12/2023   INFLUENZA VACCINE  03/10/2024 (Originally 07/12/2023)   Colonoscopy  02/11/2024   Medicare Annual Wellness (AWV)  10/11/2024   Pneumonia Vaccine 30+ Years old  Completed   DEXA SCAN  Completed   Zoster Vaccines- Shingrix  Completed   HPV VACCINES  Aged Out    Health Maintenance  Health Maintenance Due  Topic Date Due   Hepatitis C Screening  Never done   DTaP/Tdap/Td (1 - Tdap) Never done   COVID-19 Vaccine (4 - 2023-24 season) 08/12/2023    Colorectal cancer screening: Type of screening: Colonoscopy. Completed 02/10/21. Repeat every 3 years  Mammogram status: Completed 08/2023. Repeat every year (with gyn; will request records)  Bone Density status: Completed 06/14/18. Results reflect: Bone density results: OSTEOPOROSIS. Repeat every 2 years.  Lung Cancer Screening: (Low Dose CT Chest recommended if Age 36-80 years, 20 pack-year currently smoking OR have quit w/in 15years.) does not qualify.   Lung Cancer Screening Referral: n/a  Additional Screening:  Hepatitis C Screening: does qualify  Vision Screening: Recommended annual ophthalmology exams for early detection of glaucoma and other disorders of the eye. Is the patient up to date with their annual eye  exam?  Yes  Who is the provider or what is the name of the office in which the patient attends annual eye exams? Dr. Jill Alexanders  If pt is not established with a provider, would they like to be referred to a provider to establish care? No .   Dental Screening: Recommended annual dental exams for proper oral hygiene  Community Resource Referral / Chronic Care Management: CRR required this visit?  No   CCM required this visit?  No     Plan:     I have personally reviewed and noted the following in the patient's chart:   Medical and social history Use of alcohol, tobacco or illicit drugs  Current medications and supplements including opioid prescriptions. Patient is not currently taking opioid prescriptions. Functional ability and status Nutritional status Physical activity Advanced directives List of other physicians Hospitalizations, surgeries, and ER visits in previous 12 months Vitals Screenings to include cognitive, depression, and falls Referrals and appointments  In addition, I have reviewed and discussed with patient certain preventive protocols, quality metrics, and best practice recommendations. A written personalized care plan for preventive services as well as general preventive health recommendations were provided to patient.  Kandis Fantasia [place], [place]   03/15/4097   After Visit Summary: (MyChart) Due to this being a telephonic visit, the after visit summary with patients personalized plan was offered to patient via MyChart   Nurse Notes: No concerns at this time;  would like to discuss GI appointments at next office visit

## 2023-10-16 DIAGNOSIS — M0579 Rheumatoid arthritis with rheumatoid factor of multiple sites without organ or systems involvement: Secondary | ICD-10-CM | POA: Diagnosis not present

## 2023-10-30 DIAGNOSIS — M0579 Rheumatoid arthritis with rheumatoid factor of multiple sites without organ or systems involvement: Secondary | ICD-10-CM | POA: Diagnosis not present

## 2023-11-12 DIAGNOSIS — M0579 Rheumatoid arthritis with rheumatoid factor of multiple sites without organ or systems involvement: Secondary | ICD-10-CM | POA: Diagnosis not present

## 2023-11-12 DIAGNOSIS — K219 Gastro-esophageal reflux disease without esophagitis: Secondary | ICD-10-CM | POA: Diagnosis not present

## 2023-11-12 DIAGNOSIS — M549 Dorsalgia, unspecified: Secondary | ICD-10-CM | POA: Diagnosis not present

## 2023-11-12 DIAGNOSIS — M79643 Pain in unspecified hand: Secondary | ICD-10-CM | POA: Diagnosis not present

## 2023-11-12 DIAGNOSIS — M109 Gout, unspecified: Secondary | ICD-10-CM | POA: Diagnosis not present

## 2023-11-12 DIAGNOSIS — M810 Age-related osteoporosis without current pathological fracture: Secondary | ICD-10-CM | POA: Diagnosis not present

## 2023-11-12 DIAGNOSIS — M542 Cervicalgia: Secondary | ICD-10-CM | POA: Diagnosis not present

## 2023-11-12 DIAGNOSIS — Z79899 Other long term (current) drug therapy: Secondary | ICD-10-CM | POA: Diagnosis not present

## 2023-11-12 DIAGNOSIS — M1990 Unspecified osteoarthritis, unspecified site: Secondary | ICD-10-CM | POA: Diagnosis not present

## 2023-11-13 DIAGNOSIS — M810 Age-related osteoporosis without current pathological fracture: Secondary | ICD-10-CM | POA: Diagnosis not present

## 2023-11-13 DIAGNOSIS — M0579 Rheumatoid arthritis with rheumatoid factor of multiple sites without organ or systems involvement: Secondary | ICD-10-CM | POA: Diagnosis not present

## 2023-11-14 DIAGNOSIS — Z961 Presence of intraocular lens: Secondary | ICD-10-CM | POA: Diagnosis not present

## 2023-12-11 DIAGNOSIS — M0579 Rheumatoid arthritis with rheumatoid factor of multiple sites without organ or systems involvement: Secondary | ICD-10-CM | POA: Diagnosis not present

## 2023-12-27 DIAGNOSIS — R350 Frequency of micturition: Secondary | ICD-10-CM | POA: Diagnosis not present

## 2023-12-27 DIAGNOSIS — N281 Cyst of kidney, acquired: Secondary | ICD-10-CM | POA: Diagnosis not present

## 2024-01-04 ENCOUNTER — Encounter: Payer: Self-pay | Admitting: Family Medicine

## 2024-01-04 ENCOUNTER — Ambulatory Visit (INDEPENDENT_AMBULATORY_CARE_PROVIDER_SITE_OTHER): Payer: Medicare HMO | Admitting: Family Medicine

## 2024-01-04 VITALS — BP 139/72 | HR 73 | Temp 97.0°F | Ht 62.0 in | Wt 107.0 lb

## 2024-01-04 DIAGNOSIS — E7849 Other hyperlipidemia: Secondary | ICD-10-CM

## 2024-01-04 DIAGNOSIS — Z79899 Other long term (current) drug therapy: Secondary | ICD-10-CM

## 2024-01-04 DIAGNOSIS — I10 Essential (primary) hypertension: Secondary | ICD-10-CM

## 2024-01-04 DIAGNOSIS — R634 Abnormal weight loss: Secondary | ICD-10-CM | POA: Diagnosis not present

## 2024-01-05 ENCOUNTER — Encounter: Payer: Self-pay | Admitting: Family Medicine

## 2024-01-05 LAB — BASIC METABOLIC PANEL
BUN/Creatinine Ratio: 17 (ref 12–28)
BUN: 15 mg/dL (ref 8–27)
CO2: 25 mmol/L (ref 20–29)
Calcium: 9.8 mg/dL (ref 8.7–10.3)
Chloride: 107 mmol/L — ABNORMAL HIGH (ref 96–106)
Creatinine, Ser: 0.88 mg/dL (ref 0.57–1.00)
Glucose: 99 mg/dL (ref 70–99)
Potassium: 4.1 mmol/L (ref 3.5–5.2)
Sodium: 143 mmol/L (ref 134–144)
eGFR: 68 mL/min/{1.73_m2} (ref >59)

## 2024-01-05 LAB — HEPATIC FUNCTION PANEL
ALT: 12 [IU]/L (ref 0–32)
AST: 25 [IU]/L (ref 0–40)
Albumin: 4.2 g/dL (ref 3.8–4.8)
Alkaline Phosphatase: 46 [IU]/L (ref 44–121)
Bilirubin Total: 0.3 mg/dL (ref 0.0–1.2)
Bilirubin, Direct: 0.12 mg/dL (ref 0.00–0.40)
Total Protein: 6.1 g/dL (ref 6.0–8.5)

## 2024-01-05 LAB — LIPID PANEL
Chol/HDL Ratio: 1.9 {ratio} (ref 0.0–4.4)
Cholesterol, Total: 167 mg/dL (ref 100–199)
HDL: 86 mg/dL (ref >39)
LDL Chol Calc (NIH): 65 mg/dL (ref 0–99)
Triglycerides: 85 mg/dL (ref 0–149)
VLDL Cholesterol Cal: 16 mg/dL (ref 5–40)

## 2024-01-05 NOTE — Progress Notes (Signed)
Subjective:    Patient ID: Shelly Stein, female    DOB: 07-26-1946, 78 y.o.   MRN: [id number]  Discussed the use of AI scribe software for clinical note transcription with the patient, who gave verbal consent to proceed.  History of Present Illness   The patient, with a history of gastroenterological issues and a diagnosis of Nutcracker esophagus, reports a recent referral to a specialist who only deals with esophageal issues. The patient was advised to seek a gastroenterologist in [place], but has chosen to manage her symptoms at home due to the distance. She experiences loose stools, particularly after eating, but denies any hematochezia.  The patient has been actively trying to gain weight, consuming a variety of carbohydrates, fats, and proteins, along with drinks such as ginger ale, Dr. Reino Kent, Boost, Ensure, and milk. Despite these efforts, the patient's weight remains low, with a recent measurement of 107 lbs with clothes on.  In addition to her gastroenterological issues, the patient has been experiencing headaches for the past month. These headaches, rated at a 5 or 6 on a scale of 10, are located in the front of the head and are present upon waking in the morning. The patient manages these headaches with heat or cold packs and occasional Tylenol, though she reports that the Tylenol does not provide significant relief. The patient also takes melatonin at night to aid in sleep.  The patient's father had a history of an aneurysm that burst, leading to a brain bleed and subsequent coma. The patient is concerned about the hereditary nature of aneurysms, but has not experienced any severe headaches, nausea, vomiting, double vision, or one-sided numbness or weakness.         Review of Systems     Objective:    Physical Exam   MEASUREMENTS: WT- 107 with clothes CARDIOVASCULAR: Heart sounds normal upon auscultation. EXTREMITIES: No swelling, redness noted.            Assessment & Plan:  Assessment and Plan    New Onset Headaches Daily headaches for the past month, predominantly in the morning, with a severity of 5-6/10. No associated nausea, vomiting, double vision, or one-sided numbness or weakness. Family history of aneurysm in father. -Order brain scan to rule out serious causes of new onset headaches in older age. -Advise patient to limit Tylenol use to avoid rebound headaches.  Gastrointestinal Issues Patient reports discomfort after eating, but no blood in stool. Previous diagnosis of Nutcracker esophagus. Difficulty finding local gastroenterology care. -Continue current management plan as patient prefers to manage symptoms at home.  Weight Loss Patient reports weight loss, with current weight at 107 lbs (with clothes). Patient is actively trying to gain weight by increasing food intake. -Continue current dietary plan, ensuring a variety of carbohydrates, fats, and proteins. -Order blood work to check kidney function, liver function, and cholesterol.  General Health Maintenance -Flu vaccine received in October 2024. -Continue current medications and supplements, including melatonin for sleep.      1. Other hyperlipidemia (Primary) Continue medication check lipid profile - Lipid Panel  2. High risk medication use Due to high risk medication check lab work - Basic Metabolic Panel - Hepatic Function Panel  3. Essential hypertension Blood pressure under good control continue current measures check metabolic 7  4. Weight loss Patient struggles to keep her weight up due to malabsorption issues has seen numerous gastroenterologist has been diagnosed with nutcracker esophagus and also chronic diarrhea most recent specialist at [facility] recommended a  doctor in [place] but patient states that is too far to go currently she is trying to manage on her own she will follow-up in 6 months

## 2024-01-08 DIAGNOSIS — M0579 Rheumatoid arthritis with rheumatoid factor of multiple sites without organ or systems involvement: Secondary | ICD-10-CM | POA: Diagnosis not present

## 2024-01-29 ENCOUNTER — Encounter: Payer: Self-pay | Admitting: Nutrition

## 2024-01-29 ENCOUNTER — Encounter: Payer: Medicare HMO | Attending: Family Medicine | Admitting: Nutrition

## 2024-01-29 VITALS — Ht 64.0 in | Wt 106.8 lb

## 2024-01-29 DIAGNOSIS — K911 Postgastric surgery syndromes: Secondary | ICD-10-CM | POA: Diagnosis not present

## 2024-01-29 DIAGNOSIS — R634 Abnormal weight loss: Secondary | ICD-10-CM | POA: Insufficient documentation

## 2024-01-29 DIAGNOSIS — R636 Underweight: Secondary | ICD-10-CM | POA: Insufficient documentation

## 2024-01-29 DIAGNOSIS — Z681 Body mass index (BMI) 19 or less, adult: Secondary | ICD-10-CM | POA: Diagnosis not present

## 2024-01-29 NOTE — Progress Notes (Signed)
 Medical Nutrition Therapy  Appointment Start time:  [phone] Appointment End time:  1515 Primary concerns today: Dumping syndrome, Underweight, weight loss   Referral diagnosis: K91.1,  Preferred learning style: No preference  Learning readiness: ready   NUTRITION ASSESSMENT Follow up  "I have been eating everything I can. I just eat regardless of if I am hungry or not. My clothes are way too big." Gained 6-7 lbs since last visit. Appetite better. Drinking 2 Boost per day. She has tried some of the high calorie recipes offered and liked making them. Wt Readings from Last 3 Encounters:  01/29/24 106 lb 12.8 oz (48.4 kg)  01/04/24 107 lb (48.5 kg)  10/12/23 99 lb (44.9 kg)   Ht Readings from Last 3 Encounters:  01/29/24 5\' 4"  (1.626 m)  01/04/24 5\' 2"  (1.575 m)  10/12/23 5\' 2"  (1.575 m)   Body mass index is 18.33 kg/m. @BMIFA @ Facility age limit for growth %iles is 20 years. Facility age limit for growth %iles is 20 years.  Past Medical History:  Diagnosis Date   Abnormal MRI    Back pain    Benign cyst of breast, left    Diverticulosis    mild   Gastritis    Headache    Hypertension    Microscopic hematuria 07/28/2020   Worked up by [facility] urology.  Cystoscope negative.  August 2021.  CAT scan 2018 negative   Nausea    Nutcracker esophagus    Osteoporosis    Sciatica of left side    Tinnitus    Tubular adenoma 09/20/2021   Colon 2022 next one 2025, Jarold Song, Dr Waynetta Pean   Unsteady gait     Anthropometrics  Wt Readings from Last 3 Encounters:  01/04/24 107 lb (48.5 kg)  10/12/23 99 lb (44.9 kg)  09/24/23 99 lb 12.8 oz (45.3 kg)   Ht Readings from Last 3 Encounters:  01/04/24 5\' 2"  (1.575 m)  10/12/23 5\' 2"  (1.575 m)  09/24/23 5\' 2"  (1.575 m)   There is no height or weight on file to calculate BMI. @BMIFA @ Facility age limit for growth %iles is 20 years. Facility age limit for growth %iles is 20 years.   Clinical Medical Hx: See chart Medications:   Current Outpatient Medications on File Prior to Visit  Medication Sig Dispense Refill   allopurinol (ZYLOPRIM) 100 MG tablet 2 (two) times daily.      calcium carbonate (OSCAL) 1500 (600 Ca) MG TABS tablet Take 1,200 mg by mouth daily with breakfast.     Dextrose-Fructose-Sod Citrate (NAUZENE PO) Take by mouth. 2 chews Prn nausea     Fish Oil OIL Take 1,000 mg by mouth.     folic acid (FOLVITE) 1 MG tablet Take 2 mg by mouth daily.     lisinopril (ZESTRIL) 5 MG tablet TAKE 1/2 TABLET EVERY DAY 45 tablet 3   Magnesium 200 MG TABS Take 250 mg by mouth daily.     methotrexate (RHEUMATREX) 2.5 MG tablet Take by mouth.     Multiple Vitamins-Minerals (CENTRUM SILVER PO) Take by mouth daily.     Multiple Vitamins-Minerals (ICAPS AREDS 2 PO) Take by mouth. 1 am and 1 qhs     rosuvastatin (CRESTOR) 5 MG tablet TAKE 1 TABLET EVERY DAY 90 tablet 3   solifenacin (VESICARE) 5 MG tablet Take 5 mg by mouth daily.     No current facility-administered medications on file prior to visit.    Labs:     Latest Ref Rng &  Units 01/04/2024   10:59 AM 07/10/2023    9:24 AM 01/03/2023   11:11 AM  CMP  Glucose 70 - 99 mg/dL 99     BUN 8 - 27 mg/dL 15     Creatinine 2.95 - 1.00 mg/dL 6.21     Sodium 308 - 657 mmol/L 143     Potassium 3.5 - 5.2 mmol/L 4.1     Chloride 96 - 106 mmol/L 107     CO2 20 - 29 mmol/L 25     Calcium 8.7 - 10.3 mg/dL 9.8     Total Protein 6.0 - 8.5 g/dL 6.1  6.2  6.6   Total Bilirubin 0.0 - 1.2 mg/dL 0.3  0.5  0.6   Alkaline Phos 44 - 121 IU/L 46  55  42   AST 0 - 40 IU/L 25  32  30   ALT 0 - 32 IU/L 12  17  13     Lipid Panel     Component Value Date/Time   CHOL 167 01/04/2024 1059   TRIG 85 01/04/2024 1059   HDL 86 01/04/2024 1059   CHOLHDL 1.9 01/04/2024 1059   CHOLHDL 4.1 10/31/2014 1025   VLDL 15 10/31/2014 1025   LDLCALC 65 01/04/2024 1059   LABVLDL 16 01/04/2024 1059    Notable Signs/Symptoms: nausea, loose stools  Lifestyle & Dietary Hx LIves by  herself.  Estimated daily fluid intake: 40 oz  Supplements: MVI, Calcium, Mg, Sleep: varies; sometimes difficult, Stress / self-care: no issues Current average weekly physical activity: ADL  24-Hr Dietary Recall First Meal: Egg with butter and shredded cheese, coffee Snack: Boost Plus Second Meal:  1 cup butterbeans, water  or gingerale , mini ice cream Snack: krispy cream donut,  Third Meal: Pretzels, chips or nut mixture- udually has variety of vegetables. Snack:Boost Plus Beverages: water  Estimated Energy Needs Calories: 1500 Carbohydrate: 170g Protein: 112g Fat: 42g   NUTRITION DIAGNOSIS  Louisa-1.4 Altered GI function As related to dumping syndrome.  As evidenced by loose stools and weight loss..  NUTRITION INTERVENTION  Nutrition education (E-1) on the following topics:  High Calorie High Protein Diet Nutrient density of foods   Handouts Provided Include  High Calorie High Protein Diet   Learning Style & Readiness for Change Teaching method utilized: Visual & Auditory  Demonstrated degree of understanding via: Teach Back  Barriers to learning/adherence to lifestyle change: none  Goals Established by Pt Keep up the great job! Increase high calorie foods. Eat three meals and 3 healthy snacks Continue to increase dried beans, peas, lentils and dark leafy vegetables. Continue to consume 2-3 cans of Boost Plus daily for calorie needs. MONITORING & EVALUATION Dietary intake, weekly physical activity, and weight in 1 month. COnsider a fecal fat test if deemed appropriate.  Next Steps  Patient is to work on eating high calorie high protein foods for desired weight gain.Marland Kitchen

## 2024-01-29 NOTE — Patient Instructions (Signed)
 Goals Established by Pt Keep up the great job! Increase high calorie foods. Eat three meals and 3 healthy snacks Continue to increase dried beans, peas, lentils and dark leafy vegetables. Continue to consume 2-3 cans of Boost Plus daily for calorie needs Aim to get weight to 108 lbs over the next 2-3 months.

## 2024-02-11 DIAGNOSIS — M810 Age-related osteoporosis without current pathological fracture: Secondary | ICD-10-CM | POA: Diagnosis not present

## 2024-02-11 DIAGNOSIS — K219 Gastro-esophageal reflux disease without esophagitis: Secondary | ICD-10-CM | POA: Diagnosis not present

## 2024-02-11 DIAGNOSIS — M0579 Rheumatoid arthritis with rheumatoid factor of multiple sites without organ or systems involvement: Secondary | ICD-10-CM | POA: Diagnosis not present

## 2024-02-11 DIAGNOSIS — M1990 Unspecified osteoarthritis, unspecified site: Secondary | ICD-10-CM | POA: Diagnosis not present

## 2024-02-11 DIAGNOSIS — M549 Dorsalgia, unspecified: Secondary | ICD-10-CM | POA: Diagnosis not present

## 2024-02-11 DIAGNOSIS — M109 Gout, unspecified: Secondary | ICD-10-CM | POA: Diagnosis not present

## 2024-02-11 DIAGNOSIS — M79643 Pain in unspecified hand: Secondary | ICD-10-CM | POA: Diagnosis not present

## 2024-02-11 DIAGNOSIS — Z79899 Other long term (current) drug therapy: Secondary | ICD-10-CM | POA: Diagnosis not present

## 2024-02-11 DIAGNOSIS — M542 Cervicalgia: Secondary | ICD-10-CM | POA: Diagnosis not present

## 2024-02-18 ENCOUNTER — Ambulatory Visit (INDEPENDENT_AMBULATORY_CARE_PROVIDER_SITE_OTHER): Payer: Medicare HMO | Admitting: Gastroenterology

## 2024-02-18 VITALS — BP 109/69 | HR 60 | Temp 97.2°F | Ht 64.0 in | Wt 105.4 lb

## 2024-02-18 DIAGNOSIS — R197 Diarrhea, unspecified: Secondary | ICD-10-CM

## 2024-02-18 DIAGNOSIS — K224 Dyskinesia of esophagus: Secondary | ICD-10-CM | POA: Diagnosis not present

## 2024-02-18 DIAGNOSIS — K219 Gastro-esophageal reflux disease without esophagitis: Secondary | ICD-10-CM | POA: Diagnosis not present

## 2024-02-18 DIAGNOSIS — K911 Postgastric surgery syndromes: Secondary | ICD-10-CM

## 2024-02-18 MED ORDER — OMEPRAZOLE 40 MG PO CPDR: 40.0000 mg | DELAYED_RELEASE_CAPSULE | Freq: Every day | ORAL | 1 refills | Status: AC

## 2024-02-18 NOTE — Progress Notes (Addendum)
 Referring Provider: Babs Sciara, MD Primary Care Physician:  Shelly Sciara, MD Primary GI Physician: Dr. Levon Stein   Chief Complaint  Patient presents with   Gastroesophageal Reflux    Follow up on GERD. Having issues with acid reflux. Takes a spoon full of honey. Has nausea.    HPI:   Shelly Stein is a 78 y.o. female with past medical history of HTN, Nutcracker esophagus, tinnitus, tubular adenoma, RA, dumping syndrome   Patient presenting today for:  GERD Nausea History of dumping syndrome  Nutcracker esophagus   Patient last seen October 2024, at that time states she was upset when she got a referral for [facility] because she had already seen them in the past.  She did not wish to see them again as she was told that time they could not help her.  She does report she did schedule appoint with Dr. Ebony Stein which is in February.  She has upcoming appointment with dietitian as well.  Continues to have nausea and upper abdominal pain.  Doing nausea as needed which helps some.  Compazine did not provide much relief.  Drinking boost/Ensure 2/day.  Continues to have diarrhea and taking 2 Imodium if leaving the house.  Weight stable around 99 pounds at that visit.  Patient recommended to continue nausea, continue boost/Ensure protein shakes, continue to follow nutritionist, keep appoint with Dr. Ebony Stein, continue Imodium as needed  Per chart review, appears patient spoke with DR. Ebony Stein in January and it was agreed upon for her to cancel her visit with him.   Present:  Patient states that she spoke with Dr. Ebony Stein and was told by him that he only did issues with the esophagus and could not help her. He advised she see someone in [place] but she declined as she stated she could not travel that far.  She remains frustrated, tell me that she is a "mystery patient" and nobody can seem to help her over the past few years.  She has nausea still, occurs most day. She states it tends to  wax and wane. She has nauzene but is not really taking this unless absolutely stay.  She notes worsening acid reflux symptoms but not currently on PPI or H2B. Uses nauzene at times/honey sometimes which helps. She notes reflux symptoms have increased, occurring almost daily.  As noted Protonix and Aciphex in the past did not note much improvement with either.  Dysphagia is about the same as previously. Does better with liquids. She states she has been trying to gain weight, she is doing boost/ensure shakes still. Doing vanilla yogurt and jello as well. has liberalized her diet, eating anything she can. Her weight is up to 105 today. She is able to eat solid foods but has to be careful with these.   She continues to have issues with diarrhea. She takes 2 imodium when leaving the house. Usually has diarrhea first thing In the morning and then will usually have a recurrent episode thereafter. Stools are typically watery though can be loose at times.   Seeing nutritionist and was recommended to have fecal fat and pancreatic elastase testing to rule out malabsorption.  Patient states think she has had this testing done in the way past but was unsure of results.  I do not see this per chart review.  Pertinent History:Previously followed with [facility] then GI at [facility], had EGD in 2023, as well as GES, and SIBO breath testing. SIBO testing negative, GES as below (dumping  syndrome). GI pathogen panel, C diff and Pancreatic fecal elastase were all negative. She had been started on Precose and referred to nutrition. Gastrin and chromagranin, TSH, as well as cortisol all WNL in feb 2023 Celiac panel negative Mesenteric duplex 01/2022:  CT A/P with contrast: 06/2021 No abnormality to explain the clinical presentation. No visible change since the study last year. HIDA: EF 79% [place] Abd Limited: 01/2022  No acute sonographic findings in the imaged abdomen.  2.  Multiple benign-appearing simple and minimally complex  renal cysts.  GES: 01/2023 Severely rapid gastric emptying with only 2% residual in the stomach at 60 minutes. No further imaging was performed due to lack of activity within the stomach.  Last Colonoscopy:02/2021 diverticulosis, multiple polyps? Last Endoscopy:01/2022 [facility] mild reactive gastropathy-no h  pylori  Filed Weights   02/18/24 0953  Weight: 105 lb 6.4 oz (47.8 kg)     Past Medical History:  Diagnosis Date   Abnormal MRI    Back pain    Benign cyst of breast, left    Diverticulosis    mild   Gastritis    Headache    Hypertension    Microscopic hematuria 07/28/2020   Worked up by [facility] urology.  Cystoscope negative.  August 2021.  CAT scan 2018 negative   Nausea    Nutcracker esophagus    Osteoporosis    Sciatica of left side    Tinnitus    Tubular adenoma 09/20/2021   Colon 2022 next one 2025, Shelly Stein, Dr Shelly Stein   Unsteady gait     Past Surgical History:  Procedure Laterality Date   ABDOMINAL HYSTERECTOMY     BREAST BIOPSY Left 06/02/2016   ESOPHAGEAL MANOMETRY     FLEXIBLE SIGMOIDOSCOPY     FOOT SURGERY     SHOULDER SURGERY      Current Outpatient Medications  Medication Sig Dispense Refill   allopurinol (ZYLOPRIM) 100 MG tablet 2 (two) times daily.      Dextrose-Fructose-Sod Citrate (NAUZENE PO) Take by mouth. 2 chews Prn nausea     Fish Oil OIL Take 1,000 mg by mouth.     folic acid (FOLVITE) 1 MG tablet Take 2 mg by mouth daily.     lisinopril (ZESTRIL) 5 MG tablet TAKE 1/2 TABLET EVERY DAY 45 tablet 3   Magnesium 200 MG TABS Take 250 mg by mouth daily.     methotrexate (RHEUMATREX) 2.5 MG tablet Take by mouth.     Multiple Vitamins-Minerals (CENTRUM SILVER PO) Take by mouth daily.     Multiple Vitamins-Minerals (ICAPS AREDS 2 PO) Take by mouth. 1 am and 1 qhs     rosuvastatin (CRESTOR) 5 MG tablet TAKE 1 TABLET EVERY DAY 90 tablet 3   solifenacin (VESICARE) 5 MG tablet Take 5 mg by mouth daily.     No current facility-administered  medications for this visit.    Allergies as of 02/18/2024   (No Known Allergies)    Social History   Socioeconomic History   Marital status: Widowed    Spouse name: Not on file   Number of children: 1   Years of education: some college   Highest education level: Not on file  Occupational History   Occupation: part time  [employer]  Tobacco Use   Smoking status: Never   Smokeless tobacco: Never  Vaping Use   Vaping status: Never Used  Substance and Sexual Activity   Alcohol use: No   Drug use: No   Sexual activity:  Not on file  Other Topics Concern   Not on file  Social History Narrative   Lives at home alone. Widowed at age 34.    Right-handed.   Occasional caffeine.   Social Drivers of Corporate investment banker Strain: Low Risk  (10/08/2023)   Overall Financial Resource Strain (CARDIA)    Difficulty of Paying Living Expenses: Not hard at all  Food Insecurity: No Food Insecurity (10/08/2023)   Hunger Vital Sign    Worried About Running Out of Food in the Last Year: Never true    Ran Out of Food in the Last Year: Never true  Transportation Needs: No Transportation Needs (10/08/2023)   PRAPARE - Administrator, Civil Service (Medical): No    Lack of Transportation (Non-Medical): No  Physical Activity: Insufficiently Active (10/12/2023)   Exercise Vital Sign    Days of Exercise per Week: 3 days    Minutes of Exercise per Session: 30 min  Stress: No Stress Concern Present (10/08/2023)   [employer] of Occupational Health - Occupational Stress Questionnaire    Feeling of Stress : Not at all  Social Connections: Moderately Isolated (10/08/2023)   Social Connection and Isolation Panel [NHANES]    Frequency of Communication with Friends and Family: More than three times a week    Frequency of Social Gatherings with Friends and Family: More than three times a week    Attends Religious Services: More than 4 times per year    Active Member of  [employer] or Organizations: No    Attends Banker Meetings: Never    Marital Status: Widowed    Review of systems General: negative for malaise, night sweats, fever, chills, weight loss Neck: Negative for lumps, goiter, pain and significant neck swelling Resp: Negative for cough, wheezing, dyspnea at rest CV: Negative for chest pain, leg swelling, palpitations, orthopnea GI: denies melena, hematochezia, constipation, dysphagia, early satiety or unintentional weight loss. +nausea and vomiting +diarrhea +dysphagia  The remainder of the review of systems is noncontributory.  Physical Exam: BP 109/69   Pulse 60   Temp (!) 97.2 F (36.2 C)   Ht 5\' 4"  (1.626 m)   Wt 105 lb 6.4 oz (47.8 kg)   BMI 18.09 kg/m  General:   Alert and oriented. No distress noted. Pleasant and cooperative.  Head:  Normocephalic and atraumatic. Eyes:  Conjuctiva clear without scleral icterus. Mouth:  Oral mucosa pink and moist. Good dentition. No lesions. Heart: Normal rate and rhythm, s1 and s2 heart sounds present.  Lungs: Clear lung sounds in all lobes. Respirations equal and unlabored. Abdomen:  +BS, soft, generalized TTP, non-distended. No rebound or guarding. No HSM or masses noted. Neurologic:  Alert and  oriented x4 Psych:  Alert and cooperative. Normal mood and affect.  Invalid input(s): "6 MONTHS"   ASSESSMENT: Shelly Stein is a 78 y.o. female presenting today   GERD: has been on protonix and aciphex in the past. Notes worsening symptoms recently. Not currently on PPI/H2B, not taking any otc antacids. Would recommend trying omeprazole 40mg  daily to see if this improves her symptoms.   Abdominal pain/nausea/diarrhea: GES done previously with evidence of dumping syndrome, ongoing symptoms for the past 2+ years with multiple testing done, as outlined above.  Fortunately she has gained some weight by liberalizing her diet and doing protein shakes. did not tolerate precose in the past, was  also tried on bentyl which did not help. She uses nauzene and imodium which  provide some relief.  She has been seen nutritionist and was recommended to have fecal fat and pancreatic elastase testing which I think is reasonable given her symptoms.  I discussed this with the patient who is amenable to having this testing done.   Nutcracker esophagus/dysphagia: patient reports she was told she had nutcracker esophagus in the past, previously following at [facility] but her doctor (Dr. Alycia Rossetti) retired. She was referred to Dr. Ebony Stein and possibly see him in February though was contacted by him prior to her appointment and told he could not help her as he only did issues with the esophagus.  He advised she could see a doctor in [place] that he recommended though she did not want to travel there she stated transportation was an issue for her.  At this time she reports she is able to eat solid foods and just to be careful which she is eating to avoid dysphagia.    PLAN:  -continue to liberalize diet -good chewing precautions  -continue boost/ensure protein shakes  -fecal fat/pancreatic elastase - start omeprazole 40mg  daily   All questions were answered, patient verbalized understanding and is in agreement with plan as outlined above.   Follow Up: 4 months   Rashaunda Rahl L. Jeanmarie Hubert, MSN, APRN, AGNP-C Adult-Gerontology Nurse Practitioner [facility]  I have reviewed the note and agree with the APP's assessment as described in this progress note  Katrinka Blazing, MD Gastroenterology and Hepatology [facility] Gastroenterology

## 2024-02-18 NOTE — Patient Instructions (Signed)
-  continue to liberalize diet and eat what you tolerate -you can use nauzene as needed for nausea and vomiting  -continue boost/ensure protein shakes to help with nutrition  -we will do fecal fat/pancreatic elastase stool testing  - start omeprazole 40mg  daily   Follow up 4 months  It was a pleasure to see you today. I want to create trusting relationships with patients and provide genuine, compassionate, and quality care. I truly value your feedback! please be on the lookout for a survey regarding your visit with me today. I appreciate your input about our visit and your time in completing this!    Quanta Roher L. Jeanmarie Hubert, MSN, APRN, AGNP-C Adult-Gerontology Nurse Practitioner [facility] Gastroenterology at [facility]

## 2024-02-19 ENCOUNTER — Telehealth: Payer: Self-pay | Admitting: *Deleted

## 2024-02-19 NOTE — Telephone Encounter (Signed)
 Copied from CRM [id number]. Topic: General - Other >> Feb 19, 2024  3:25 PM Emylou G wrote: Reason for CRM: Pls call patient checking status of neurologist referral - she called back in 3/6 pls call her back [phone]

## 2024-02-21 ENCOUNTER — Other Ambulatory Visit: Payer: Self-pay

## 2024-02-21 DIAGNOSIS — R519 Headache, unspecified: Secondary | ICD-10-CM

## 2024-02-21 NOTE — Telephone Encounter (Signed)
 Nurses-on her previous visit she did mention that she was having frequent headaches.  Please go ahead with referral to neurology.  Please inquire with the patient if she still having frequent headaches?  Are the headaches more so during the day or in the evening or in the middle of the night?  Any nausea or vomiting?  Any other neurologic symptoms?  May have neurology consult [facility] thank you please give feedback regarding the questions thank you

## 2024-02-21 NOTE — Telephone Encounter (Signed)
 Spoke with patient and she states she wakes up with a daily headache which last through out the day but not severe, there is some nausea, and adds that she has concerning family hx of headache and brain aneurysm and coma in her dad.

## 2024-02-21 NOTE — Telephone Encounter (Signed)
 The initial approach to this would be MRI of the brain with and without contrast Reason nocturnal headaches and early morning headaches new onset Also go ahead with neurology consult

## 2024-02-22 ENCOUNTER — Other Ambulatory Visit: Payer: Self-pay

## 2024-02-22 DIAGNOSIS — R519 Headache, unspecified: Secondary | ICD-10-CM

## 2024-03-07 ENCOUNTER — Encounter: Payer: Self-pay | Admitting: Neurology

## 2024-03-07 ENCOUNTER — Other Ambulatory Visit (HOSPITAL_COMMUNITY)

## 2024-03-10 ENCOUNTER — Encounter (HOSPITAL_COMMUNITY): Payer: Self-pay

## 2024-03-10 ENCOUNTER — Ambulatory Visit (HOSPITAL_COMMUNITY)
Admission: RE | Admit: 2024-03-10 | Discharge: 2024-03-10 | Disposition: A | Discharge status: Home / Self Care | Source: Ambulatory Visit | Attending: Family Medicine | Admitting: Family Medicine

## 2024-03-10 DIAGNOSIS — G319 Degenerative disease of nervous system, unspecified: Secondary | ICD-10-CM | POA: Diagnosis not present

## 2024-03-10 DIAGNOSIS — R519 Headache, unspecified: Secondary | ICD-10-CM | POA: Diagnosis not present

## 2024-03-10 MED ORDER — GADOBUTROL 1 MMOL/ML IV SOLN
5.0000 mL | Freq: Once | INTRAVENOUS | Status: AC | PRN
  Administered 2024-03-10: 5 mL via INTRAVENOUS

## 2024-03-12 ENCOUNTER — Encounter: Payer: Self-pay | Admitting: Family Medicine

## 2024-04-30 DIAGNOSIS — Z0100 Encounter for examination of eyes and vision without abnormal findings: Secondary | ICD-10-CM | POA: Diagnosis not present

## 2024-04-30 DIAGNOSIS — Z961 Presence of intraocular lens: Secondary | ICD-10-CM | POA: Diagnosis not present

## 2024-05-03 ENCOUNTER — Other Ambulatory Visit: Payer: Self-pay | Admitting: Family Medicine

## 2024-06-02 ENCOUNTER — Ambulatory Visit (INDEPENDENT_AMBULATORY_CARE_PROVIDER_SITE_OTHER): Admitting: Gastroenterology

## 2024-06-23 ENCOUNTER — Ambulatory Visit: Admitting: Neurology

## 2024-06-26 ENCOUNTER — Ambulatory Visit (INDEPENDENT_AMBULATORY_CARE_PROVIDER_SITE_OTHER): Admitting: Gastroenterology

## 2024-07-02 ENCOUNTER — Ambulatory Visit: Admitting: Neurology

## 2024-07-07 ENCOUNTER — Ambulatory Visit: Payer: Medicare HMO | Admitting: Family Medicine

## 2024-07-25 DIAGNOSIS — K219 Gastro-esophageal reflux disease without esophagitis: Secondary | ICD-10-CM | POA: Diagnosis not present

## 2024-07-25 DIAGNOSIS — Z79899 Other long term (current) drug therapy: Secondary | ICD-10-CM | POA: Diagnosis not present

## 2024-07-25 DIAGNOSIS — M542 Cervicalgia: Secondary | ICD-10-CM | POA: Diagnosis not present

## 2024-07-25 DIAGNOSIS — M79643 Pain in unspecified hand: Secondary | ICD-10-CM | POA: Diagnosis not present

## 2024-07-25 DIAGNOSIS — M0579 Rheumatoid arthritis with rheumatoid factor of multiple sites without organ or systems involvement: Secondary | ICD-10-CM | POA: Diagnosis not present

## 2024-07-25 DIAGNOSIS — M1990 Unspecified osteoarthritis, unspecified site: Secondary | ICD-10-CM | POA: Diagnosis not present

## 2024-07-25 DIAGNOSIS — M810 Age-related osteoporosis without current pathological fracture: Secondary | ICD-10-CM | POA: Diagnosis not present

## 2024-07-25 DIAGNOSIS — M109 Gout, unspecified: Secondary | ICD-10-CM | POA: Diagnosis not present

## 2024-07-25 DIAGNOSIS — M549 Dorsalgia, unspecified: Secondary | ICD-10-CM | POA: Diagnosis not present

## 2024-08-15 DIAGNOSIS — M810 Age-related osteoporosis without current pathological fracture: Secondary | ICD-10-CM | POA: Diagnosis not present

## 2024-08-15 DIAGNOSIS — M0579 Rheumatoid arthritis with rheumatoid factor of multiple sites without organ or systems involvement: Secondary | ICD-10-CM | POA: Diagnosis not present

## 2024-08-15 DIAGNOSIS — Z79899 Other long term (current) drug therapy: Secondary | ICD-10-CM | POA: Diagnosis not present

## 2024-08-21 DIAGNOSIS — L309 Dermatitis, unspecified: Secondary | ICD-10-CM | POA: Diagnosis not present

## 2024-08-28 DIAGNOSIS — L309 Dermatitis, unspecified: Secondary | ICD-10-CM | POA: Diagnosis not present

## 2024-08-28 DIAGNOSIS — L0100 Impetigo, unspecified: Secondary | ICD-10-CM | POA: Diagnosis not present

## 2024-09-08 ENCOUNTER — Ambulatory Visit: Admitting: Neurology

## 2024-09-17 ENCOUNTER — Ambulatory Visit: Admitting: Neurology

## 2024-09-24 ENCOUNTER — Encounter (INDEPENDENT_AMBULATORY_CARE_PROVIDER_SITE_OTHER): Payer: Self-pay | Admitting: Gastroenterology

## 2024-10-09 ENCOUNTER — Ambulatory Visit: Admitting: Neurology

## 2024-10-10 ENCOUNTER — Ambulatory Visit: Admitting: Family Medicine

## 2024-10-14 ENCOUNTER — Ambulatory Visit: Admitting: Family Medicine

## 2024-10-15 ENCOUNTER — Ambulatory Visit: Admitting: Neurology

## 2024-12-15 ENCOUNTER — Ambulatory Visit: Payer: Self-pay

## 2024-12-15 ENCOUNTER — Other Ambulatory Visit: Payer: Self-pay | Admitting: Family Medicine

## 2024-12-15 DIAGNOSIS — R519 Headache, unspecified: Secondary | ICD-10-CM

## 2024-12-15 DIAGNOSIS — Z8739 Personal history of other diseases of the musculoskeletal system and connective tissue: Secondary | ICD-10-CM

## 2024-12-15 DIAGNOSIS — R112 Nausea with vomiting, unspecified: Secondary | ICD-10-CM

## 2024-12-15 DIAGNOSIS — R233 Spontaneous ecchymoses: Secondary | ICD-10-CM

## 2024-12-15 DIAGNOSIS — E7849 Other hyperlipidemia: Secondary | ICD-10-CM

## 2024-12-15 DIAGNOSIS — I10 Essential (primary) hypertension: Secondary | ICD-10-CM

## 2024-12-15 NOTE — Telephone Encounter (Signed)
 1st attempt to reach patient's son, Alm. LVM with office number  Copied from CRM [id number]. Topic: Clinical - Red Word Triage >> Dec 15, 2024  9:14 AM Antwanette L wrote: Red Word that prompted transfer to Nurse Triage: Alm, the patient's son, is calling because the patient is experiencing severe headaches and has been taking Tylenol . The patient's blood pressure is fine. She has an appointment scheduled for 1/27, and Alm would like to know if lab work can be completed prior to the visit >> Dec 15, 2024  9:28 AM Antwanette L wrote:  Alm the patient son  is unable to hold due to wait time and is requesting a callback at [phone]

## 2024-12-15 NOTE — Telephone Encounter (Signed)
 Please do the labs in the near future preferably fasting but at the same time well-hydrated with water when she goes They will be doing blood work and a urine test  Also find out from the patient if she having any vomiting with the headaches? What is she currently trying for the headaches? Are the headaches all day long or just intermittent throughout the day? Waking up in the morning with a headache? Waking up in the middle of the night with a headache?

## 2024-12-15 NOTE — Telephone Encounter (Signed)
" ° °  Reason for Disposition  [1] MODERATE headache (e.g., interferes with normal activities) AND [2] present > 24 hours AND [3] unexplained  (Exceptions: Pain medicines not tried, typical migraine, or headache part of viral illness.)  Answer Assessment - Initial Assessment Questions Pts son called in. He is not currently with the pt. Headache he thinks for about 6 weeks. BP is normal 110's over 60's, she's not complained of any sinus issues. He says she sees Dr. Alphonsa on the 27th. Son would like to have labs done prior to that so they can discuss the labs in person. He states pt has a high pain tolerance and he would say her headache gets up to 7-8, pain is constant but intensity changes. He denies any higher acuity symptoms. RN did advise if this continues to take her to the ER to be evaluated as opposed to waiting 3 more weeks. RN did advise would send to Dr. Steven staff for their input. He states he doesn't believe it is emergent but is more urgent. RN did again say that with any changes or continued pain he should take her to ER as opposed to UC as they can do more testing. Pt's son stated understanding. Due to pt not being with son, not all questions answered.      1. LOCATION: Where does it hurt?       2. ONSET: When did the headache start? (e.g., minutes, hours, days)      He believes 6 weeks ago.  3. PATTERN: Does the pain come and go, or has it been constant since it started?     Constant but intensity changes 4. SEVERITY: How bad is the pain? and What does it keep you from doing?  (e.g., Scale 1-10; mild, moderate, or severe)     Highest is 8 5. RECURRENT SYMPTOM: Have you ever had headaches before? If Yes, ask: When was the last time? and What happened that time?       6. CAUSE: What do you think is causing the headache?      7. MIGRAINE: Have you been diagnosed with migraine headaches? If Yes, ask: Is this headache similar?       8. HEAD INJURY: Has there  been any recent injury to your head?      denies 9. OTHER SYMPTOMS: Do you have any other symptoms? (e.g., fever, stiff neck, eye pain, sore throat, cold symptoms)     denies  Protocols used: Headache-A-AH  "

## 2024-12-15 NOTE — Telephone Encounter (Signed)
 FYI Only or Action Required?: Action required by provider: clinical question for provider.  Patient was last seen in primary care on 01/04/2024 by Alphonsa Glendia LABOR, MD.  Called Nurse Triage reporting Headache.  Symptoms began several months ago.  Interventions attempted: OTC medications: tylenol .  Symptoms are: unchanged.  Triage Disposition: See Physician Within 24 Hours  Patient/caregiver understands and will follow disposition?: No, wishes to speak with PCP

## 2024-12-17 ENCOUNTER — Ambulatory Visit: Payer: Self-pay

## 2024-12-17 NOTE — Telephone Encounter (Signed)
 Called her son back and made him aware there were no earlier available appointments. He requests to be put on a cancellation wait list

## 2024-12-17 NOTE — Telephone Encounter (Signed)
 Patient has an appointment 12/19/24 @ 8:20

## 2024-12-17 NOTE — Telephone Encounter (Signed)
 Son of pt Shelly Stein requesting to be called regarding scheduling/changes. [phone]   FYI Only or Action Required?: FYI only for provider: appointment scheduled on 12/19/24.  Patient was last seen in primary care on 01/04/2024 by Alphonsa Glendia LABOR, MD.   Copied from CRM [id number]. Topic: Clinical - Red Word Triage >> Dec 15, 2024  9:14 AM Antwanette L wrote: Red Word that prompted transfer to Nurse Triage: Shelly Stein, the patient's son, is calling because the patient is experiencing severe headaches and has been taking Tylenol . The patient's blood pressure is fine. She has an appointment scheduled for 1/27, and Shelly Stein would like to know if lab work can be completed prior to the visit >> Dec 16, 2024 12:44 PM Drema MATSU wrote: Pt is taking tylenol  extra strength, no vommitting, headaches all day long, pt is waking up with the headache and waking up in the middle of the night with labs. Patient son wants to know if pt can take medicine the morning of her labs.  >> Dec 15, 2024  9:28 AM Antwanette L wrote:  Shelly Stein the patient son  is unable to hold due to wait time and is requesting a callback at [phone] Reason for Disposition  Health information question, no triage required and triager able to answer question  Answer Assessment - Initial Assessment Questions 1. REASON FOR CALL: What is the main reason for your call? or How can I best help you?     Son of pt wanting to know if we could change the pt to a sooner appt  Protocols used: Information Only Call - No Triage-A-AH

## 2024-12-17 NOTE — Telephone Encounter (Signed)
 She may have a same-day slot with myself I realize I have limited slots Please look at Tuesday 920 thank you

## 2024-12-19 ENCOUNTER — Ambulatory Visit: Admitting: Family Medicine

## 2024-12-23 ENCOUNTER — Ambulatory Visit (INDEPENDENT_AMBULATORY_CARE_PROVIDER_SITE_OTHER): Admitting: Family Medicine

## 2024-12-23 ENCOUNTER — Ambulatory Visit: Payer: Self-pay | Admitting: Family Medicine

## 2024-12-23 VITALS — BP 135/78 | HR 81 | Temp 97.7°F | Ht 64.0 in | Wt 106.1 lb

## 2024-12-23 DIAGNOSIS — M069 Rheumatoid arthritis, unspecified: Secondary | ICD-10-CM

## 2024-12-23 DIAGNOSIS — F09 Unspecified mental disorder due to known physiological condition: Secondary | ICD-10-CM | POA: Diagnosis not present

## 2024-12-23 DIAGNOSIS — Z79899 Other long term (current) drug therapy: Secondary | ICD-10-CM | POA: Diagnosis not present

## 2024-12-23 DIAGNOSIS — R634 Abnormal weight loss: Secondary | ICD-10-CM | POA: Diagnosis not present

## 2024-12-23 DIAGNOSIS — R519 Headache, unspecified: Secondary | ICD-10-CM

## 2024-12-23 DIAGNOSIS — I10 Essential (primary) hypertension: Secondary | ICD-10-CM

## 2024-12-23 DIAGNOSIS — R413 Other amnesia: Secondary | ICD-10-CM

## 2024-12-23 LAB — LIPASE: Lipase: 49 U/L (ref 14–85)

## 2024-12-23 LAB — BASIC METABOLIC PANEL WITH GFR
BUN/Creatinine Ratio: 25 (ref 12–28)
BUN: 20 mg/dL (ref 8–27)
CO2: 23 mmol/L (ref 20–29)
Calcium: 9.4 mg/dL (ref 8.7–10.3)
Chloride: 108 mmol/L — ABNORMAL HIGH (ref 96–106)
Creatinine, Ser: 0.8 mg/dL (ref 0.57–1.00)
Glucose: 99 mg/dL (ref 70–99)
Potassium: 4.3 mmol/L (ref 3.5–5.2)
Sodium: 144 mmol/L (ref 134–144)
eGFR: 75 mL/min/1.73

## 2024-12-23 LAB — LIPID PANEL
Chol/HDL Ratio: 1.9 ratio (ref 0.0–4.4)
Cholesterol, Total: 146 mg/dL (ref 100–199)
HDL: 78 mg/dL
LDL Chol Calc (NIH): 49 mg/dL (ref 0–99)
Triglycerides: 107 mg/dL (ref 0–149)
VLDL Cholesterol Cal: 19 mg/dL (ref 5–40)

## 2024-12-23 LAB — MAGNESIUM: Magnesium: 1.9 mg/dL (ref 1.6–2.3)

## 2024-12-23 LAB — HEPATIC FUNCTION PANEL
ALT: 24 IU/L (ref 0–32)
AST: 29 IU/L (ref 0–40)
Albumin: 4.1 g/dL (ref 3.8–4.8)
Alkaline Phosphatase: 41 IU/L — ABNORMAL LOW (ref 49–135)
Bilirubin Total: 0.5 mg/dL (ref 0.0–1.2)
Bilirubin, Direct: 0.18 mg/dL (ref 0.00–0.40)
Total Protein: 6.1 g/dL (ref 6.0–8.5)

## 2024-12-23 LAB — SEDIMENTATION RATE: Sed Rate: 2 mm/h (ref 0–40)

## 2024-12-23 LAB — CBC WITH DIFFERENTIAL/PLATELET
Basophils Absolute: 0 x10E3/uL (ref 0.0–0.2)
Basos: 1 %
EOS (ABSOLUTE): 0.3 x10E3/uL (ref 0.0–0.4)
Eos: 5 %
Hematocrit: 39.6 % (ref 34.0–46.6)
Hemoglobin: 13.1 g/dL (ref 11.1–15.9)
Immature Grans (Abs): 0 x10E3/uL (ref 0.0–0.1)
Immature Granulocytes: 0 %
Lymphocytes Absolute: 1.5 x10E3/uL (ref 0.7–3.1)
Lymphs: 24 %
MCH: 32.3 pg (ref 26.6–33.0)
MCHC: 33.1 g/dL (ref 31.5–35.7)
MCV: 98 fL — ABNORMAL HIGH (ref 79–97)
Monocytes Absolute: 0.4 x10E3/uL (ref 0.1–0.9)
Monocytes: 6 %
Neutrophils Absolute: 3.8 x10E3/uL (ref 1.4–7.0)
Neutrophils: 64 %
Platelets: 172 x10E3/uL (ref 150–450)
RBC: 4.05 x10E6/uL (ref 3.77–5.28)
RDW: 14.1 % (ref 11.7–15.4)
WBC: 6 x10E3/uL (ref 3.4–10.8)

## 2024-12-23 LAB — URIC ACID: Uric Acid: 3.2 mg/dL (ref 3.1–7.9)

## 2024-12-23 LAB — MICROALBUMIN / CREATININE URINE RATIO
Creatinine, Urine: 83.5 mg/dL
Microalb/Creat Ratio: 140 mg/g{creat} — ABNORMAL HIGH (ref 0–29)
Microalbumin, Urine: 117 ug/mL

## 2024-12-23 LAB — C-REACTIVE PROTEIN: CRP: <1 mg/L (ref 0–10)

## 2024-12-23 MED ORDER — METHOTREXATE SODIUM 2.5 MG PO TABS: ORAL_TABLET | ORAL | 2 refills | Status: AC

## 2024-12-23 NOTE — Progress Notes (Signed)
 "  Subjective:    Patient ID: Shelly Stein, female    DOB: 14-Apr-1946, 79 y.o.   MRN: [id number]  HPI Patient is in room 9  Patient is here for a 6 month follow up  Frequent headaches-she had multiple headaches last year that at times were more than others but over the past 6 weeks has been having more frequent headaches and is utilizing Tylenol  3-4 times a day she denies any high fever chills sweats neck pain vomiting or other symptoms.  Does not wake her up in the melanite sometimes she does wake up with headaches in the morning.  Low weight not eating large amounts patient does not eat much she eats a small amount at her meals and she does do a little bit of supplements with boost we did discuss trying to increase caloric intake her weight is relatively stable to what it is been over the past 2 years  Progressive forgetfulness-patient relates that she is having increased forgetfulness with difficulty remembering and having multiple times where she repeats herself also where she loses car keys and other items in the house recently has moved to be closer to her son patient does have mild concern of developing dementia  Rheumatoid arthritis-does see specialist for this ran out of her methotrexate  needs a refill she has an appointment with the specialist for later this month  Son-David-power of attorney is here with her today very nice gentleman  Results for orders placed or performed in visit on 12/15/24  Basic metabolic panel with GFR   Collection Time: 12/22/24  8:12 AM  Result Value Ref Range   Glucose 99 70 - 99 mg/dL   BUN 20 8 - 27 mg/dL   Creatinine, Ser 9.19 0.57 - 1.00 mg/dL   eGFR 75 >40 fO/fpw/8.26   BUN/Creatinine Ratio 25 12 - 28   Sodium 144 134 - 144 mmol/L   Potassium 4.3 3.5 - 5.2 mmol/L   Chloride 108 (H) 96 - 106 mmol/L   CO2 23 20 - 29 mmol/L   Calcium  9.4 8.7 - 10.3 mg/dL  Uric acid   Collection Time: 12/22/24  8:12 AM  Result Value Ref Range   Uric Acid  3.2 3.1 - 7.9 mg/dL  Sedimentation rate   Collection Time: 12/22/24  8:12 AM  Result Value Ref Range   Sed Rate 2 0 - 40 mm/hr  CBC with Differential/Platelet   Collection Time: 12/22/24  8:12 AM  Result Value Ref Range   WBC 6.0 3.4 - 10.8 x10E3/uL   RBC 4.05 3.77 - 5.28 x10E6/uL   Hemoglobin 13.1 11.1 - 15.9 g/dL   Hematocrit 60.3 65.9 - 46.6 %   MCV 98 (H) 79 - 97 fL   MCH 32.3 26.6 - 33.0 pg   MCHC 33.1 31.5 - 35.7 g/dL   RDW 85.8 88.2 - 84.5 %   Platelets 172 150 - 450 x10E3/uL   Neutrophils 64 Not Estab. %   Lymphs 24 Not Estab. %   Monocytes 6 Not Estab. %   Eos 5 Not Estab. %   Basos 1 Not Estab. %   Neutrophils Absolute 3.8 1.4 - 7.0 x10E3/uL   Lymphocytes Absolute 1.5 0.7 - 3.1 x10E3/uL   Monocytes Absolute 0.4 0.1 - 0.9 x10E3/uL   EOS (ABSOLUTE) 0.3 0.0 - 0.4 x10E3/uL   Basophils Absolute 0.0 0.0 - 0.2 x10E3/uL   Immature Granulocytes 0 Not Estab. %   Immature Grans (Abs) 0.0 0.0 - 0.1 x10E3/uL  Hepatic  function panel   Collection Time: 12/22/24  8:12 AM  Result Value Ref Range   Total Protein 6.1 6.0 - 8.5 g/dL   Albumin 4.1 3.8 - 4.8 g/dL   Bilirubin Total 0.5 0.0 - 1.2 mg/dL   Bilirubin, Direct 9.81 0.00 - 0.40 mg/dL   Alkaline Phosphatase 41 (L) 49 - 135 IU/L   AST 29 0 - 40 IU/L   ALT 24 0 - 32 IU/L  Lipid panel   Collection Time: 12/22/24  8:12 AM  Result Value Ref Range   Cholesterol, Total 146 100 - 199 mg/dL   Triglycerides 892 0 - 149 mg/dL   HDL 78 >60 mg/dL   VLDL Cholesterol Cal 19 5 - 40 mg/dL   LDL Chol Calc (NIH) 49 0 - 99 mg/dL   Chol/HDL Ratio 1.9 0.0 - 4.4 ratio  Lipase   Collection Time: 12/22/24  8:12 AM  Result Value Ref Range   Lipase 49 14 - 85 U/L  Magnesium   Collection Time: 12/22/24  8:12 AM  Result Value Ref Range   Magnesium 1.9 1.6 - 2.3 mg/dL  C-reactive protein   Collection Time: 12/22/24  8:12 AM  Result Value Ref Range   CRP <1 0 - 10 mg/L  Microalbumin/Creatinine Ratio, Urine   Collection Time: 12/22/24  8:12  AM  Result Value Ref Range   Creatinine, Urine WILL FOLLOW    Microalbumin, Urine WILL FOLLOW    Microalb/Creat Ratio WILL FOLLOW    Labs were reviewed in detail Review of Systems     Objective:   Physical Exam General-in no acute distress Eyes-no discharge Lungs-respiratory rate normal, CTA CV-no murmurs,RRR Extremities skin warm dry no edema Neuro grossly normal Behavior normal, alert        Assessment & Plan:  1. Rheumatoid arthritis, involving unspecified site, unspecified whether rheumatoid factor present (HCC) Methotrexate  refill She has an appointment with her specialist later this month She should get her refills through them after this visit We will send a copy of this office visit as well as labs to the specialist  2. Essential hypertension (Primary) Blood pressure reasonable control continue current measures  3. Frequent headaches Frequent headaches-made worse by overuse analgesia with rebound headaches patient had a MRI back in April of last year, I think the likelihood of tumors or growths causing the headaches is low she has an appointment with neurology coming up we did discuss the possibility of trying Topamax low-dose but currently patient will do her best not to take Tylenol  over the next 2 weeks to see if the headaches will improve We did discuss how the headaches initially will get worse before they get better Inflammatory lab test were normal  It is also possible there could be some sleep apnea despite her being thin neurology may choose to do further workup of this as well  4. Weight loss Weight is currently stable she does not eat as well as she should we did talk about trying to increase caloric intake she does use supplements such as boost  5. High risk medication use Labs were reviewed in detail.  6. Cognitive dysfunction Montreal cognitive score 21 out of 30 Patient would significant short-term memory loss When given 3 items to remember she  can repeat them but after a brief distraction unable to remember any of them Her previous MRI did show some atrophy and changes that combined with what we are seeing now could well be the start of progressive slide toward  dementia has appointment coming up with neurology hold off on adding any medicines currently - TSH - T4, Free - Vitamin B12 - ATN PROFILE  7. Short-term memory loss See discussion above Additional labs ordered Await the results Has appointment with neurology coming up we will share results with them - TSH - T4, Free - Vitamin B12 - ATN PROFILE   "

## 2024-12-28 ENCOUNTER — Ambulatory Visit: Payer: Self-pay | Admitting: Family Medicine

## 2024-12-28 LAB — TSH: TSH: 1.3 u[IU]/mL (ref 0.450–4.500)

## 2024-12-28 LAB — ATN PROFILE
A -- Beta-amyloid 42/40 Ratio: 0.109
Beta-amyloid 40: 211.9 pg/mL
Beta-amyloid 42: 23.02 pg/mL
N -- NfL, Plasma: 5.45 pg/mL (ref 0.00–6.04)
T -- p-tau181: 1.48 pg/mL — AB (ref 0.00–0.97)

## 2024-12-28 LAB — VITAMIN B12: Vitamin B-12: 973 pg/mL (ref 232–1245)

## 2024-12-28 LAB — T4, FREE: Free T4: 1.12 ng/dL (ref 0.82–1.77)

## 2025-01-02 ENCOUNTER — Ambulatory Visit

## 2025-01-06 ENCOUNTER — Ambulatory Visit: Admitting: Family Medicine

## 2025-01-07 ENCOUNTER — Encounter: Payer: Self-pay | Admitting: Neurology

## 2025-01-07 NOTE — Progress Notes (Unsigned)
 "  NEUROLOGY CONSULTATION NOTE  Brandii Lakey MRN: [id number] DOB: June 02, 1946  Referring provider: Glendia Fielding, MD Primary care provider: Glendia Fielding, MD  Reason for consult:  headache and memory  Assessment/Plan:   ***   Subjective:  Shelly Stein is a 79 year old ***-handed female with HTN who presents for headache and memory problems.  History supplemented by referring provider's note.  Headache: ***  MRI of brain with and without contrast on 03/10/2024 did not reveal an acute intracranial abnormality.  Sed rate on 12/22/2024 was 2.  Memory problems: *** MRI of brain with and without contrast on 03/10/2024 revealed mild-to-moderate generalized cerebral atrophy but no significant chronic small vessel ischemic changes or acute intracranial abnormality.     Labs from 12/24/2024 demonstrated B12 973, TSH 1.300, and free T4 1.12.  ATN profile revealed elevated [id number] 1.48 with normal Beta-amyloid 42/40 ratio 0.109, beta-amyloid 42 23.02, beta-amyloid 40 211.90 and normal NfL of 5.45.     PAST MEDICAL HISTORY: Past Medical History:  Diagnosis Date   Abnormal MRI    Back pain    Benign cyst of breast, left    Diverticulosis    mild   Gastritis    Headache    Hypertension    Microscopic hematuria 07/28/2020   Worked up by [facility] urology.  Cystoscope negative.  August 2021.  CAT scan 2018 negative   Nausea    Nutcracker esophagus    Osteoporosis    Sciatica of left side    Tinnitus    Tubular adenoma 09/20/2021   Colon 2022 next one 2025, Margarete Plough, Dr Gareld   Unsteady gait     PAST SURGICAL HISTORY: Past Surgical History:  Procedure Laterality Date   ABDOMINAL HYSTERECTOMY     BREAST BIOPSY Left 06/02/2016   ESOPHAGEAL MANOMETRY     FLEXIBLE SIGMOIDOSCOPY     FOOT SURGERY     SHOULDER SURGERY      MEDICATIONS: Medications Ordered Prior to Encounter[1]  ALLERGIES: Allergies[2]  FAMILY HISTORY: Family History  Problem Relation Age of  Onset   Breast cancer Mother    Heart disease Mother    Aneurysm Father    Colon cancer Neg Hx    Stomach cancer Neg Hx    Rectal cancer Neg Hx    Esophageal cancer Neg Hx    Liver cancer Neg Hx     Objective:  *** General: No acute distress.  Patient appears well-groomed.   Head:  Normocephalic/atraumatic Eyes:  fundi examined but not visualized Neck: supple, no paraspinal tenderness, full range of motion Heart: regular rate and rhythm Neurological Exam: Mental status: alert and oriented to person, place, and time, speech fluent and not dysarthric, language intact. Cranial nerves: CN I: not tested CN II: pupils equal, round and reactive to light, visual fields intact CN III, IV, VI:  full range of motion, no nystagmus, no ptosis CN V: facial sensation intact. CN VII: upper and lower face symmetric CN VIII: hearing intact CN IX, X: gag intact, uvula midline CN XI: sternocleidomastoid and trapezius muscles intact CN XII: tongue midline Bulk & Tone: normal, no fasciculations. Motor:  muscle strength 5/5 throughout Sensation:  Pinprick and vibratory sensation intact. Deep Tendon Reflexes:  2+ throughout,  toes downgoing.   Finger to nose testing:  Without dysmetria.   Gait:  Normal station and stride.  Romberg negative.    Juliene Dunnings, DO  CC: ***        [1]  Current Outpatient  Medications on File Prior to Visit  Medication Sig Dispense Refill   allopurinol (ZYLOPRIM) 100 MG tablet 2 (two) times daily.      Dextrose-Fructose-Sod Citrate (NAUZENE PO) Take by mouth. 2 chews Prn nausea (Patient not taking: Reported on 12/23/2024)     Fish Oil OIL Take 1,000 mg by mouth.     folic acid  (FOLVITE ) 1 MG tablet Take 2 mg by mouth daily.     lisinopril  (ZESTRIL ) 5 MG tablet TAKE 1/2 TABLET EVERY DAY 45 tablet 3   Magnesium 200 MG TABS Take 250 mg by mouth daily.     methotrexate  (RHEUMATREX) 2.5 MG tablet 6 pills once a week on fridays 24 tablet 2   Multiple  Vitamins-Minerals (CENTRUM SILVER PO) Take by mouth daily.     Multiple Vitamins-Minerals (ICAPS AREDS 2 PO) Take by mouth. 1 am and 1 qhs     omeprazole  (PRILOSEC) 40 MG capsule Take 1 capsule (40 mg total) by mouth daily. 30 capsule 1   rosuvastatin  (CRESTOR ) 5 MG tablet TAKE 1 TABLET EVERY DAY 90 tablet 3   No current facility-administered medications on file prior to visit.  [2] No Known Allergies  "

## 2025-01-08 ENCOUNTER — Ambulatory Visit: Payer: Self-pay

## 2025-01-08 ENCOUNTER — Ambulatory Visit: Admitting: Neurology

## 2025-01-26 ENCOUNTER — Ambulatory Visit: Admitting: Neurology

## 2025-01-26 ENCOUNTER — Ambulatory Visit

## 2025-02-26 ENCOUNTER — Ambulatory Visit

## 2025-04-27 ENCOUNTER — Ambulatory Visit: Admitting: Family Medicine
